# Patient Record
Sex: Female | Born: 1945 | Race: White | Hispanic: No | Marital: Married | Smoking: Never smoker
Health system: Southern US, Community
[De-identification: ages and names within clinical notes are randomized; demographics above are authoritative.]

## PROBLEM LIST (undated history)

## (undated) DIAGNOSIS — D649 Anemia, unspecified: Secondary | ICD-10-CM

## (undated) DIAGNOSIS — E7800 Pure hypercholesterolemia, unspecified: Secondary | ICD-10-CM

## (undated) DIAGNOSIS — R0602 Shortness of breath: Secondary | ICD-10-CM

## (undated) DIAGNOSIS — I10 Essential (primary) hypertension: Secondary | ICD-10-CM

## (undated) DIAGNOSIS — K219 Gastro-esophageal reflux disease without esophagitis: Secondary | ICD-10-CM

## (undated) DIAGNOSIS — D0510 Intraductal carcinoma in situ of unspecified breast: Secondary | ICD-10-CM

## (undated) DIAGNOSIS — A4902 Methicillin resistant Staphylococcus aureus infection, unspecified site: Secondary | ICD-10-CM

## (undated) DIAGNOSIS — M549 Dorsalgia, unspecified: Secondary | ICD-10-CM

## (undated) DIAGNOSIS — C50919 Malignant neoplasm of unspecified site of unspecified female breast: Secondary | ICD-10-CM

## (undated) DIAGNOSIS — E039 Hypothyroidism, unspecified: Secondary | ICD-10-CM

## (undated) DIAGNOSIS — Z923 Personal history of irradiation: Secondary | ICD-10-CM

## (undated) DIAGNOSIS — E119 Type 2 diabetes mellitus without complications: Secondary | ICD-10-CM

## (undated) DIAGNOSIS — E222 Syndrome of inappropriate secretion of antidiuretic hormone: Secondary | ICD-10-CM

## (undated) DIAGNOSIS — R000 Tachycardia, unspecified: Secondary | ICD-10-CM

## (undated) DIAGNOSIS — J45909 Unspecified asthma, uncomplicated: Secondary | ICD-10-CM

## (undated) DIAGNOSIS — S299XXA Unspecified injury of thorax, initial encounter: Secondary | ICD-10-CM

## (undated) DIAGNOSIS — E063 Autoimmune thyroiditis: Secondary | ICD-10-CM

## (undated) DIAGNOSIS — E669 Obesity, unspecified: Secondary | ICD-10-CM

## (undated) HISTORY — PX: MASTECTOMY, PARTIAL: SHX709

## (undated) HISTORY — DX: Anemia, unspecified: D64.9

## (undated) HISTORY — PX: COLONOSCOPY WITH ESOPHAGOGASTRODUODENOSCOPY (EGD): SHX5779

## (undated) HISTORY — DX: Dorsalgia, unspecified: M54.9

## (undated) HISTORY — DX: Unspecified asthma, uncomplicated: J45.909

## (undated) HISTORY — PX: OTHER SURGICAL HISTORY: SHX169

## (undated) HISTORY — DX: Type 2 diabetes mellitus without complications: E11.9

## (undated) HISTORY — DX: Gastro-esophageal reflux disease without esophagitis: K21.9

## (undated) HISTORY — PX: THYROIDECTOMY, PARTIAL: SHX18

## (undated) HISTORY — DX: Essential (primary) hypertension: I10

## (undated) HISTORY — DX: Tachycardia, unspecified: R00.0

## (undated) HISTORY — DX: Malignant neoplasm of unspecified site of unspecified female breast: C50.919

## (undated) HISTORY — DX: Hypothyroidism, unspecified: E03.9

## (undated) HISTORY — PX: ABDOMINAL HYSTERECTOMY: SHX81

## (undated) HISTORY — PX: REPLACEMENT TOTAL KNEE: SUR1224

## (undated) HISTORY — PX: MASTECTOMY: SHX3

## (undated) HISTORY — PX: TONSILLECTOMY: SUR1361

---

## 2005-11-04 ENCOUNTER — Ambulatory Visit: Payer: Self-pay

## 2006-01-27 ENCOUNTER — Ambulatory Visit: Payer: Self-pay | Admitting: Unknown Physician Specialty

## 2007-10-26 ENCOUNTER — Ambulatory Visit: Payer: Self-pay

## 2008-12-11 ENCOUNTER — Emergency Department: Payer: Self-pay | Admitting: Emergency Medicine

## 2010-09-12 ENCOUNTER — Emergency Department: Payer: Self-pay | Admitting: Emergency Medicine

## 2011-02-27 ENCOUNTER — Observation Stay: Payer: Self-pay | Admitting: Internal Medicine

## 2011-02-27 DIAGNOSIS — I369 Nonrheumatic tricuspid valve disorder, unspecified: Secondary | ICD-10-CM

## 2011-02-27 DIAGNOSIS — R079 Chest pain, unspecified: Secondary | ICD-10-CM

## 2011-04-07 ENCOUNTER — Ambulatory Visit: Payer: Self-pay | Admitting: General Practice

## 2011-04-09 ENCOUNTER — Ambulatory Visit: Payer: Self-pay | Admitting: Unknown Physician Specialty

## 2011-04-21 ENCOUNTER — Inpatient Hospital Stay: Payer: Self-pay | Admitting: General Practice

## 2011-04-25 ENCOUNTER — Encounter: Payer: Self-pay | Admitting: Internal Medicine

## 2011-04-29 ENCOUNTER — Encounter: Payer: Self-pay | Admitting: Internal Medicine

## 2011-05-10 ENCOUNTER — Emergency Department: Payer: Self-pay | Admitting: Emergency Medicine

## 2011-10-14 ENCOUNTER — Ambulatory Visit: Payer: Self-pay | Admitting: Internal Medicine

## 2012-09-07 ENCOUNTER — Ambulatory Visit: Payer: Self-pay | Admitting: General Practice

## 2012-09-07 DIAGNOSIS — I10 Essential (primary) hypertension: Secondary | ICD-10-CM

## 2012-09-07 LAB — BASIC METABOLIC PANEL
Anion Gap: 11 (ref 7–16)
BUN: 13 mg/dL (ref 7–18)
Chloride: 96 mmol/L — ABNORMAL LOW (ref 98–107)
Creatinine: 0.71 mg/dL (ref 0.60–1.30)
EGFR (African American): >60
Glucose: 160 mg/dL — ABNORMAL HIGH (ref 65–99)
Osmolality: 266 (ref 275–301)
Sodium: 131 mmol/L — ABNORMAL LOW (ref 136–145)

## 2012-09-07 LAB — URINALYSIS, COMPLETE
Bacteria: NONE SEEN
Glucose,UR: NEGATIVE mg/dL (ref 0–75)
Nitrite: NEGATIVE
Specific Gravity: 1.014 (ref 1.003–1.030)
Squamous Epithelial: <1
WBC UR: 1 /HPF (ref 0–5)

## 2012-09-07 LAB — CBC
HCT: 32.7 % — ABNORMAL LOW (ref 35.0–47.0)
HGB: 11.3 g/dL — ABNORMAL LOW (ref 12.0–16.0)
MCV: 81 fL (ref 80–100)
RBC: 4.03 10*6/uL (ref 3.80–5.20)
RDW: 12.7 % (ref 11.5–14.5)
WBC: 7.4 10*3/uL (ref 3.6–11.0)

## 2012-09-07 LAB — PROTIME-INR: INR: 0.9

## 2012-09-07 LAB — APTT: Activated PTT: 29.6 secs (ref 23.6–35.9)

## 2012-09-07 LAB — MRSA PCR SCREENING

## 2012-09-20 ENCOUNTER — Inpatient Hospital Stay: Payer: Self-pay | Admitting: General Practice

## 2012-09-21 LAB — BASIC METABOLIC PANEL
Anion Gap: 10 (ref 7–16)
BUN: 9 mg/dL (ref 7–18)
Chloride: 102 mmol/L (ref 98–107)
Creatinine: 0.67 mg/dL (ref 0.60–1.30)
EGFR (African American): >60
EGFR (Non-African Amer.): >60
Glucose: 118 mg/dL — ABNORMAL HIGH (ref 65–99)
Osmolality: 272 (ref 275–301)

## 2012-09-21 LAB — PLATELET COUNT: Platelet: 296 10*3/uL (ref 150–440)

## 2012-09-21 LAB — HEMOGLOBIN: HGB: 9.6 g/dL — ABNORMAL LOW (ref 12.0–16.0)

## 2012-09-22 LAB — BASIC METABOLIC PANEL
Anion Gap: 9 (ref 7–16)
BUN: 5 mg/dL — ABNORMAL LOW (ref 7–18)
Calcium, Total: 8.8 mg/dL (ref 8.5–10.1)
Chloride: 103 mmol/L (ref 98–107)
EGFR (African American): >60
EGFR (Non-African Amer.): >60
Glucose: 113 mg/dL — ABNORMAL HIGH (ref 65–99)
Osmolality: 274 (ref 275–301)

## 2012-09-22 LAB — PLATELET COUNT: Platelet: 318 10*3/uL (ref 150–440)

## 2012-09-24 ENCOUNTER — Encounter: Payer: Self-pay | Admitting: Internal Medicine

## 2012-09-28 ENCOUNTER — Encounter: Payer: Self-pay | Admitting: Internal Medicine

## 2012-11-03 ENCOUNTER — Ambulatory Visit: Payer: Self-pay | Admitting: General Practice

## 2012-11-22 ENCOUNTER — Ambulatory Visit: Payer: Self-pay | Admitting: Internal Medicine

## 2012-12-29 DIAGNOSIS — C50919 Malignant neoplasm of unspecified site of unspecified female breast: Secondary | ICD-10-CM

## 2012-12-29 HISTORY — DX: Malignant neoplasm of unspecified site of unspecified female breast: C50.919

## 2013-04-20 ENCOUNTER — Inpatient Hospital Stay: Payer: Self-pay | Admitting: Internal Medicine

## 2013-04-20 LAB — CBC
HCT: 38.4 % (ref 35.0–47.0)
MCH: 28.3 pg (ref 26.0–34.0)
MCV: 85 fL (ref 80–100)
RBC: 4.5 10*6/uL (ref 3.80–5.20)
RDW: 12.5 % (ref 11.5–14.5)
WBC: 9.1 10*3/uL (ref 3.6–11.0)

## 2013-04-20 LAB — COMPREHENSIVE METABOLIC PANEL
Albumin: 3.5 g/dL (ref 3.4–5.0)
Alkaline Phosphatase: 109 U/L (ref 50–136)
BUN: 19 mg/dL — ABNORMAL HIGH (ref 7–18)
Calcium, Total: 8.7 mg/dL (ref 8.5–10.1)
Chloride: 95 mmol/L — ABNORMAL LOW (ref 98–107)
Co2: 25 mmol/L (ref 21–32)
EGFR (Non-African Amer.): 44 — ABNORMAL LOW
Potassium: 3.6 mmol/L (ref 3.5–5.1)
SGOT(AST): 20 U/L (ref 15–37)
Sodium: 129 mmol/L — ABNORMAL LOW (ref 136–145)
Total Protein: 7.7 g/dL (ref 6.4–8.2)

## 2013-04-20 LAB — URINALYSIS, COMPLETE
Bacteria: NONE SEEN
Bilirubin,UR: NEGATIVE
Blood: NEGATIVE
Glucose,UR: >=500 mg/dL (ref 0–75)
Ketone: NEGATIVE
Nitrite: NEGATIVE
Ph: 5 (ref 4.5–8.0)
Protein: NEGATIVE
Specific Gravity: 1.027 (ref 1.003–1.030)
WBC UR: 6 /HPF (ref 0–5)

## 2013-04-20 LAB — BETA-HYDROXYBUTYRIC ACID: Beta-Hydroxybutyrate: 1.95 mg/dL (ref 0.2–2.8)

## 2013-04-21 LAB — CBC WITH DIFFERENTIAL/PLATELET
Eosinophil #: 0.4 10*3/uL (ref 0.0–0.7)
Eosinophil %: 5.8 %
HGB: 11.9 g/dL — ABNORMAL LOW (ref 12.0–16.0)
Lymphocyte %: 35.2 %
MCH: 28.1 pg (ref 26.0–34.0)
Monocyte #: 0.5 x10 3/mm (ref 0.2–0.9)
Neutrophil #: 3.8 10*3/uL (ref 1.4–6.5)
Platelet: 251 10*3/uL (ref 150–440)
RDW: 12.4 % (ref 11.5–14.5)

## 2013-04-21 LAB — BASIC METABOLIC PANEL
Anion Gap: 5 — ABNORMAL LOW (ref 7–16)
Chloride: 106 mmol/L (ref 98–107)
Co2: 29 mmol/L (ref 21–32)
Creatinine: 0.63 mg/dL (ref 0.60–1.30)
EGFR (African American): >60
EGFR (Non-African Amer.): >60
Osmolality: 282 (ref 275–301)
Potassium: 3.1 mmol/L — ABNORMAL LOW (ref 3.5–5.1)
Sodium: 140 mmol/L (ref 136–145)

## 2013-04-21 LAB — MAGNESIUM: Magnesium: 1.3 mg/dL — ABNORMAL LOW

## 2013-04-21 LAB — HEMOGLOBIN A1C: Hemoglobin A1C: 12.3 % — ABNORMAL HIGH (ref 4.2–6.3)

## 2013-04-22 LAB — BASIC METABOLIC PANEL
Anion Gap: 6 — ABNORMAL LOW (ref 7–16)
Calcium, Total: 8.6 mg/dL (ref 8.5–10.1)
Chloride: 109 mmol/L — ABNORMAL HIGH (ref 98–107)
Co2: 25 mmol/L (ref 21–32)
EGFR (African American): >60
EGFR (Non-African Amer.): >60
Potassium: 3.8 mmol/L (ref 3.5–5.1)

## 2013-05-13 ENCOUNTER — Ambulatory Visit: Payer: Self-pay | Admitting: Internal Medicine

## 2013-05-29 ENCOUNTER — Ambulatory Visit: Payer: Self-pay | Admitting: Internal Medicine

## 2013-07-27 ENCOUNTER — Ambulatory Visit: Payer: Self-pay | Admitting: Internal Medicine

## 2013-07-29 ENCOUNTER — Ambulatory Visit: Payer: Self-pay | Admitting: Internal Medicine

## 2013-08-29 ENCOUNTER — Ambulatory Visit: Payer: Self-pay | Admitting: Internal Medicine

## 2013-09-19 ENCOUNTER — Ambulatory Visit: Payer: Self-pay | Admitting: Otolaryngology

## 2013-10-03 ENCOUNTER — Ambulatory Visit: Payer: Self-pay | Admitting: Otolaryngology

## 2013-11-23 ENCOUNTER — Ambulatory Visit: Payer: Self-pay | Admitting: Internal Medicine

## 2013-11-28 HISTORY — PX: BREAST BIOPSY: SHX20

## 2013-12-05 ENCOUNTER — Ambulatory Visit: Payer: Self-pay | Admitting: Internal Medicine

## 2013-12-12 ENCOUNTER — Ambulatory Visit: Payer: Self-pay | Admitting: Internal Medicine

## 2013-12-16 LAB — PATHOLOGY REPORT

## 2013-12-27 ENCOUNTER — Ambulatory Visit: Payer: Self-pay | Admitting: Surgery

## 2013-12-27 LAB — CBC
HCT: 34.1 % — ABNORMAL LOW (ref 35.0–47.0)
HGB: 11.6 g/dL — ABNORMAL LOW (ref 12.0–16.0)
MCHC: 34.1 g/dL (ref 32.0–36.0)
MCV: 81 fL (ref 80–100)
Platelet: 368 10*3/uL (ref 150–440)
RBC: 4.22 10*6/uL (ref 3.80–5.20)
RDW: 12.6 % (ref 11.5–14.5)

## 2013-12-27 LAB — BASIC METABOLIC PANEL
Anion Gap: 6 — ABNORMAL LOW (ref 7–16)
BUN: 14 mg/dL (ref 7–18)
Calcium, Total: 9.3 mg/dL (ref 8.5–10.1)
Chloride: 95 mmol/L — ABNORMAL LOW (ref 98–107)
Co2: 27 mmol/L (ref 21–32)
Creatinine: 0.81 mg/dL (ref 0.60–1.30)
Potassium: 3.5 mmol/L (ref 3.5–5.1)

## 2013-12-29 DIAGNOSIS — Z923 Personal history of irradiation: Secondary | ICD-10-CM

## 2013-12-29 HISTORY — DX: Personal history of irradiation: Z92.3

## 2013-12-29 HISTORY — PX: BREAST LUMPECTOMY: SHX2

## 2014-01-05 ENCOUNTER — Ambulatory Visit: Payer: Self-pay | Admitting: Surgery

## 2014-01-05 DIAGNOSIS — D0511 Intraductal carcinoma in situ of right breast: Secondary | ICD-10-CM | POA: Insufficient documentation

## 2014-01-06 ENCOUNTER — Emergency Department: Payer: Self-pay | Admitting: Emergency Medicine

## 2014-01-09 LAB — PATHOLOGY REPORT

## 2014-01-12 ENCOUNTER — Ambulatory Visit: Payer: Self-pay | Admitting: Hematology and Oncology

## 2014-01-17 ENCOUNTER — Ambulatory Visit: Payer: Self-pay | Admitting: Hematology and Oncology

## 2014-01-29 ENCOUNTER — Ambulatory Visit: Payer: Self-pay | Admitting: Hematology and Oncology

## 2014-02-20 LAB — CBC CANCER CENTER
BASOS PCT: 0.7 %
Basophil #: 0.1 x10 3/mm (ref 0.0–0.1)
Eosinophil #: 0.2 x10 3/mm (ref 0.0–0.7)
Eosinophil %: 2.5 %
HCT: 35.4 % (ref 35.0–47.0)
HGB: 11.7 g/dL — ABNORMAL LOW (ref 12.0–16.0)
Lymphocyte #: 1.6 x10 3/mm (ref 1.0–3.6)
Lymphocyte %: 16 %
MCH: 26.9 pg (ref 26.0–34.0)
MCHC: 33.1 g/dL (ref 32.0–36.0)
MCV: 81 fL (ref 80–100)
MONOS PCT: 6.3 %
Monocyte #: 0.6 x10 3/mm (ref 0.2–0.9)
NEUTROS ABS: 7.3 x10 3/mm — AB (ref 1.4–6.5)
Neutrophil %: 74.5 %
Platelet: 359 x10 3/mm (ref 150–440)
RBC: 4.35 10*6/uL (ref 3.80–5.20)
RDW: 12.7 % (ref 11.5–14.5)
WBC: 9.8 x10 3/mm (ref 3.6–11.0)

## 2014-02-26 ENCOUNTER — Ambulatory Visit: Payer: Self-pay | Admitting: Hematology and Oncology

## 2014-02-27 LAB — CBC CANCER CENTER
Basophil #: 0.1 x10 3/mm (ref 0.0–0.1)
Basophil %: 0.9 %
EOS PCT: 4.3 %
Eosinophil #: 0.4 x10 3/mm (ref 0.0–0.7)
HCT: 37.2 % (ref 35.0–47.0)
HGB: 12.3 g/dL (ref 12.0–16.0)
LYMPHS ABS: 1.8 x10 3/mm (ref 1.0–3.6)
Lymphocyte %: 18.3 %
MCH: 26.9 pg (ref 26.0–34.0)
MCHC: 33.2 g/dL (ref 32.0–36.0)
MCV: 81 fL (ref 80–100)
Monocyte #: 0.8 x10 3/mm (ref 0.2–0.9)
Monocyte %: 8 %
NEUTROS PCT: 68.5 %
Neutrophil #: 6.7 x10 3/mm — ABNORMAL HIGH (ref 1.4–6.5)
Platelet: 405 x10 3/mm (ref 150–440)
RBC: 4.59 10*6/uL (ref 3.80–5.20)
RDW: 12.9 % (ref 11.5–14.5)
WBC: 9.8 x10 3/mm (ref 3.6–11.0)

## 2014-02-28 LAB — COMPREHENSIVE METABOLIC PANEL
AST: 11 U/L — AB (ref 15–37)
Albumin: 3.5 g/dL (ref 3.4–5.0)
Alkaline Phosphatase: 64 U/L
Anion Gap: 10 (ref 7–16)
BUN: 14 mg/dL (ref 7–18)
Bilirubin,Total: 0.3 mg/dL (ref 0.2–1.0)
CALCIUM: 8.6 mg/dL (ref 8.5–10.1)
Chloride: 91 mmol/L — ABNORMAL LOW (ref 98–107)
Co2: 27 mmol/L (ref 21–32)
Creatinine: 0.86 mg/dL (ref 0.60–1.30)
EGFR (African American): >60
EGFR (Non-African Amer.): >60
Glucose: 120 mg/dL — ABNORMAL HIGH (ref 65–99)
Osmolality: 259 (ref 275–301)
Potassium: 4.3 mmol/L (ref 3.5–5.1)
SGPT (ALT): 14 U/L (ref 12–78)
Sodium: 128 mmol/L — ABNORMAL LOW (ref 136–145)
TOTAL PROTEIN: 7.9 g/dL (ref 6.4–8.2)

## 2014-03-06 DIAGNOSIS — IMO0002 Reserved for concepts with insufficient information to code with codable children: Secondary | ICD-10-CM | POA: Insufficient documentation

## 2014-03-06 DIAGNOSIS — E119 Type 2 diabetes mellitus without complications: Secondary | ICD-10-CM | POA: Insufficient documentation

## 2014-03-06 DIAGNOSIS — E041 Nontoxic single thyroid nodule: Secondary | ICD-10-CM | POA: Insufficient documentation

## 2014-03-06 DIAGNOSIS — M1710 Unilateral primary osteoarthritis, unspecified knee: Secondary | ICD-10-CM | POA: Insufficient documentation

## 2014-03-06 DIAGNOSIS — I10 Essential (primary) hypertension: Secondary | ICD-10-CM | POA: Insufficient documentation

## 2014-03-06 DIAGNOSIS — E7800 Pure hypercholesterolemia, unspecified: Secondary | ICD-10-CM | POA: Insufficient documentation

## 2014-03-06 DIAGNOSIS — E039 Hypothyroidism, unspecified: Secondary | ICD-10-CM | POA: Insufficient documentation

## 2014-03-06 DIAGNOSIS — D510 Vitamin B12 deficiency anemia due to intrinsic factor deficiency: Secondary | ICD-10-CM | POA: Insufficient documentation

## 2014-03-06 DIAGNOSIS — Z794 Long term (current) use of insulin: Secondary | ICD-10-CM

## 2014-03-06 DIAGNOSIS — D649 Anemia, unspecified: Secondary | ICD-10-CM | POA: Insufficient documentation

## 2014-03-06 LAB — CBC CANCER CENTER
Basophil #: 0.1 x10 3/mm (ref 0.0–0.1)
Basophil %: 0.6 %
EOS ABS: 0.4 x10 3/mm (ref 0.0–0.7)
Eosinophil %: 4.4 %
HCT: 36.3 % (ref 35.0–47.0)
HGB: 12.2 g/dL (ref 12.0–16.0)
LYMPHS ABS: 1.4 x10 3/mm (ref 1.0–3.6)
Lymphocyte %: 14.8 %
MCH: 27.3 pg (ref 26.0–34.0)
MCHC: 33.6 g/dL (ref 32.0–36.0)
MCV: 81 fL (ref 80–100)
Monocyte #: 0.6 x10 3/mm (ref 0.2–0.9)
Monocyte %: 6.9 %
NEUTROS ABS: 6.7 x10 3/mm — AB (ref 1.4–6.5)
Neutrophil %: 73.3 %
Platelet: 362 x10 3/mm (ref 150–440)
RBC: 4.46 10*6/uL (ref 3.80–5.20)
RDW: 12.9 % (ref 11.5–14.5)
WBC: 9.1 x10 3/mm (ref 3.6–11.0)

## 2014-03-13 LAB — CBC CANCER CENTER
Basophil #: 0.1 x10 3/mm (ref 0.0–0.1)
Basophil %: 0.7 %
EOS PCT: 2.5 %
Eosinophil #: 0.2 x10 3/mm (ref 0.0–0.7)
HCT: 35.5 % (ref 35.0–47.0)
HGB: 12 g/dL (ref 12.0–16.0)
Lymphocyte #: 1.3 x10 3/mm (ref 1.0–3.6)
Lymphocyte %: 13.7 %
MCH: 27.7 pg (ref 26.0–34.0)
MCHC: 33.8 g/dL (ref 32.0–36.0)
MCV: 82 fL (ref 80–100)
MONO ABS: 0.6 x10 3/mm (ref 0.2–0.9)
MONOS PCT: 6.4 %
NEUTROS ABS: 7.2 x10 3/mm — AB (ref 1.4–6.5)
Neutrophil %: 76.7 %
Platelet: 308 x10 3/mm (ref 150–440)
RBC: 4.33 10*6/uL (ref 3.80–5.20)
RDW: 13 % (ref 11.5–14.5)
WBC: 9.4 x10 3/mm (ref 3.6–11.0)

## 2014-03-20 LAB — CBC CANCER CENTER
BASOS PCT: 0.5 %
Basophil #: 0 x10 3/mm (ref 0.0–0.1)
EOS ABS: 0.2 x10 3/mm (ref 0.0–0.7)
EOS PCT: 2.4 %
HCT: 36.8 % (ref 35.0–47.0)
HGB: 12.3 g/dL (ref 12.0–16.0)
LYMPHS ABS: 1.1 x10 3/mm (ref 1.0–3.6)
Lymphocyte %: 12.6 %
MCH: 27.2 pg (ref 26.0–34.0)
MCHC: 33.3 g/dL (ref 32.0–36.0)
MCV: 82 fL (ref 80–100)
MONO ABS: 0.6 x10 3/mm (ref 0.2–0.9)
Monocyte %: 7.2 %
NEUTROS ABS: 6.8 x10 3/mm — AB (ref 1.4–6.5)
NEUTROS PCT: 77.3 %
Platelet: 326 x10 3/mm (ref 150–440)
RBC: 4.52 10*6/uL (ref 3.80–5.20)
RDW: 12.9 % (ref 11.5–14.5)
WBC: 8.8 x10 3/mm (ref 3.6–11.0)

## 2014-03-27 LAB — CBC CANCER CENTER
BASOS PCT: 0.8 %
Basophil #: 0.1 x10 3/mm (ref 0.0–0.1)
Eosinophil #: 0.2 x10 3/mm (ref 0.0–0.7)
Eosinophil %: 2.7 %
HCT: 35.9 % (ref 35.0–47.0)
HGB: 12 g/dL (ref 12.0–16.0)
Lymphocyte #: 1.1 x10 3/mm (ref 1.0–3.6)
Lymphocyte %: 13.7 %
MCH: 27 pg (ref 26.0–34.0)
MCHC: 33.3 g/dL (ref 32.0–36.0)
MCV: 81 fL (ref 80–100)
MONOS PCT: 7.8 %
Monocyte #: 0.7 x10 3/mm (ref 0.2–0.9)
NEUTROS ABS: 6.3 x10 3/mm (ref 1.4–6.5)
Neutrophil %: 75 %
Platelet: 341 x10 3/mm (ref 150–440)
RBC: 4.43 10*6/uL (ref 3.80–5.20)
RDW: 12.8 % (ref 11.5–14.5)
WBC: 8.4 x10 3/mm (ref 3.6–11.0)

## 2014-03-29 ENCOUNTER — Ambulatory Visit: Payer: Self-pay | Admitting: Hematology and Oncology

## 2014-04-18 LAB — CBC CANCER CENTER
Basophil #: 0.1 10*3/uL
Basophil %: 0.9 %
Eosinophil #: 0.2 10*3/uL
Eosinophil %: 2.2 %
HCT: 36.6 %
HGB: 12.3 g/dL
Lymphocyte %: 15.2 %
Lymphs Abs: 1.1 10*3/uL
MCH: 27.8 pg
MCHC: 33.6 g/dL
MCV: 83 fL
Monocyte #: 0.6 10*3/uL
Monocyte %: 7.4 %
Neutrophil #: 5.6 10*3/uL
Neutrophil %: 74.3 %
Platelet: 341 10*3/uL
RBC: 4.43 10*6/uL
RDW: 12.7 %
WBC: 7.5 10*3/uL

## 2014-04-18 LAB — COMPREHENSIVE METABOLIC PANEL
ANION GAP: 6 — AB (ref 7–16)
AST: 11 U/L — AB (ref 15–37)
Albumin: 3.5 g/dL (ref 3.4–5.0)
Alkaline Phosphatase: 74 U/L
BUN: 14 mg/dL (ref 7–18)
Bilirubin,Total: 0.4 mg/dL (ref 0.2–1.0)
Calcium, Total: 9.1 mg/dL (ref 8.5–10.1)
Chloride: 93 mmol/L — ABNORMAL LOW (ref 98–107)
Co2: 28 mmol/L (ref 21–32)
Creatinine: 0.66 mg/dL (ref 0.60–1.30)
EGFR (African American): >60
Glucose: 178 mg/dL — ABNORMAL HIGH (ref 65–99)
Osmolality: 260 (ref 275–301)
POTASSIUM: 3.9 mmol/L (ref 3.5–5.1)
SGPT (ALT): 18 U/L (ref 12–78)
SODIUM: 127 mmol/L — AB (ref 136–145)
TOTAL PROTEIN: 8.2 g/dL (ref 6.4–8.2)

## 2014-04-28 ENCOUNTER — Ambulatory Visit: Payer: Self-pay | Admitting: Hematology and Oncology

## 2014-05-15 ENCOUNTER — Ambulatory Visit: Payer: Self-pay | Admitting: Neurology

## 2014-08-18 ENCOUNTER — Ambulatory Visit: Payer: Self-pay | Admitting: Family Medicine

## 2014-08-18 LAB — CBC CANCER CENTER
BASOS ABS: 0.1 x10 3/mm (ref 0.0–0.1)
Basophil %: 0.7 %
EOS ABS: 0.2 x10 3/mm (ref 0.0–0.7)
Eosinophil %: 2.3 %
HCT: 35.3 % (ref 35.0–47.0)
HGB: 12.1 g/dL (ref 12.0–16.0)
LYMPHS ABS: 1.4 x10 3/mm (ref 1.0–3.6)
LYMPHS PCT: 17.2 %
MCH: 27.9 pg (ref 26.0–34.0)
MCHC: 34.2 g/dL (ref 32.0–36.0)
MCV: 82 fL (ref 80–100)
Monocyte #: 0.6 x10 3/mm (ref 0.2–0.9)
Monocyte %: 7.7 %
NEUTROS PCT: 72.1 %
Neutrophil #: 6 x10 3/mm (ref 1.4–6.5)
Platelet: 388 x10 3/mm (ref 150–440)
RBC: 4.32 10*6/uL (ref 3.80–5.20)
RDW: 12.5 % (ref 11.5–14.5)
WBC: 8.3 x10 3/mm (ref 3.6–11.0)

## 2014-08-18 LAB — BASIC METABOLIC PANEL
Anion Gap: 9 (ref 7–16)
BUN: 12 mg/dL (ref 7–18)
CO2: 27 mmol/L (ref 21–32)
Calcium, Total: 8.8 mg/dL (ref 8.5–10.1)
Chloride: 91 mmol/L — ABNORMAL LOW (ref 98–107)
Creatinine: 0.75 mg/dL (ref 0.60–1.30)
EGFR (African American): >60
EGFR (Non-African Amer.): >60
Glucose: 131 mg/dL — ABNORMAL HIGH (ref 65–99)
Osmolality: 257 (ref 275–301)
Potassium: 4.3 mmol/L (ref 3.5–5.1)
Sodium: 127 mmol/L — ABNORMAL LOW (ref 136–145)

## 2014-08-29 ENCOUNTER — Ambulatory Visit: Payer: Self-pay | Admitting: Family Medicine

## 2014-11-14 ENCOUNTER — Ambulatory Visit: Payer: Self-pay | Admitting: Hematology and Oncology

## 2014-11-28 ENCOUNTER — Ambulatory Visit: Payer: Self-pay | Admitting: Hematology and Oncology

## 2014-12-13 ENCOUNTER — Ambulatory Visit: Payer: Self-pay | Admitting: Internal Medicine

## 2014-12-15 LAB — COMPREHENSIVE METABOLIC PANEL
Albumin: 3.3 g/dL — ABNORMAL LOW (ref 3.4–5.0)
Alkaline Phosphatase: 72 U/L
Anion Gap: 5 — ABNORMAL LOW (ref 7–16)
BILIRUBIN TOTAL: 0.3 mg/dL (ref 0.2–1.0)
BUN: 16 mg/dL (ref 7–18)
CALCIUM: 8.6 mg/dL (ref 8.5–10.1)
CHLORIDE: 100 mmol/L (ref 98–107)
CO2: 29 mmol/L (ref 21–32)
Creatinine: 0.79 mg/dL (ref 0.60–1.30)
EGFR (Non-African Amer.): >60
GLUCOSE: 195 mg/dL — AB (ref 65–99)
OSMOLALITY: 275 (ref 275–301)
POTASSIUM: 4.3 mmol/L (ref 3.5–5.1)
SGOT(AST): 9 U/L — ABNORMAL LOW (ref 15–37)
SGPT (ALT): 14 U/L
SODIUM: 134 mmol/L — AB (ref 136–145)
TOTAL PROTEIN: 7.3 g/dL (ref 6.4–8.2)

## 2014-12-15 LAB — CBC CANCER CENTER
Basophil #: 0.1 x10 3/mm (ref 0.0–0.1)
Basophil %: 0.8 %
EOS PCT: 3.3 %
Eosinophil #: 0.2 x10 3/mm (ref 0.0–0.7)
HCT: 34.9 % — AB (ref 35.0–47.0)
HGB: 11.5 g/dL — ABNORMAL LOW (ref 12.0–16.0)
LYMPHS PCT: 22.5 %
Lymphocyte #: 1.6 x10 3/mm (ref 1.0–3.6)
MCH: 27.6 pg (ref 26.0–34.0)
MCHC: 33 g/dL (ref 32.0–36.0)
MCV: 84 fL (ref 80–100)
MONO ABS: 0.5 x10 3/mm (ref 0.2–0.9)
MONOS PCT: 6.7 %
NEUTROS ABS: 4.8 x10 3/mm (ref 1.4–6.5)
Neutrophil %: 66.7 %
Platelet: 267 x10 3/mm (ref 150–440)
RBC: 4.17 10*6/uL (ref 3.80–5.20)
RDW: 12.6 % (ref 11.5–14.5)
WBC: 7.2 x10 3/mm (ref 3.6–11.0)

## 2014-12-18 LAB — CANCER ANTIGEN 27.29: CA 27.29: 15.2 U/mL (ref 0.0–38.6)

## 2014-12-29 ENCOUNTER — Ambulatory Visit: Payer: Self-pay | Admitting: Hematology and Oncology

## 2015-04-17 NOTE — Discharge Summary (Signed)
PATIENT NAME:  Hailey Hawkins, Hailey Hawkins MR#:  [id number] DATE OF BIRTH:  1946/08/24  DATE OF ADMISSION:  09/20/2012 DATE OF DISCHARGE:  09/23/2012  ADMITTING DIAGNOSIS: Degenerative arthrosis of the right knee.   DISCHARGE DIAGNOSIS: Degenerative arthrosis of the right knee.   HISTORY: The patient is a pleasant 69 year old who has been followed at [facility] for progression of right knee pain. The patient had reported long-standing history of bilateral knee pain. She had previously undergone a left total knee arthroplasty and had done well and was desiring to have the right one done. She denied any significant swelling in the knee, locking, or giving way. Most for her pain was localized along the medial aspect of the knee. The right knee pain had progressed to the point that it was significantly interfering with her activities of daily living. Her pain was noted be aggravated with weight-bearing activities. She was having difficulty with ascending and descending stairs. X-rays taken in [facility] showed significant narrowing to the medial cartilage space. There was subchondral sclerosis as well as osteophyte formation. After discussion of the risks and benefits of surgical intervention, the patient expressed her understanding of the risks and benefits and agreed with plans for surgical intervention.   PROCEDURE: Right total knee arthroplasty using computer-assisted navigation.   ANESTHESIA: Femoral nerve block with spinal.   IMPLANTS UTILIZED: DePuy PFC Sigma size 3 posterior stabilized femoral component (cemented), size 4 MBT revision tibial component (cemented), 35 mm three pegged oval dome patella (cemented), and a 10 mm stabilized rotating platform polyethylene insert.   HOSPITAL COURSE: The patient tolerated the procedure very well. She had no complications. She was then taken to the PAC-U where she was stabilized and then transferred to the orthopedic floor. The patient began  receiving anticoagulation therapy of Lovenox 40 mg every 12 hours per anesthesia and pharmacy protocol. She was fitted with TED stockings bilaterally. These were allowed to be removed one hour per eight hour shift. The right one was applied on day two following removal of the Hemovac and dressing change. She was also fitted with the AV-I compression foot pumps bilaterally set at 80 mmHg. Her calves have been nontender. There has been no evidence of any deep venous thromboses to the lower extremities. Heels were elevated off the bed using rolled towels. She has denied chest pain or shortness of breath.   Vital signs have been stable. She has been afebrile. Hemodynamically she was stable and no transfusions were given other than the Autovac transfusion given the first six hours postoperatively. Laboratory studies were felt to be at acceptable levels.  Physical therapy was initiated on day one for gait training and transfers. She has done very well and has progressed nicely. Occupational therapy was also initiated on day one for activities of daily living and assistive devices.   The patient's IV, Foley and Hemovac were discontinued on day two along with a dressing change. The wound was free of any drainage or signs of infection. Polar Care was reapplied to the surgical leg maintaining a temperature of 40 to 50 degrees Fahrenheit.   DISPOSITION: The patient is being discharged to a skilled nursing facility in improved stable condition.   DISCHARGE INSTRUCTIONS: She may weight bear as tolerated. Knee immobilizer until she is able to do 10 straight leg raises on her own. Continue TED stockings. These are allowed to be removed one hour per eight hour shift. Incentive spirometer every 1 hour while awake. Encourage cough and deep breathing every  two hours while awake. Polar Care to the surgical leg maintaining a temperature of 40 to 50 degrees Fahrenheit. Physical therapy for gait training and transfers.  Occupational therapy for activities of daily living and assistive devices. She is placed on an ADA diet. She has a follow-up appointment in the [facility] on 10/05/2012 at 11:00 a.m. with Vance Peper and on 11/02/2012 at 9:15 with Dr. Skip Estimable. She is to call the clinic sooner if there are any complications.   DRUG ALLERGIES: Birth control pills cause blood clots, amoxicillin, Cipro, codeine, penicillin, and animal dander-cat.  MEDICATIONS:  1. Tylenol 500 to 1000 mg every four hours p.r.n. for temperature greater than 100.4.  2. Roxicodone 5 to 10 mg every four hours p.r.n. for pain. 3. Tramadol 50 to 100 mg every four hours p.r.n. for pain. 4. Dulcolax suppository 10 mg rectally daily p.r.n. for constipation.  5. Milk of Magnesia 30 mL twice a day p.r.n.  6. Enema soapsuds if no results with milk of magnesia or Dulcolax.  7. Mylanta DS 30 mL every 6 hours p.r.n.  8. Senokot-S 1 tablet twice a day. 9. Glimepiride 2 mg with breakfast.  10. Insulin sliding scale Novolin R injections.  11. Advair Diskus 250/50 inhaler. 12. Synthroid 0.1 mg every 6:00 a.m.  13. Alprazolam 0.5 mg at bedtime.  14. Carafate 1 gram four times daily before meals.  15. Ferrous sulfate 325 mg twice a day with meals.  16. Albuterol inhaler 2 puff inhalation every four hours while awake. 17. OptiChamber. 18. Montelukast 10 mg at bedtime. 19. Prilosec 20 mg twice a day. 20. Benicar 40 mg daily. 21. Norvasc 5 mg daily. 22. Hydrochlorothiazide 12.5 mg daily.  23. Saline nasal spray one spray to both nostrils daily.  24. Lovenox 40 mg subcutaneous every 12 hours for 14 days then discontinue and begin taking one 81 mg enteric coated aspirin unless contraindicated.   PAST MEDICAL HISTORY:  1. Hypertension.  2. Gastroesophageal reflux disease. 3. Hashimoto disease.  4. Chickenpox.  5. Reactive gastropathy. 6. Blood clots in the legs.  7. Asthma.  8. Sensitive skin. 9. Substernal  goiter. 10. Hyperthyroidism.           11. Morbid obesity.  12. Diabetes.  ____________________________ Vance Peper, PA [id number] D: 09/23/2012 08:00:56 ET T: 09/23/2012 08:25:45 ET JOB#: [id number]  cc: Vance Peper, PA, <Dictator> Shenice Dolder PA ELECTRONICALLY SIGNED 09/25/2012 20:47

## 2015-04-17 NOTE — Op Note (Signed)
PATIENT NAME:  Hailey Hawkins, Hailey Hawkins MR#:  [id number] DATE OF BIRTH:  1946-06-27  DATE OF PROCEDURE:  09/20/2012  PREOPERATIVE DIAGNOSIS: Degenerative arthrosis of the right knee.   POSTOPERATIVE DIAGNOSIS: Degenerative arthrosis of the right knee.   PROCEDURE PERFORMED: Right total knee arthroplasty using computer-assisted navigation.   SURGEON: Laurice Record. Holley Bouche., MD   ASSISTANT: Vance Peper, PA-C (required to maintain retraction throughout the procedure)   ANESTHESIA: Femoral nerve block and spinal.   ESTIMATED BLOOD LOSS: 50 mL.   FLUIDS REPLACED: 2500 mL of Crystalloid.   TOURNIQUET TIME: 83 minutes.   DRAINS: Two medium drains to reinfusion system.   SOFT TISSUE RELEASES: Anterior cruciate ligament, posterior cruciate ligament, deep medial collateral ligament, and patellofemoral ligament.   IMPLANTS UTILIZED: DePuy PFC Sigma size 3 posterior stabilized femoral component (cemented), size 4 MBT revision tibial component (cemented), 35 mm three peg oval dome patella (cemented), and a 10 mm stabilized rotating platform polyethylene insert.   INDICATIONS FOR SURGERY: The patient is a 69 year old female who has been seen for complaints of progressive right knee pain. X-rays demonstrated severe degenerative changes in a tricompartmental fashion. She denies any significant improvement despite conservative nonsurgical intervention. After discussion of the risks and benefits of surgical intervention, the patient expressed her understanding of the risks and benefits and agreed with plans for surgical intervention.   PROCEDURE IN DETAIL: The patient was brought in the operating room and, after adequate femoral nerve block and spinal anesthesia was achieved, a tourniquet was placed on the patient's upper right thigh. The patient's right knee and leg were cleaned and prepped with alcohol and DuraPrep and draped in the usual sterile fashion. A "time-out" was performed as per usual protocol. The right  lower extremity was exsanguinated using an Esmarch and the tourniquet was inflated to 300 mmHg. An anterior longitudinal incision was made followed by a standard mid vastus approach. A moderate effusion was evacuated. Patella was subluxed laterally. The deep fibers of the medial collateral ligament were elevated in a subperiosteal fashion off the medial flare of the tibia so as to maintain a continuous soft tissue sleeve. Patellofemoral ligament was incised. Inspection of the knee demonstrated severe degenerative changes most notably to the medial compartment. Osteophytes were debrided using a rongeur. Anterior and posterior cruciate ligaments were excised. Two 4.0 mm Schanz pins were inserted into the femur and into the tibia for attachment of the array of spheres used for computer-assisted navigation. Hip center was identified using circumduction technique. Distal landmarks were mapped using the computer. Distal femur and proximal tibia were mapped using the computer. Distal femoral cutting guide was positioned using computer-assisted navigation so as to achieve a 5 degree distal valgus cut. Cut was performed and verified using the computer. Distal femur was sized and it was felt that a size 3 femoral component was appropriate. Size 3 cutting guide was positioned and the anterior cut was performed and verified using the computer. This was followed by completion of the posterior and chamfer cuts. Femoral cutting guide for central box was then positioned and central box cut was performed.   Attention was then directed to the proximal tibia. Medial and lateral menisci were excised. The extramedullary tibial cutting guide was positioned using computer-assisted navigation so as to achieve 0 degree varus valgus alignment and 2 degree posterior slope. Given the patient's high body mass index and relative osteopenia, it had been elected to proceed with preparing the tibia for a MBT revision tray. Proximal femoral cut  was performed and verified using the computer. Proximal femur was sized and it was felt that a size 4 tibial tray was appropriate. Tibial and femoral trials were inserted followed by insertion of a 10 mm polyethylene trial. Excellent mediolateral soft tissue balancing was appreciated both in extension and in flexion. Finally, patella was cut and prepared so as to accommodate a 35 mm three peg oval dome patella. Patellar trial was placed and the knee was placed through a range of motion with excellent patellar tracking appreciated.   Femoral trial was removed. Central post hole for the tibial component was reamed followed by insertion of a keel punch. Tibial trials were then removed. Cut surfaces of bone were irrigated with copious amounts of normal saline with antibiotic solution using pulsatile lavage and then suctioned dry. Polymethyl methacrylate cement with gentamicin was prepared in the usual fashion using a vacuum mixer. Cement was applied to the cut surface of the proximal tibia as well as along the undersurface of a size 4 MBT revision tibial tray. Tibial tray was positioned and impacted into place. Excess cement was removed using freer elevators. Next, cement was applied to the cut surface of the femur as well as along the posterior flanges of a size 3 posterior stabilized femoral component. Femoral component was positioned and impacted into place. Excess cement was removed using freer elevators. A 10 mm polyethylene trial was inserted and the knee was brought in full extension with steady axial compression applied. Finally, cement was applied to the backside of a 35 mm three peg oval dome patella and patellar component was positioned and patellar clamp applied. Excess cement was removed using freer elevators.   After adequate curing of cement, the tourniquet was deflated after a total tourniquet time of 83 minutes. Hemostasis was achieved using electrocautery. The knee was irrigated with copious  amounts of normal saline with antibiotic solution using pulsatile lavage and then suctioned dry. The knee was then inspected for any residual cement debris. 30 mL of 0.25% Marcaine with epinephrine was injected along the posterior capsule. A 10 mm stabilized rotating platform polyethylene insert was inserted and the knee was placed through a range of motion. Excellent mediolateral soft tissue balancing was appreciated and excellent patellar tracking appreciated. Two medium drains were placed in the wound bed and brought out through a separate stab incision to be attached to a reinfusion system. The medial parapatellar portion of the incision was reapproximated using interrupted sutures of #1 Vicryl. Subcutaneous tissue was approximated in layers using first #0 Vicryl followed by #2-0 Vicryl. Skin was closed with skin staples. Sterile dressing was applied.   The patient tolerated the procedure well. She was transported to the recovery room in stable condition.   ____________________________ Laurice Record. Holley Bouche., MD [id number] D: 09/20/2012 22:48:33 ET T: 09/21/2012 09:55:52 ET JOB#: [id number]  cc: Laurice Record. Holley Bouche., MD, <Dictator> JAMES P Holley Bouche MD ELECTRONICALLY SIGNED 09/21/2012 20:38

## 2015-04-20 NOTE — H&P (Signed)
PATIENT NAME:  Hailey Hawkins, Hailey Hawkins MR#:  [id number] DATE OF BIRTH:  07/10/1946  DATE OF ADMISSION:  04/20/2013  PRIMARY CARE PHYSICIAN:  Dr. Doy Hutching.   CHIEF COMPLAINT:  Not feeling well.   HISTORY OF PRESENT ILLNESS:  This is a 69 year old female who has noticed some yeast vaginally and under the breast and felt it in her mouth, raw and tender and a swollen gland.  She has had trouble with her distance vision for over a week and some balance issues.  She always has allergies with a cough and thought it was secondary to her allergies.  She went and saw the 14 assistant at the [facility] today and sent off some routine labs and her sugar was found to be over 700 and she was called to go in to the Emergency Room for further evaluation.  She never checks her sugars and did not think they were a problem.  She does take glimepiride for diabetes.  In the ER, she was found to have a sugar of greater than 600 and hospitalist services were contacted for further evaluation.   PAST MEDICAL HISTORY:  Diabetes, hypothyroidism, vocal cord paralysis, hypertension, arthritis, anemia, constipation and anxiety.   PAST SURGICAL HISTORY:  Partial thyroidectomy, bilateral knee replacements, hysterectomy, two C-sections.   ALLERGIES:  BIRTH CONTROL PILLS, AMOXICILLIN, CIPRO, CODEINE.   MEDICATIONS:  As per prescription writer include Advair Diskus 250/50 twice daily, albuterol CFC 2 puffs every 4 to 6 hours, aspirin 81 mg daily, Carafate 1 gram as needed heartburn, Colace 100 mg twice a day, ferrous sulfate 325 mg twice a day, glimepiride 2 mg daily, hydrochlorothiazide 25 mg daily, ibuprofen 200 mg every 4 hours, Singulair 10 mg at bedtime, omeprazole 20 mg daily, saline mist at bedtime nasally, Synthroid 100 mcg daily, tramadol as needed, vitamin B12 1000 mcg injection once a month, Xanax 0.5 mg at bedtime.   SOCIAL HISTORY:  No smoking.  No alcohol.  No drug use.  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   FAMILY HISTORY:  Mother died of some sort of blood disorder.  Father died of congestive heart failure.   REVIEW OF SYSTEMS:  CONSTITUTIONAL:  No fever, no chills.  Positive for hot feeling.  Positive for weight gain, 4 pounds.  EYES:  Does have blurry vision, especially with distance over the last week.  EARS, NOSE, MOUTH, AND THROAT:  Decreased hearing.  Positive for runny nose, positive for sore throat and difficulty with swallowing for the past week.  CARDIOVASCULAR:  No chest pain.  No palpitations.  RESPIRATORY:  Positive for shortness of breath.  Positive for cough, yellow phlegm.  No sputum.  No hemoptysis.  GASTROINTESTINAL:  Positive for constipation.  No bright red blood per rectum.  No melena.  No abdominal pain.  No nausea.  No vomiting.  GENITOURINARY:  Positive for burning on urination.  No hematuria.  MUSCULOSKELETAL:  Joints have been okay, recently taking ibuprofen.  NEUROLOGIC:  No fainting or blackouts, but balance has been a little bit off.  PSYCHIATRIC:  On medication for anxiety at night.  ENDOCRINE:  Positive for hypothyroidism.  HEMATOLOGIC AND LYMPHATIC:  Positive for anemia.   PHYSICAL EXAMINATION: VITAL SIGNS:  Temperature 97.8, pulse 92, respirations 18, blood pressure 116/88, pulse ox 96% on room air.  GENERAL:  No respiratory distress.  EYES:  Conjunctivae and lids normal.  Pupils equal, round, and reactive to light.  Extraocular muscles intact.  No nystagmus.  EARS, NOSE, MOUTH, AND  THROAT:  Tympanic membrane, no erythema.  Nasal mucosa, no erythema.  Throat, no erythema, no exudate seen.  No thrush in the mouth.  Lips and gums, no lesions. NECK:  No JVD.  No bruits.  No lymphadenopathy.  Slight thyromegaly on the left side.  No nodules felt.  LUNGS:  Clear to auscultation.  No use of accessory muscles to breathe.  No rhonchi, rales, or wheeze heard.  CARDIOVASCULAR:  S1, S2 normal.  No gallops, rubs, or murmurs heard.  Carotid upstroke 2+  bilaterally.  No bruits.  EXTREMITIES:  Dorsalis pedis pulses 2+ bilaterally.  No edema of the lower extremity.  ABDOMEN:  Soft, nontender.  No organomegaly/splenomegaly.  Normoactive bowel sounds.  No masses felt.  LYMPHATIC:  No lymph nodes in the neck.  MUSCULOSKELETAL:  No clubbing, edema or cyanosis.  SKIN:  Erythema under the breast.  NEUROLOGIC:  Cranial nerves II through XII grossly intact.  Deep tendon reflexes 2+ bilateral lower extremities.  PSYCHIATRIC:  The patient is oriented to person, place and time.   LABORATORY AND RADIOLOGICAL DATA:  Chest x-ray is negative.  Urinalysis, 1+ leukocyte esterase, greater than 500 mg/dL of glucose, beta hydroxybutyrate 1.95.  Glucose 654, BUN 19, creatinine 1.25, sodium 129, potassium 3.6, chloride 95, CO2 25, calcium 8.7.  Liver function tests normal range.  White blood cell count 9.1, hemoglobin and hematocrit 12.7 and 38.4, platelet count of 287.  Repeat glucose greater than 600.   ASSESSMENT AND PLAN: 1.  Hyperosmolar coma with uncontrolled diabetes type 2 with hyponatremia secondary to high sugars.  We will give IV fluid hydration, insulin drip as per non-diabetic ketoacidosis protocol.  We will admit to the ICU Stepdown with fingersticks q. 1 hour.  I did talk to the patient about her diabetes and checking her sugars on a 4 times a day basis now that she will probably be on insulin.  I will check a hemoglobin A1c.  We will check fingersticks q. 1 while here.  Continue to monitor closely.  2.  Fungal infection, skin under breast and vaginally.  We will give topical nystatin powder underneath the breast and Diflucan orally for vaginal fungal infection.  3.  Hypothyroidism.  Check a TSH.  Continue Synthroid.  4.  Gastroesophageal reflux disease.  Continue omeprazole.  5.  Hypertension.  Continue hydrochlorothiazide.  6.  Allergies, asthma, on Singulair, Advair and albuterol.  7.  We will send off a urine culture.  We will try to hold off on  antibiotics at this time.   TIME SPENT ON ADMISSION:  55 minutes.    ____________________________ Tana Conch. Leslye Peer, MD rjw:[id number] D: 04/20/2013 17:07:55 ET T: 04/20/2013 17:16:37 ET JOB#: [id number]  cc: Tana Conch. Leslye Peer, MD, <Dictator> Dr. Ilean China MD ELECTRONICALLY SIGNED 04/20/2013 20:19

## 2015-04-20 NOTE — Consult Note (Signed)
PATIENT NAME:  Hailey Hawkins, Hailey Hawkins MR#:  [id number] DATE OF BIRTH:  10-31-1946  DATE OF CONSULTATION:  04/21/2013  REFERRING PHYSICIAN: Adin Hector, MD  CONSULTING PHYSICIAN:  A. Lavone Orn, MD  CHIEF COMPLAINT: Uncontrolled diabetes.   HISTORY OF PRESENT ILLNESS: This is a 69 year old female with a history of well-controlled type 2 diabetes, who presented yesterday to internal medicine clinic with complaints of rash and was found to have severe hyperglycemia with a glucose of 716 and hemoglobin A1c of 12.5%. She was sent to the ED and admitted with hyperglycemia without ketosis. She reports she had been feeling bad for at least 2 to 3 months. She did receive a short course of steroids in January, none since. She received antibiotics in January and again about 2 weeks ago for sinusitis. She has had 2 weeks of polyuria and polydipsia. She denies weight loss or weight gain. She denies nausea. She has not been checking her blood sugars. She has been prescribed glimepiride, with which she states she has been compliant. After admission, she was treated with IV fluids and IV antibiotics. Blood sugars normalized, and she was transitioned to subcutaneous multiple daily injections of insulin this morning. She was given Lantus 30 units at 3:30 a.m., and her insulin drip was stopped at 6:30 a.m. For meals, she is getting NovoLog 10 units t.i.d. and then a NovoLog insulin sliding scale q.a.c. and at bedtime.   PAST MEDICAL HISTORY:  1. Type 2 diabetes.  2. Hypothyroidism.  3. Hypertension.  4. Morbid obesity.  5. Goiter.   PAST SURGICAL HISTORY:  1. Colon polyp. 2. Knee surgery.  3. C-section.  4. Tonsillectomy.  5. Hysterectomy.  6. Partial thyroidectomy.  SOCIAL HISTORY: The patient is married. Her husband is at the bedside. No tobacco use.   FAMILY HISTORY: Mother had DVTs and hypertension. Father had prostate cancer. Brother had prostate cancer. Father had diabetes.  ALLERGIES:  1.  ANTIHISTAMINES.  2. AUGMENTIN.  3. CODEINE.   REVIEW OF SYSTEMS:   GENERAL: No weight loss. Appetite good. No fevers.  HEENT: No blurred vision. Mild headaches. She has had a sore throat.  NECK: Denies neck pain or dysphagia.  CARDIAC: Denies chest pain or palpitations.  PULMONARY: Denies cough or shortness of breath.  ABDOMEN: No nausea. Good appetite. No change in bowel habits.  EXTREMITIES: No leg swelling.  SKIN: Denies pruritus. She has had a rash beneath the breasts and on the buttocks, found to be consistent with yeast.  ENDOCRINE: Denies heat or cold intolerance. Again, she has had polyuria and polydipsia.   PHYSICAL EXAMINATION:  VITAL SIGNS: Height 65 inches, weight 267 pounds, BMI 44. Temperature 98.8, pulse 78, respirations 18, blood pressure 134/82.  GENERAL: Obese white female in no acute distress.  HEENT: EOMI. Oropharynx is clear. Mucous membranes moist.  NECK: Supple. Prior linear scar of her lower neck is visualized and well healed. Thyroid not enlarged.  LYMPHATIC: No submandibular or anterior cervical lymphadenopathy.  CARDIAC: Regular rate and rhythm. No audible murmur. No carotid bruit.  PULMONARY: Clear to auscultation bilaterally. No wheeze. Normal inspiratory effort.  ABDOMEN: Diffusely soft, nontender, nondistended. Positive bowel sounds.  EXTREMITIES: No edema is present.  SKIN: No rash on the extremities or acute skin changes.  NEUROLOGIC: EOMI. No gross deficits.  PSYCHIATRIC: Alert and oriented x3.   LABORATORIES: Glucose 138, BUN 14, creatinine 0.63, sodium 140, potassium 3.1, chloride 106, bicarbonate 29, calcium 8.3, magnesium 1.3. Hemoglobin A1c 12.3. TSH 5.27. WBC 7.3, hematocrit  35.1, platelets 251.   ASSESSMENT: A 68 year old female with uncontrolled diabetes, presenting yesterday with severe hyperglycemia, nonketotic state, now much improved after intravenous fluids and intravenous insulin. Cause for severe worsening of glycemic control not clear  although she does admit to dietary indiscretions.  RECOMMENDATIONS:  1. Agree with transition to subcutaneous insulin. Current doses seem reasonable.  2. Diabetes educators have been consulted for teaching , especially with self-administration of insulin.  3. Anticipate at discharge she will continue multiple daily injections of insulin and could restart her glimepiride as well.  4. Will arrange for close followup. I will schedule an appointment for her to see me next week.   Thank you for the kind request for consultation. I will follow along with you.   ____________________________ A. Lavone Orn, MD ams:OSi D: 04/21/2013 13:10:52 ET T: 04/21/2013 13:21:02 ET JOB#: [id number]  cc: A. Lavone Orn, MD, <Dictator> Sherlon Handing MD ELECTRONICALLY SIGNED 04/27/2013 13:11

## 2015-04-20 NOTE — Consult Note (Signed)
Chief Complaint and History:  Primary Physician Fulton Reek, M.D.   Referring Physician Ramonita Lab, M.D.   Chief Complaint Uncontrolled diabetes   Allergies:  PCN: Hives  Cipro: GI Distress  Codeine: Hives, N/V/Diarrhea  Amoxicillin: N/V/Diarrhea  birth control pills caused blood clot: Other  Animal dander-Cat: Other  Assessment/Plan:  Assessment/Plan Patient was seen and examined. Briefly, this is a 69 yo F with long-standing well controlled dibetes who presented to clinic yesterday and was found to have gluc >700, A1c >12%. No ketosis. Feeling poorly x2 months. No abd pain or nausea. +polydipsia and poluria, thrush, and candidiasis in skin folds. Cause of severe worsening of glycemic control not clear. Initially on OV insulin, now on Lantus 30 hs and NovoLog 10 AC + SSI.  A/ Diabetes Obesity Hypothyroidism with TSH ~5  P/ Agree with current insulin orders. Will move Lantus to qhs dosing starting tonight. Will restart glimepiride 2 mg once daily tomorrow. I am hopeful she can be controlled on orals solely in the future. She should monitor her sugars qAC. She has a glucometer. She will get diabetes teaching by [facility] educators. Will follow with you. Scheduled her a clinic f/u next Friday.   Case Discussed With patient, family, Dr. Caryl Comes   Electronic Signatures: Gabriel Carina Betsey Holiday (MD)  (Signed 24-Apr-14 13:19)  Authored: Chief Complaint and History, ALLERGIES, Assessment/Plan   Last Updated: 24-Apr-14 13:19 by Judi Cong (MD)

## 2015-04-21 NOTE — Consult Note (Signed)
Reason for Visit: This 69 year old Female patient presents to the clinic for initial evaluation of  breast cancer .   Referred by Dr. Tamala Julian.  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 On mammogram there was a 2.5 cm cluster of fine pleomorphic and linear branching calcifications in the lower inner quadrant. Patient underwent needle localization positive for high-grade ductal carcinoma in situ with comedonecrosis. Tumor was ER/PR negative.patient went on to have a wide local excision for a 1.4 cm mass of ductal carcinoma in situ again grade 3 comedo necrosis margin clear though at less than 1 mm. Again tumor was ER/PR negative. Patient had some wound dehiscence first night of surgery although it is closed nicely at this point. She's been seen by medical oncology who has not recommended tamoxifen therapy based on the negative ER/PR status. She is seen today for consideration ofadjuvant radiation. She is doing well she specifically denies breast tenderness cough or bone pain.  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:    Diabetes Mellitus, Type II (NIDD):    Asthma:     Hypothyroidism:    GERD - Esophageal Reflux:    HTN:    Right total knee replacement:    Breast biopsy:    Hysterectomy:    Partial thyroidectomy:    Cesarean Section X 2:    left total knee replacement: 21-Apr-2011  Past, Family and Social History:  Past Medical History positive   Cardiovascular hypertension; tachycardia   Gastrointestinal GERD   Endocrine diabetes mellitus; hypothyroidism; insulin-dependent adult-onset diabetes   Past Surgical History hysterectomy, partial thyroidectomy, left total knee replacement, cesarean section x2, left total knee replacement,   Past Medical History Comments pernicious anemia and hyponatremia, morbid obesity   Family History positive   Family History Comments nnegative for breast cancer, family history of adult onset diabetes cardia vascular heart disease CVA and hypertension   Social History noncontributory   Additional Past Medical and Surgical History seen by yourself today accompanied by nurse navigator. Husband was former patient of mine treated for keloid many years ago still alive   Allergies:   Rescue inhaler: Anaphylaxis  Cipro: GI Distress  Codeine: Hives, N/V/Diarrhea  Amoxicillin: N/V/Diarrhea  Penicillin: Hives  birth control pills caused blood clot: Other  Animal dander-Cat: Other  Home Meds:  Home Medications: Medication Instructions Status  Advair Diskus 250 mcg-50 mcg inhalation powder 1 puff(s) inhaled 2 times a day Active  ferrous sulfate 325 mg oral tablet 1 tab(s) orally 2 times a day Active  montelukast 10 mg oral tablet 1 tab(s) orally once a day (in the evening) Active  Colace sodium 100 mg oral capsule 1 cap(s) orally 2 times a day Active  hydrochlorothiazide  25 mg oral tablet 1 tab(s) orally once a day Active  levothyroxine 100 mcg (0.1 mg) oral tablet 1 tab(s) orally once a day Active  Lantus 100 units/mL subcutaneous solution 30 unit(s) subcutaneous once a day (at bedtime) Active   Vitamin B-12 1000 mcg/mL injectable solution 1 milliliter(s) injectable once a month Active  omeprazole 20 mg oral delayed release capsule 1 cap(s) orally 2 times a day Active  Tylenol 500 mg oral tablet 2 tab(s) orally every 6 hours, As Needed Active  Tylenol PM 500 mg-25 mg oral tablet 2 tab(s) orally once a day (at bedtime) Active  sucralfate 1 g oral tablet 1 tab(s) orally 4 times a day (before meals and at bedtime) Active  aspirin 81 mg oral tablet, chewable 1 tab(s) orally once a day (at bedtime) Active  Xanax 0.5 mg oral tablet 1 tab(s) orally once a day Active  albuterol CFC free 90 mcg/inh inhalation aerosol 2 puff(s) inhaled 4 times a day, As Needed Active  metFORMIN 1000 mg oral tablet 1 tab(s) orally 2 times a day Active  Mucinex DM 30 mg-600 mg oral tablet, extended release 1 tab(s) orally every 12 hours, As Needed Active  glimepiride 2 mg oral tablet 1 tab(s) orally once a day Active   Review of Systems:  Performance Status (ECOG) 0   Skin negative   Breast see HPI   Ophthalmologic negative   ENMT negative   Respiratory and Thorax negative   Cardiovascular see HPI   Gastrointestinal negative   Genitourinary negative   Musculoskeletal negative   Neurological negative   Psychiatric negative   Hematology/Lymphatics negative   Endocrine see HPI   Allergic/Immunologic negative   Review of Systems   review of systems obtained for nurse's notes  Nursing Notes:  Nursing Vital Signs and Chemo Nursing Nursing Notes: *CC Vital Signs Flowsheet:   22-Jan-15 09:11  Temp Temperature 98.5  Pulse Pulse 71  Respirations Respirations 18  SBP SBP 141  DBP DBP 86  Pain Scale (0-10)  0  Current Weight (kg) (kg) 116.1  Height (cm) centimeters 167.7  BSA (m2) 2.2   Physical Exam:  General/Skin/HEENT:  General normal   Skin normal   Eyes normal   ENMT normal   Head and Neck normal   Additional PE well-developed obese female in NAD. Lungs are clear to  A&P cardiac examination shows regular rate and rhythm. Abdomen is benign. She status post wide local excision the right breast. There some ecchymosis surrounding the incision site although it is well-healed. No dominant mass or nodularity is noted in either breast into position examined. Abdomen is obese no organomegaly or masses are identified. No axillary or supraclavicular adenopathy is appreciated.   Breasts/Resp/CV/GI/GU:  Respiratory and Thorax normal   Cardiovascular normal   Gastrointestinal normal   Genitourinary normal   MS/Neuro/Psych/Lymph:  Musculoskeletal normal   Neurological normal   Lymphatics normal   Other Results:  Radiology Results: LabUnknown:    08-Dec-14 10:35, Digital Additional Views Rt Breast (SCR)  PACS Image   [facility]:  Digital Additional Views Rt Breast (SCR)   REASON FOR EXAM:    av rt calcifications  COMMENTS:       PROCEDURE: MAM - MAM DIG ADDVIEWS RT SCR  - Dec 05 2013 10:35AM     CLINICAL DATA:  Screening callback for questioned right breast  calcifications    EXAM:  DIGITAL DIAGNOSTIC  right MAMMOGRAM    COMPARISON:  Prior exam    ACR  Breast Density Category b: There are scattered areas of  fibroglandular density.  FINDINGS:  Additional views confirm the presence of a 2.5 cm cluster of fine  pleomorphic and linear branching calcifications in the right lower  inner quadrant, corresponding to the screening mammographic finding.  Compared to the prior 2013 exam, these are new.     IMPRESSION:  Suspicious right breast calcifications, lower inner quadrant.  Stereotactic right core needle biopsy is recommended. We have  contacted the office of Dr. Doy Hutching and recommended stereotactic core  needle biopsy and discussed these findings and recommendations with  the patient at the time of today's exam. The office of Dr. Doy Hutching  wishes to arrange surgical referral. This will be arranged by the  referring physician  office.  RECOMMENDATION:  Right stereotactic core needle biopsy    I have discussed the findings and recommendations with the patient.  Results were also provided in writing at the conclusion of the  visit. If applicable, a reminder letter will be sent to the patient  regarding the next appointment.    BI-RADS CATEGORY  5: Highly suggestive of malignancy - appropriate  action should be taken.      Electronically Signed    By: Conchita Paris M.D.    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 Based on the  diffuse nature for ductal carcinoma in situ would recommend whole breast radiation therapy up to 5000 cGy and boosting or scar another 1600 cGy based on the close margin. Risks and benefits of treatment including skin reaction, fatigue, possible occlusive superficial lung, and possible alteration of blood counts all were explained in detail to the patient. She is going on vacation next week I have set up CT simulation shortly thereafter. Patient will not be a candidate for tamoxifen therapy based on the negative ER/PR status of her tumor.  I would like to take this opportunity to thank you for allowing me to continue to participate in this patient's care.  CC Referral:  cc: Dr. Fulton Reek, Dr. Tamala Julian   Electronic Signatures: Baruch Gouty Roda Shutters (MD)  (Signed 22-Jan-15 09:55)  Authored: HPI, Diagnosis, Past Hx, PFSH, Allergies, Home Meds, ROS, Nursing Notes, Physical Exam, Other  Results, Relevent Results, Encounter Assessment and Plan, CC Referring Physician   Last Updated: 22-Jan-15 09:55 by Armstead Peaks (MD)

## 2015-04-21 NOTE — Op Note (Signed)
PATIENT NAME:  Hailey Hawkins, Hailey Hawkins MR#:  [id number] DATE OF BIRTH:  1946/05/28  DATE OF PROCEDURE:  01/05/2014  PREOPERATIVE DIAGNOSIS: Ductal carcinoma in situ of the right breast.   POSTOPERATIVE DIAGNOSIS: Ductal carcinoma in situ of the right breast.   PROCEDURE: Right partial mastectomy.   SURGEON: Rochel Brome, M.D.   ANESTHESIA: General.   INDICATIONS: This 69 year old female recently had a mammogram depicting a somewhat large cluster of microcalcifications in the medial aspect of the right breast. Stereotactic needle biopsy demonstrated ductal carcinoma in situ. She had preoperative x-ray needle localization with insertion of two Kopans wires in the medial aspect of the right breast. Her post procedural mammograms were reviewed demonstrating the proximity of the bracketing Kopans wires and the microcalcifications.   DESCRIPTION OF PROCEDURE: The patient was placed on the operating table in the supine position under general anesthesia. The dressings were removed from the right breast exposing the two Kopans wires at the medial aspect of the right breast. I noted that the entrance point of the wires was approximately 9 cm from the barb and the site for excision was selected. The wires were cut 2 cm from the skin. The breast was prepared with ChloraPrep and draped in a sterile manner.   A curvilinear incision was made from the 2-o'clock to the 4-o'clock position of the right breast several centimeters deep medial to the border of the areola and carried down through subcutaneous tissues. The 4-o'clock end of the skin ellipse was tagged with a stitch for the pathologist's orientation. The dissection was carried out laterally to dissect down to encounter the Kopans wires, each of which were delivered up into the wound. Next, dissection was carried out around the thick portions of the wires circumferentially and completely excised the wires. The specimen was marked with the margin maps, which markers  were sutured to the skin to mark the medial, lateral, cranial, caudal and deep margins and this was submitted for specimen mammogram and pathology.   The wound was inspected. Several small bleeding points were cauterized. It is noted that during the course of the procedure, there were 2 veins which were suture ligated with 4-0 chromic. The sites of cautery artifact were infiltrated with 0.5% Sensorcaine with epinephrine. Also subcutaneous tissues were infiltrated as well using a total of 15 mL. Next, the subcutaneous tissues were approximated with 4-0 chromic. Next, a number of 5-0 Monocryl sutures were placed somewhat more superficially and then the skin was closed with running 5-0 Monocryl subcuticular suture. The radiologist called back to report that all the calcifications and the biopsy marker were seen within the specimen and appeared satisfactory. Later, the pathologist called back to report that the biopsy site was identified and it appeared that margins were clear. Subsequently, the wound was treated with Dermabond. The patient appeared to be in satisfactory condition and was prepared for transfer to the recovery room.   ____________________________ Lenna Sciara. Rochel Brome, MD jws:aw D: 01/05/2014 12:47:19 ET T: 01/05/2014 13:00:01 ET JOB#: [id number]  cc: Loreli Dollar, MD, <Dictator> Loreli Dollar MD ELECTRONICALLY SIGNED 01/05/2014 19:55

## 2015-05-16 ENCOUNTER — Ambulatory Visit: Payer: Medicare Other | Admitting: Radiation Oncology

## 2015-06-19 ENCOUNTER — Encounter: Payer: Self-pay | Admitting: Hematology and Oncology

## 2015-06-19 ENCOUNTER — Encounter: Payer: Self-pay | Admitting: Radiation Oncology

## 2015-06-19 ENCOUNTER — Ambulatory Visit
Admission: RE | Admit: 2015-06-19 | Discharge: 2015-06-19 | Disposition: A | Discharge status: Home / Self Care | Payer: Medicare Other | Source: Ambulatory Visit | Attending: Radiation Oncology | Admitting: Radiation Oncology

## 2015-06-19 ENCOUNTER — Inpatient Hospital Stay: Payer: Medicare Other | Attending: Hematology and Oncology

## 2015-06-19 ENCOUNTER — Inpatient Hospital Stay (HOSPITAL_BASED_OUTPATIENT_CLINIC_OR_DEPARTMENT_OTHER): Payer: Medicare Other | Admitting: Hematology and Oncology

## 2015-06-19 VITALS — BP 159/92 | HR 78 | Temp 97.7°F | Resp 18 | Wt 260.1 lb

## 2015-06-19 VITALS — BP 133/98 | HR 71 | Temp 98.2°F | Resp 22 | Ht 65.5 in | Wt 260.6 lb

## 2015-06-19 DIAGNOSIS — Z171 Estrogen receptor negative status [ER-]: Secondary | ICD-10-CM | POA: Diagnosis not present

## 2015-06-19 DIAGNOSIS — R5383 Other fatigue: Secondary | ICD-10-CM | POA: Diagnosis not present

## 2015-06-19 DIAGNOSIS — Z86000 Personal history of in-situ neoplasm of breast: Secondary | ICD-10-CM

## 2015-06-19 DIAGNOSIS — D509 Iron deficiency anemia, unspecified: Secondary | ICD-10-CM | POA: Diagnosis not present

## 2015-06-19 DIAGNOSIS — C50911 Malignant neoplasm of unspecified site of right female breast: Secondary | ICD-10-CM

## 2015-06-19 DIAGNOSIS — Z794 Long term (current) use of insulin: Secondary | ICD-10-CM | POA: Diagnosis not present

## 2015-06-19 DIAGNOSIS — K219 Gastro-esophageal reflux disease without esophagitis: Secondary | ICD-10-CM

## 2015-06-19 DIAGNOSIS — E119 Type 2 diabetes mellitus without complications: Secondary | ICD-10-CM | POA: Diagnosis not present

## 2015-06-19 DIAGNOSIS — I10 Essential (primary) hypertension: Secondary | ICD-10-CM | POA: Diagnosis not present

## 2015-06-19 DIAGNOSIS — D0511 Intraductal carcinoma in situ of right breast: Secondary | ICD-10-CM

## 2015-06-19 DIAGNOSIS — Z923 Personal history of irradiation: Secondary | ICD-10-CM

## 2015-06-19 DIAGNOSIS — E039 Hypothyroidism, unspecified: Secondary | ICD-10-CM | POA: Diagnosis not present

## 2015-06-19 DIAGNOSIS — C50919 Malignant neoplasm of unspecified site of unspecified female breast: Secondary | ICD-10-CM

## 2015-06-19 DIAGNOSIS — E871 Hypo-osmolality and hyponatremia: Secondary | ICD-10-CM

## 2015-06-19 DIAGNOSIS — Z79899 Other long term (current) drug therapy: Secondary | ICD-10-CM | POA: Diagnosis not present

## 2015-06-19 LAB — COMPREHENSIVE METABOLIC PANEL
ALT: 11 U/L — ABNORMAL LOW (ref 14–54)
AST: 17 U/L (ref 15–41)
Albumin: 3.7 g/dL (ref 3.5–5.0)
Alkaline Phosphatase: 58 U/L (ref 38–126)
Anion gap: 8 (ref 5–15)
BUN: 13 mg/dL (ref 6–20)
CO2: 23 mmol/L (ref 22–32)
Calcium: 8.2 mg/dL — ABNORMAL LOW (ref 8.9–10.3)
Chloride: 95 mmol/L — ABNORMAL LOW (ref 101–111)
Creatinine, Ser: 0.58 mg/dL (ref 0.44–1.00)
GFR calc Af Amer: >60 mL/min (ref >60)
GFR calc non Af Amer: >60 mL/min (ref >60)
Glucose, Bld: 128 mg/dL — ABNORMAL HIGH (ref 65–99)
Potassium: 4 mmol/L (ref 3.5–5.1)
Sodium: 126 mmol/L — ABNORMAL LOW (ref 135–145)
Total Bilirubin: 0.4 mg/dL (ref 0.3–1.2)
Total Protein: 7.2 g/dL (ref 6.5–8.1)

## 2015-06-19 LAB — CBC WITH DIFFERENTIAL/PLATELET
Basophils Absolute: 0.1 10*3/uL (ref 0–0.1)
Basophils Relative: 1 %
Eosinophils Absolute: 0.2 10*3/uL (ref 0–0.7)
Eosinophils Relative: 2 %
HCT: 38.4 % (ref 35.0–47.0)
Hemoglobin: 12.6 g/dL (ref 12.0–16.0)
Lymphocytes Relative: 17 %
Lymphs Abs: 1.8 10*3/uL (ref 1.0–3.6)
MCH: 27.5 pg (ref 26.0–34.0)
MCHC: 32.9 g/dL (ref 32.0–36.0)
MCV: 83.5 fL (ref 80.0–100.0)
Monocytes Absolute: 0.8 10*3/uL (ref 0.2–0.9)
Monocytes Relative: 7 %
Neutro Abs: 8.1 10*3/uL — ABNORMAL HIGH (ref 1.4–6.5)
Neutrophils Relative %: 73 %
Platelets: 347 10*3/uL (ref 150–440)
RBC: 4.59 MIL/uL (ref 3.80–5.20)
RDW: 12.7 % (ref 11.5–14.5)
WBC: 11 10*3/uL (ref 3.6–11.0)

## 2015-06-19 NOTE — Progress Notes (Signed)
Radiation Oncology Follow up Note  Name: Hailey Hawkins   Date:   06/19/2015 MRN:  [id number] DOB: 07-20-1946    This 69 y.o. female presents to the clinic today for follow-up for breast cancer stage 0 (Tis N0 M0) ER/PR negative high-grade ductal carcinoma in situ status post wide local excision of the right breast and adjuvant whole breast radiation.  REFERRING PROVIDER: No ref. provider found  HPI: Patient is a 68 year old female now 1 year out having completed whole breast radiation to her right breast for ductal carcinoma in situ ER/PR negative. Seen today in routine follow-up she still has shooting pains in the right breast occasionally which I have again explained to her is secondary to healing of the nerves. She otherwise specifically denies breast tenderness cough or bone pain. Follow-up mammograms back in December for fine. She is not on tamoxifen based on the ER PR negative nature of her disease..  COMPLICATIONS OF TREATMENT: none  FOLLOW UP COMPLIANCE: keeps appointments   PHYSICAL EXAM:  BP 159/92 mmHg  Pulse 78  Temp(Src) 97.7 F (36.5 C)  Resp 18  Wt 260 lb 2.3 oz (118 kg) Lungs are clear to A&P cardiac examination essentially unremarkable with regular rate and rhythm. No dominant mass or nodularity is noted in either breast in 2 positions examined. Incision is well-healed. No axillary or supraclavicular adenopathy is appreciated. Cosmetic result is excellent. Breasts are extremely large and pendulous. Well-developed well-nourished patient in NAD. HEENT reveals PERLA, EOMI, discs not visualized.  Oral cavity is clear. No oral mucosal lesions are identified. Neck is clear without evidence of cervical or supraclavicular adenopathy. Lungs are clear to A&P. Cardiac examination is essentially unremarkable with regular rate and rhythm without murmur rub or thrill. Abdomen is benign with no organomegaly or masses noted. Motor sensory and DTR levels are equal and symmetric in the  upper and lower extremities. Cranial nerves II through XII are grossly intact. Proprioception is intact. No peripheral adenopathy or edema is identified. No motor or sensory levels are noted. Crude visual fields are within normal range.   RADIOLOGY RESULTS: Mammograms are reviewed  PLAN: At the present time she continues to do well with no evidence of disease. I am please were overall progress. I've asked to see her back in 1 year for follow-up. She knows to call sooner with any concerns.  I would like to take this opportunity for allowing me to participate in the care of your patient.Armstead Peaks., MD

## 2015-06-19 NOTE — Progress Notes (Signed)
[facility] day:  06/19/2015  Chief Complaint: Hailey Hawkins is a 69 y.o. female with right breast ductal carcinoma in situ who is seen for reassessment.  HPI:  The patient presented in 11/2013 with an abnormal mammogram. Imaging studies revealed a 2.5 cm cluster of flat fine pleomorphic and linear branching calcifications in the lower inner quadrant of the right breast. 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  Tumor was ER was negative (< 1%) and PR was negative (0%).  She underwent wide local excision on 01/05/2014 by Dr. Rochel Brome.  Pathology revealed a 1.4 cm focus of grade 3 comedo type DCIS with microcalcifications and desmoplastic stromal response.  There was no evidence of invasive carcinoma. Margins were clear.    She completed radiation to her right breast on 04/04/2014.   She notes iron deficiency ("low iron").  She takes oral iron.  She has an EGD and colonoscopy (unknown date).  She undergoes endoscopy every 5 years.  She is "getting close to needing GI follow-up".  Symptomatically, she notes "strong shooting pains out through the nipple" since surgery.  She is fatigued "all of the time".     Past Medical History  Diagnosis Date  . Tachycardia   . GERD (gastroesophageal reflux disease)   . Back pain   . Anemia     pernicious  . Diabetes mellitus without complication ([place])   . Asthma   . Hypothyroidism   . Hypertension   . Breast cancer [facility]) 2014    right    Past Surgical History  Procedure Laterality Date  . Thyroidectomy, partial      caused throat paralysis  right side  . Replacement total knee Right   . Abdominal hysterectomy    . Replacement total knee Left   . Cesarean section      x2  . Mastectomy, partial Right   . Breast biopsy Right 2014    Family History  Problem Relation Age of Onset  . Breast cancer  Neg Hx     Social History:  reports that she has never smoked. She does not have any smokeless tobacco history on file. She reports that she does not drink alcohol or use illicit drugs.  The patient is alone today.  Allergies:  Allergies  Allergen Reactions  . Methamphetamine Shortness Of Breath    Difficulty breathing (INHALER)  . Amoxicillin     Other reaction(s): Diarrhea and vomiting (finding)  . Antihistamines, Chlorpheniramine-Type     Other reaction(s): Other (See Comments) Severe dryness  . Ciprofloxacin     Other reaction(s): Distress (finding)  . Codeine     Other reaction(s): Diarrhea and vomiting (finding), Weal or any derivitive  . Other Other (See Comments)    Birth control pills - blood clots  . Penicillin G     Other reaction(s): Weal  . Meloxicam Palpitations    Current Medications: Current Outpatient Prescriptions  Medication Sig Dispense Refill  . acetaminophen (TYLENOL) 500 MG tablet Take 1,000 mg by mouth every 6 (six) hours as needed for mild pain.    Marland Kitchen albuterol (PROVENTIL HFA;VENTOLIN HFA) 108 (90 BASE) MCG/ACT inhaler Inhale 2 puffs into the lungs every 4 (four) hours as needed for wheezing or shortness of breath.    . ALPRAZolam (XANAX) 0.5 MG tablet Take 0.5 mg by mouth daily.    . cyanocobalamin 1000 MCG tablet Inject 1,000  mcg into the muscle every 30 (thirty) days.    Marland Kitchen dextromethorphan-guaiFENesin (MUCINEX DM) 30-600 MG per 12 hr tablet Take 1 tablet by mouth 2 (two) times daily as needed for cough.    . diphenhydramine-acetaminophen (TYLENOL PM) 25-500 MG TABS Take 2 tablets by mouth at bedtime as needed.    . docusate sodium (COLACE) 100 MG capsule Take 100 mg by mouth 2 (two) times daily.    . ferrous sulfate 325 (65 FE) MG tablet Take 325 mg by mouth 2 (two) times daily with a meal.    . Fluticasone-Salmeterol (ADVAIR) 250-50 MCG/DOSE AEPB Inhale 1 puff into the lungs 2 (two) times daily.    . hydrALAZINE (APRESOLINE) 50 MG tablet Take by  mouth 3 (three) times daily.     . insulin glargine (LANTUS) 100 UNIT/ML injection Inject 30 Units into the skin at bedtime.    Marland Kitchen levothyroxine (SYNTHROID, LEVOTHROID) 100 MCG tablet Take 100 mcg by mouth daily before breakfast.    . losartan (COZAAR) 50 MG tablet Take 50 mg by mouth 2 (two) times daily.    . metFORMIN (GLUCOPHAGE) 1000 MG tablet Take 1,000 mg by mouth 2 (two) times daily with a meal.    . metoprolol succinate (TOPROL-XL) 50 MG 24 hr tablet Take 50 mg by mouth daily. Take with or immediately following a meal.    . montelukast (SINGULAIR) 10 MG tablet Take 10 mg by mouth at bedtime.    Marland Kitchen omeprazole (PRILOSEC OTC) 20 MG tablet Take 20 mg by mouth 2 (two) times daily.    . OXYGEN Inhale 2 L into the lungs at bedtime.    . sucralfate (CARAFATE) 1 G tablet Take 1 g by mouth 4 (four) times daily.      No current facility-administered medications for this visit.    Review of Systems:  GENERAL:  Feels good.  Active.  No fevers, sweats or weight loss. PERFORMANCE STATUS (ECOG):  1 HEENT:  No visual changes, runny nose, sore throat, mouth sores or tenderness. Lungs: No shortness of breath or cough.  No hemoptysis. Cardiac:  No chest pain, palpitations, orthopnea, or PND. Breasts:  Shooting pain through right nipple.  Tender breasts.  GI:  No nausea, vomiting, diarrhea, constipation, melena or hematochezia. GU:  No urgency, frequency, dysuria, or hematuria. Musculoskeletal:  No back pain.  No joint pain.  No muscle tenderness. Extremities:  No pain or swelling. Skin:  No rashes or skin changes. Neuro:  No headache, numbness or weakness, balance or coordination issues. Endocrine:  No diabetes, thyroid issues, hot flashes or night sweats. Psych:  No mood changes, depression or anxiety. Pain:  No focal pain. Review of systems:  All other systems reviewed and found to be negative.   Physical Exam: Blood pressure 133/98, pulse 71, temperature 98.2 F (36.8 C), temperature source  Oral, resp. rate 22, height 5' 5.5" (1.664 m), weight 260 lb 9.3 oz (118.199 kg). GENERAL:  Well developed, well nourished, heavyset woman sitting comfortably in the exam room in no acute distress. MENTAL STATUS:  Alert and oriented to person, place and time. HEAD:  Short styled graying hair.  Normocephalic, atraumatic, face symmetric, no Cushingoid features. EYES:  Blue eyes.  Pupils equal round and reactive to light and accomodation.  No conjunctivitis or scleral icterus. ENT:  Oropharynx clear without lesion.  Tongue normal. Mucous membranes moist.  RESPIRATORY:  Clear to auscultation without rales, wheezes or rhonchi. CARDIOVASCULAR:  Regular rate and rhythm without murmur, rub or gallop.  BREAST:  Right breast with tender inferior quadrant post-operative and post-radiation changes.  No discrete masses, skin changes or nipple discharge.  Left breast with fibrocystic changes (tender).  No masses, skin changes or nipple discharge. ABDOMEN:  Soft, non-tender, with active bowel sounds, and no hepatosplenomegaly.  No masses. BACK:  No tenderness on percussion of the back. SKIN:  No rashes, ulcers or lesions. EXTREMITIES: No edema, no skin discoloration or tenderness.  No palpable cords. LYMPH NODES: No palpable cervical, supraclavicular, axillary or inguinal adenopathy  NEUROLOGICAL: Unremarkable. PSYCH:  Appropriate.   Appointment on 06/19/2015  Component Date Value Ref Range Status  . CA 27.29 06/19/2015 16.4  0.0 - 38.6 U/mL Final   Comment: (NOTE) [facility]/ACS methodology Performed At: [facility] [place], [place] [id number] Lindon Romp MD [id number]   . WBC 06/19/2015 11.0  3.6 - 11.0 K/uL Final  . RBC 06/19/2015 4.59  3.80 - 5.20 MIL/uL Final  . Hemoglobin 06/19/2015 12.6  12.0 - 16.0 g/dL Final  . HCT 06/19/2015 38.4  35.0 - 47.0 % Final  . MCV 06/19/2015 83.5  80.0 - 100.0 fL Final  . MCH 06/19/2015 27.5  26.0 - 34.0 pg Final  . MCHC  06/19/2015 32.9  32.0 - 36.0 g/dL Final  . RDW 06/19/2015 12.7  11.5 - 14.5 % Final  . Platelets 06/19/2015 347  150 - 440 K/uL Final  . Neutrophils Relative % 06/19/2015 73   Final  . Neutro Abs 06/19/2015 8.1* 1.4 - 6.5 K/uL Final  . Lymphocytes Relative 06/19/2015 17   Final  . Lymphs Abs 06/19/2015 1.8  1.0 - 3.6 K/uL Final  . Monocytes Relative 06/19/2015 7   Final  . Monocytes Absolute 06/19/2015 0.8  0.2 - 0.9 K/uL Final  . Eosinophils Relative 06/19/2015 2   Final  . Eosinophils Absolute 06/19/2015 0.2  0 - 0.7 K/uL Final  . Basophils Relative 06/19/2015 1   Final  . Basophils Absolute 06/19/2015 0.1  0 - 0.1 K/uL Final  . Sodium 06/19/2015 126* 135 - 145 mmol/L Final  . Potassium 06/19/2015 4.0  3.5 - 5.1 mmol/L Final  . Chloride 06/19/2015 95* 101 - 111 mmol/L Final  . CO2 06/19/2015 23  22 - 32 mmol/L Final  . Glucose, Bld 06/19/2015 128* 65 - 99 mg/dL Final  . BUN 06/19/2015 13  6 - 20 mg/dL Final  . Creatinine, Ser 06/19/2015 0.58  0.44 - 1.00 mg/dL Final  . Calcium 06/19/2015 8.2* 8.9 - 10.3 mg/dL Final  . Total Protein 06/19/2015 7.2  6.5 - 8.1 g/dL Final  . Albumin 06/19/2015 3.7  3.5 - 5.0 g/dL Final  . AST 06/19/2015 17  15 - 41 U/L Final  . ALT 06/19/2015 11* 14 - 54 U/L Final  . Alkaline Phosphatase 06/19/2015 58  38 - 126 U/L Final  . Total Bilirubin 06/19/2015 0.4  0.3 - 1.2 mg/dL Final  . GFR calc non Af Amer 06/19/2015 >60  >60 mL/min Final  . GFR calc Af Amer 06/19/2015 >60  >60 mL/min Final   Comment: (NOTE) The eGFR has been calculated using the CKD EPI equation. This calculation has not been validated in all clinical situations. eGFR's persistently <60 mL/min signify possible Chronic Kidney Disease.   . Anion gap 06/19/2015 8  5 - 15 Final    Assessment:  Deijah Melvenia Favela is a 69 y.o. 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  Pathology revealed a 1.4 cm focus  of grade 3 comedo type DCIS with microcalcifications and desmoplastic  stromal response.  There was no evidence of invasive carcinoma. Margins were clear.  Tumor was ER was negative (< 1%) and PR was negative (0%).  She completed radiation to her right breast on 04/04/2014.   She has a history of iron deficiency.  She is on oral iron.  She has an EGD and colonoscopy every 5 years.  She has chronic hyponatremia.  Symptomatically, she notes chronic fatigue and "shooting pains out through the nipple" since surgery.  Exam reveals tender fibrocystic changes.  Sodium is 126.  Plan: 1. Review entire medical history, diagnosis and management of breast cancer (stage 0). 2. Labs today:  CBC with diff, CMP, CA27.29. 3. Schedule bilateral mammogram on 12/14/2015. 4. FAX labs to primary care physician regarding hyponatremia (Na 126).  Chronic per review of labs. 5. RTC in 6 months for MD assess, labs (CBC with diff, CMP), and review of mammogram.   Lequita Asal, MD  06/19/2015, 1:59 PM

## 2015-06-20 ENCOUNTER — Other Ambulatory Visit: Payer: Self-pay | Admitting: Hematology and Oncology

## 2015-06-20 ENCOUNTER — Ambulatory Visit: Payer: Medicare Other | Admitting: Radiation Oncology

## 2015-06-20 LAB — CANCER ANTIGEN 27.29: CA 27.29: 16.4 U/mL (ref 0.0–38.6)

## 2015-06-21 ENCOUNTER — Ambulatory Visit: Payer: Medicare Other | Admitting: Radiation Oncology

## 2015-07-05 DIAGNOSIS — E871 Hypo-osmolality and hyponatremia: Secondary | ICD-10-CM | POA: Insufficient documentation

## 2015-10-17 DIAGNOSIS — E222 Syndrome of inappropriate secretion of antidiuretic hormone: Secondary | ICD-10-CM | POA: Insufficient documentation

## 2015-12-17 ENCOUNTER — Ambulatory Visit
Admission: RE | Admit: 2015-12-17 | Discharge: 2015-12-17 | Disposition: A | Discharge status: Home / Self Care | Payer: Medicare Other | Source: Ambulatory Visit | Attending: Hematology and Oncology | Admitting: Hematology and Oncology

## 2015-12-17 ENCOUNTER — Other Ambulatory Visit: Payer: Self-pay | Admitting: Hematology and Oncology

## 2015-12-17 DIAGNOSIS — Z853 Personal history of malignant neoplasm of breast: Secondary | ICD-10-CM | POA: Diagnosis not present

## 2015-12-17 DIAGNOSIS — R921 Mammographic calcification found on diagnostic imaging of breast: Secondary | ICD-10-CM | POA: Diagnosis not present

## 2015-12-17 DIAGNOSIS — C50919 Malignant neoplasm of unspecified site of unspecified female breast: Secondary | ICD-10-CM

## 2015-12-18 ENCOUNTER — Other Ambulatory Visit: Payer: Self-pay | Admitting: Hematology and Oncology

## 2015-12-18 DIAGNOSIS — D0511 Intraductal carcinoma in situ of right breast: Secondary | ICD-10-CM

## 2016-01-04 ENCOUNTER — Inpatient Hospital Stay (HOSPITAL_BASED_OUTPATIENT_CLINIC_OR_DEPARTMENT_OTHER): Payer: Medicare Other | Admitting: Hematology and Oncology

## 2016-01-04 ENCOUNTER — Inpatient Hospital Stay: Payer: Medicare Other | Attending: Hematology and Oncology

## 2016-01-04 VITALS — BP 134/95 | HR 72 | Temp 98.7°F | Resp 18 | Ht 65.5 in | Wt 257.5 lb

## 2016-01-04 DIAGNOSIS — D0511 Intraductal carcinoma in situ of right breast: Secondary | ICD-10-CM

## 2016-01-04 DIAGNOSIS — D509 Iron deficiency anemia, unspecified: Secondary | ICD-10-CM | POA: Insufficient documentation

## 2016-01-04 DIAGNOSIS — Z86000 Personal history of in-situ neoplasm of breast: Secondary | ICD-10-CM | POA: Diagnosis present

## 2016-01-04 DIAGNOSIS — E871 Hypo-osmolality and hyponatremia: Secondary | ICD-10-CM

## 2016-01-04 DIAGNOSIS — J45909 Unspecified asthma, uncomplicated: Secondary | ICD-10-CM | POA: Diagnosis not present

## 2016-01-04 DIAGNOSIS — Z79899 Other long term (current) drug therapy: Secondary | ICD-10-CM | POA: Diagnosis not present

## 2016-01-04 DIAGNOSIS — K219 Gastro-esophageal reflux disease without esophagitis: Secondary | ICD-10-CM | POA: Diagnosis not present

## 2016-01-04 DIAGNOSIS — Z794 Long term (current) use of insulin: Secondary | ICD-10-CM | POA: Insufficient documentation

## 2016-01-04 DIAGNOSIS — I10 Essential (primary) hypertension: Secondary | ICD-10-CM

## 2016-01-04 DIAGNOSIS — E039 Hypothyroidism, unspecified: Secondary | ICD-10-CM | POA: Insufficient documentation

## 2016-01-04 DIAGNOSIS — C50919 Malignant neoplasm of unspecified site of unspecified female breast: Secondary | ICD-10-CM

## 2016-01-04 DIAGNOSIS — Z7984 Long term (current) use of oral hypoglycemic drugs: Secondary | ICD-10-CM | POA: Insufficient documentation

## 2016-01-04 DIAGNOSIS — B372 Candidiasis of skin and nail: Secondary | ICD-10-CM

## 2016-01-04 DIAGNOSIS — R5382 Chronic fatigue, unspecified: Secondary | ICD-10-CM | POA: Insufficient documentation

## 2016-01-04 DIAGNOSIS — E119 Type 2 diabetes mellitus without complications: Secondary | ICD-10-CM

## 2016-01-04 DIAGNOSIS — Z86010 Personal history of colonic polyps: Secondary | ICD-10-CM | POA: Insufficient documentation

## 2016-01-04 DIAGNOSIS — N6019 Diffuse cystic mastopathy of unspecified breast: Secondary | ICD-10-CM | POA: Insufficient documentation

## 2016-01-04 LAB — COMPREHENSIVE METABOLIC PANEL
ALT: 18 U/L (ref 14–54)
AST: 19 U/L (ref 15–41)
Albumin: 3.6 g/dL (ref 3.5–5.0)
Alkaline Phosphatase: 70 U/L (ref 38–126)
Anion gap: 8 (ref 5–15)
BUN: 15 mg/dL (ref 6–20)
CO2: 24 mmol/L (ref 22–32)
Calcium: 8.9 mg/dL (ref 8.9–10.3)
Chloride: 96 mmol/L — ABNORMAL LOW (ref 101–111)
Creatinine, Ser: 0.74 mg/dL (ref 0.44–1.00)
GFR calc Af Amer: >60 mL/min (ref >60)
GFR calc non Af Amer: >60 mL/min (ref >60)
Glucose, Bld: 253 mg/dL — ABNORMAL HIGH (ref 65–99)
Potassium: 4.1 mmol/L (ref 3.5–5.1)
Sodium: 128 mmol/L — ABNORMAL LOW (ref 135–145)
Total Bilirubin: 0.5 mg/dL (ref 0.3–1.2)
Total Protein: 7.9 g/dL (ref 6.5–8.1)

## 2016-01-04 LAB — CBC WITH DIFFERENTIAL/PLATELET
Basophils Absolute: 0.1 10*3/uL (ref 0–0.1)
Basophils Relative: 1 %
Eosinophils Absolute: 0.2 10*3/uL (ref 0–0.7)
Eosinophils Relative: 2 %
HCT: 36.4 % (ref 35.0–47.0)
Hemoglobin: 12.1 g/dL (ref 12.0–16.0)
Lymphocytes Relative: 22 %
Lymphs Abs: 1.9 10*3/uL (ref 1.0–3.6)
MCH: 27.5 pg (ref 26.0–34.0)
MCHC: 33.2 g/dL (ref 32.0–36.0)
MCV: 82.9 fL (ref 80.0–100.0)
Monocytes Absolute: 0.5 10*3/uL (ref 0.2–0.9)
Monocytes Relative: 6 %
Neutro Abs: 5.9 10*3/uL (ref 1.4–6.5)
Neutrophils Relative %: 69 %
Platelets: 342 10*3/uL (ref 150–440)
RBC: 4.39 MIL/uL (ref 3.80–5.20)
RDW: 12.8 % (ref 11.5–14.5)
WBC: 8.5 10*3/uL (ref 3.6–11.0)

## 2016-01-04 MED ORDER — NYSTATIN 100000 UNIT/GM EX POWD: CUTANEOUS | Status: DC

## 2016-01-05 ENCOUNTER — Encounter: Payer: Self-pay | Admitting: Hematology and Oncology

## 2016-01-05 LAB — CANCER ANTIGEN 27.29: CA 27.29: 25.8 U/mL (ref 0.0–38.6)

## 2016-01-05 NOTE — Progress Notes (Signed)
[facility] day:  01/04/2016  Chief Complaint: Hailey Hawkins is a 70 y.o. female with right breast ductal carcinoma in situ who is seen for 6 month assessment.  HPI:  The patient was last seen in the medical oncology clinic on 06/19/2015.  At that time, she was seen for initial assessment.  Symptomatically, she had chronic fatigue.  She noted "strong shooting pains out through the nipple" since surgery.  Exam revealed tender fibrocystic changes.  Labs were norable for a sodium of 126 (chronic).  CA27.29 was 16.4 (normal).  During the interim, she has done well until her recent mammogram.  Mammogram on 12/17/2015 revealed a new group of 6 mm calcifications of the right breastwhich appeared around a central lucency suggestive of fat necrosis.  Follow-up mammogram was recommended in 6 months.  She has worried since as her original diagnosis was made after calcifications were noted.  She states that she does not do a breast exam as it hurts and there are "so many lumps in there".  She states a colonoscopy is planned for 03/2016.  She has issues with indigestion and colon polyps.  Since her last, visit her 53 year old daughter was diagnosed with stage I colon cancer.  She presented with rectal bleeding.   Past Medical History  Diagnosis Date  . Tachycardia   . GERD (gastroesophageal reflux disease)   . Back pain   . Anemia     pernicious  . Diabetes mellitus without complication ([place])   . Asthma   . Hypothyroidism   . Hypertension   . Breast cancer [facility]) 2014    right    Past Surgical History  Procedure Laterality Date  . Thyroidectomy, partial      caused throat paralysis  right side  . Replacement total knee Right   . Abdominal hysterectomy    . Replacement total knee Left   . Cesarean section      x2  . Mastectomy, partial Right   . Breast biopsy Right 2014    Family History  Problem Relation Age of Onset  . Breast cancer Neg Hx    . Cancer Daughter     colon cancer; age 36    Social History:  reports that she has never smoked. She does not have any smokeless tobacco history on file. She reports that she does not drink alcohol or use illicit drugs.  The patient is alone today.  Allergies:  Allergies  Allergen Reactions  . Methamphetamine Shortness Of Breath    Difficulty breathing (INHALER)  . Amoxicillin     Other reaction(s): Diarrhea and vomiting (finding)  . Antihistamines, Chlorpheniramine-Type     Other reaction(s): Other (See Comments) Severe dryness  . Ciprofloxacin     Other reaction(s): Distress (finding)  . Codeine     Other reaction(s): Diarrhea and vomiting (finding), Weal or any derivitive  . Other Other (See Comments)    Birth control pills - blood clots  . Penicillin G     Other reaction(s): Weal  . Meloxicam Palpitations    Current Medications: Current Outpatient Prescriptions  Medication Sig Dispense Refill  . acetaminophen (TYLENOL) 500 MG tablet Take 1,000 mg by mouth every 6 (six) hours as needed for mild pain.    Marland Kitchen albuterol (PROVENTIL HFA;VENTOLIN HFA) 108 (90 BASE) MCG/ACT inhaler Inhale 2 puffs into the lungs every 4 (four) hours as needed for wheezing or shortness of breath.    . ALPRAZolam (XANAX) 0.5 MG  tablet Take 0.5 mg by mouth daily.    . cyanocobalamin 1000 MCG tablet Inject 1,000 mcg into the muscle every 30 (thirty) days.    Marland Kitchen dextromethorphan-guaiFENesin (MUCINEX DM) 30-600 MG per 12 hr tablet Take 1 tablet by mouth 2 (two) times daily as needed for cough.    . diphenhydramine-acetaminophen (TYLENOL PM) 25-500 MG TABS Take 2 tablets by mouth at bedtime as needed.    . docusate sodium (COLACE) 100 MG capsule Take 100 mg by mouth 2 (two) times daily.    . ferrous sulfate 325 (65 FE) MG tablet Take 325 mg by mouth 2 (two) times daily with a meal.    . Fluticasone-Salmeterol (ADVAIR) 250-50 MCG/DOSE AEPB Inhale 1 puff into the lungs 2 (two) times daily.    . hydrALAZINE  (APRESOLINE) 50 MG tablet Take by mouth 3 (three) times daily.     . insulin glargine (LANTUS) 100 UNIT/ML injection Inject 30 Units into the skin at bedtime.    Marland Kitchen levothyroxine (SYNTHROID, LEVOTHROID) 100 MCG tablet Take 100 mcg by mouth daily before breakfast.    . losartan (COZAAR) 50 MG tablet Take 50 mg by mouth 2 (two) times daily.    . metFORMIN (GLUCOPHAGE) 1000 MG tablet Take 1,000 mg by mouth 2 (two) times daily with a meal.    . metoprolol succinate (TOPROL-XL) 50 MG 24 hr tablet Take 50 mg by mouth daily. Take with or immediately following a meal.    . montelukast (SINGULAIR) 10 MG tablet Take 10 mg by mouth at bedtime.    Marland Kitchen omeprazole (PRILOSEC OTC) 20 MG tablet Take 20 mg by mouth 2 (two) times daily.    . OXYGEN Inhale 2 L into the lungs at bedtime.    . sucralfate (CARAFATE) 1 G tablet Take 1 g by mouth 4 (four) times daily.     Marland Kitchen nystatin (MYCOSTATIN/NYSTOP) 100000 UNIT/GM POWD Apply topically to rash underneath breasts twice a day. 15 g 1   No current facility-administered medications for this visit.    Review of Systems:  GENERAL:  Feels pretty good.  No fevers, sweats or weight loss. PERFORMANCE STATUS (ECOG):  1 HEENT:  No visual changes, runny nose, sore throat, mouth sores or tenderness. Lungs: No shortness of breath or cough.  No hemoptysis. Cardiac:  No chest pain, palpitations, orthopnea, or PND. Breasts:  Shooting pain through right nipple, resolved.  Tender breasts (right > left).  GI:  Indigestion.  History of colon polyps.  No nausea, vomiting, diarrhea, constipation, melena or hematochezia. GU:  No urgency, frequency, dysuria, or hematuria. Musculoskeletal:  No back pain.  No joint pain.  No muscle tenderness. Extremities:  No pain or swelling. Skin:  No rashes or skin changes. Neuro:  No headache, numbness or weakness, balance or coordination issues. Endocrine:  No diabetes, thyroid issues, hot flashes or night sweats. Psych:  No mood changes, depression  or anxiety. Pain:  No focal pain. Review of systems:  All other systems reviewed and found to be negative.   Physical Exam: Blood pressure 134/95, pulse 72, temperature 98.7 F (37.1 C), temperature source Tympanic, resp. rate 18, height 5' 5.5" (1.664 m), weight 257 lb 8 oz (116.8 kg). GENERAL:  Well developed, well nourished, heavyset woman sitting comfortably in the exam room in no acute distress. MENTAL STATUS:  Alert and oriented to person, place and time. HEAD:  Short styled graying hair.  Normocephalic, atraumatic, face symmetric, no Cushingoid features. EYES:  Blue eyes.  Pupils equal round  and reactive to light and accomodation.  No conjunctivitis or scleral icterus. ENT:  Oropharynx clear without lesion.  Tongue normal. Mucous membranes moist.  RESPIRATORY:  Clear to auscultation without rales, wheezes or rhonchi. CARDIOVASCULAR:  Regular rate and rhythm without murmur, rub or gallop. BREAST:  Right breast with well healed medial periareolar incision. Tender inferior quadrant post-operative and post-radiation changes.  No discrete masses, skin changes or nipple discharge.  Left breast with moderate fibrocystic changes (tender).  No masses, skin changes or nipple discharge.   ABDOMEN:  Soft, non-tender, with active bowel sounds, and no appreciable hepatosplenomegaly.  No masses. BACK:  No tenderness on percussion of the back. SKIN:  Candidal fungal infection beneath breasts (left > right).  No ulcers or lesions. EXTREMITIES: No edema, no skin discoloration or tenderness.  No palpable cords. LYMPH NODES: No palpable cervical, supraclavicular, axillary or inguinal adenopathy  NEUROLOGICAL: Unremarkable. PSYCH:  Appropriate.   Appointment on 01/04/2016  Component Date Value Ref Range Status  . WBC 01/04/2016 8.5  3.6 - 11.0 K/uL Final  . RBC 01/04/2016 4.39  3.80 - 5.20 MIL/uL Final  . Hemoglobin 01/04/2016 12.1  12.0 - 16.0 g/dL Final  . HCT 71/95/9747 36.4  35.0 - 47.0 % Final  .  MCV 01/04/2016 82.9  80.0 - 100.0 fL Final  . MCH 01/04/2016 27.5  26.0 - 34.0 pg Final  . MCHC 01/04/2016 33.2  32.0 - 36.0 g/dL Final  . RDW 18/55/0158 12.8  11.5 - 14.5 % Final  . Platelets 01/04/2016 342  150 - 440 K/uL Final  . Neutrophils Relative % 01/04/2016 69   Final  . Neutro Abs 01/04/2016 5.9  1.4 - 6.5 K/uL Final  . Lymphocytes Relative 01/04/2016 22   Final  . Lymphs Abs 01/04/2016 1.9  1.0 - 3.6 K/uL Final  . Monocytes Relative 01/04/2016 6   Final  . Monocytes Absolute 01/04/2016 0.5  0.2 - 0.9 K/uL Final  . Eosinophils Relative 01/04/2016 2   Final  . Eosinophils Absolute 01/04/2016 0.2  0 - 0.7 K/uL Final  . Basophils Relative 01/04/2016 1   Final  . Basophils Absolute 01/04/2016 0.1  0 - 0.1 K/uL Final  . Sodium 01/04/2016 128* 135 - 145 mmol/L Final  . Potassium 01/04/2016 4.1  3.5 - 5.1 mmol/L Final  . Chloride 01/04/2016 96* 101 - 111 mmol/L Final  . CO2 01/04/2016 24  22 - 32 mmol/L Final  . Glucose, Bld 01/04/2016 253* 65 - 99 mg/dL Final  . BUN 68/25/7493 15  6 - 20 mg/dL Final  . Creatinine, Ser 01/04/2016 0.74  0.44 - 1.00 mg/dL Final  . Calcium 55/21/7471 8.9  8.9 - 10.3 mg/dL Final  . Total Protein 01/04/2016 7.9  6.5 - 8.1 g/dL Final  . Albumin 59/53/9672 3.6  3.5 - 5.0 g/dL Final  . AST 89/79/1504 19  15 - 41 U/L Final  . ALT 01/04/2016 18  14 - 54 U/L Final  . Alkaline Phosphatase 01/04/2016 70  38 - 126 U/L Final  . Total Bilirubin 01/04/2016 0.5  0.3 - 1.2 mg/dL Final  . GFR calc non Af Amer 01/04/2016 >60  >60 mL/min Final  . GFR calc Af Amer 01/04/2016 >60  >60 mL/min Final   Comment: (NOTE) The eGFR has been calculated using the CKD EPI equation. This calculation has not been validated in all clinical situations. eGFR's persistently <60 mL/min signify possible Chronic Kidney Disease.   . Anion gap 01/04/2016 8  5 - 15  Final  . CA 27.29 01/04/2016 25.8  0.0 - 38.6 U/mL Final   Comment: (NOTE) [facility]/ACS methodology Performed At: [facility] [address] [place], [place] [id number] Lindon Romp MD [id number]     Assessment:  Hailey Hawkins is a 70 y.o. 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  Pathology revealed a 1.4 cm focus of grade 3 comedo type DCIS with microcalcifications and desmoplastic stromal response.  There was no evidence of invasive carcinoma. Margins were clear.  Tumor was ER was negative (< 1%) and PR was negative (0%).  She completed radiation to her right breast on 04/04/2014.   Mammogram on 12/17/2015 revealed a new group of 6 mm calcifications of the right breastwhich appeared around a central lucency suggestive of fat necrosis.  She has a history of iron deficiency.  She is on oral iron.  She has an EGD and colonoscopy every 5 years (next due 03/2016).  She has chronic hyponatremia.  Symptomatically, she feels good.  Exam reveals tender fibrocystic changes.  She has a mild Candidal fungal infection beneath her breasts.  Sodium is 128.  Plan: 1. Labs today:  CBC with diff, CMP, CA27.29. 2. Review interval mammogram.  Discuss likely fat necrosis. Discuss importance of 6 month follow-up imaging. 3. Schedule right sided mammogram on 06/16/2016. 4. Rx:  Nystatin powder apply topically to rash beneath breasts (dis: 15 gm with 1 refill). 5. Review labs and recurrent hyponatremia. 6. RTC in 6 months for MD assess, labs (CBC with diff, CMP), and review of interval mammogram.   Lequita Asal, MD  01/04/2016

## 2016-01-11 ENCOUNTER — Other Ambulatory Visit: Payer: Self-pay

## 2016-01-11 DIAGNOSIS — Z6841 Body Mass Index (BMI) 40.0 and over, adult: Secondary | ICD-10-CM

## 2016-01-11 DIAGNOSIS — D0511 Intraductal carcinoma in situ of right breast: Secondary | ICD-10-CM

## 2016-02-07 DIAGNOSIS — I350 Nonrheumatic aortic (valve) stenosis: Secondary | ICD-10-CM | POA: Insufficient documentation

## 2016-03-26 DIAGNOSIS — Z86010 Personal history of colonic polyps: Secondary | ICD-10-CM | POA: Insufficient documentation

## 2016-06-17 ENCOUNTER — Ambulatory Visit
Admission: RE | Admit: 2016-06-17 | Discharge: 2016-06-17 | Disposition: A | Discharge status: Home / Self Care | Payer: Medicare Other | Source: Ambulatory Visit | Attending: Hematology and Oncology | Admitting: Hematology and Oncology

## 2016-06-17 ENCOUNTER — Other Ambulatory Visit: Payer: Self-pay | Admitting: Hematology and Oncology

## 2016-06-17 DIAGNOSIS — D0511 Intraductal carcinoma in situ of right breast: Secondary | ICD-10-CM

## 2016-06-17 DIAGNOSIS — R921 Mammographic calcification found on diagnostic imaging of breast: Secondary | ICD-10-CM | POA: Insufficient documentation

## 2016-06-17 HISTORY — DX: Unspecified injury of thorax, initial encounter: S29.9XXA

## 2016-06-18 ENCOUNTER — Other Ambulatory Visit: Payer: Self-pay

## 2016-06-18 DIAGNOSIS — D0511 Intraductal carcinoma in situ of right breast: Secondary | ICD-10-CM

## 2016-06-23 ENCOUNTER — Encounter: Payer: Self-pay | Admitting: Radiation Oncology

## 2016-06-23 ENCOUNTER — Ambulatory Visit
Admission: RE | Admit: 2016-06-23 | Discharge: 2016-06-23 | Disposition: A | Discharge status: Home / Self Care | Payer: Medicare Other | Source: Ambulatory Visit | Attending: Radiation Oncology | Admitting: Radiation Oncology

## 2016-06-23 VITALS — BP 167/77 | HR 68 | Temp 98.1°F | Resp 20 | Wt 246.4 lb

## 2016-06-23 DIAGNOSIS — Z853 Personal history of malignant neoplasm of breast: Secondary | ICD-10-CM | POA: Diagnosis present

## 2016-06-23 DIAGNOSIS — Z923 Personal history of irradiation: Secondary | ICD-10-CM | POA: Diagnosis not present

## 2016-06-23 DIAGNOSIS — C50911 Malignant neoplasm of unspecified site of right female breast: Secondary | ICD-10-CM

## 2016-06-23 NOTE — Progress Notes (Signed)
Radiation Oncology Follow up Note  Name: Hailey Hawkins   Date:   06/23/2016 MRN:  [id number] DOB: [birth year]    This 70 y.o. female presents to the clinic today for 2 year follow-up for ductal carcinoma in situ status post whole breast radiation.  REFERRING PROVIDER: Idelle Crouch, MD  HPI: Patient is a 70-year-old female now out 2 years having completed whole breast radiation to her right breast for ductal carcinoma in situ ER/PR negative. She seen today in routine follow-up and is doing well.. She's been followed rather closely for calcifications in the medial aspect the right breast which according to radiology are benign and consistent with evolving fat necrosis. She specifically denies breast tenderness cough or bone pain.  COMPLICATIONS OF TREATMENT: none  FOLLOW UP COMPLIANCE: keeps appointments   PHYSICAL EXAM:  BP 167/77 mmHg  Pulse 68  Temp(Src) 98.1 F (36.7 C)  Resp 20  Wt 246 lb 5.8 oz (111.75 kg) patient is obese Patient has large pendulous breasts. No dominant mass or nodularity is noted in either breast in 2 positions examined. No axillary or supraclavicular adenopathy is appreciated. Well-developed well-nourished patient in NAD. HEENT reveals PERLA, EOMI, discs not visualized.  Oral cavity is clear. No oral mucosal lesions are identified. Neck is clear without evidence of cervical or supraclavicular adenopathy. Lungs are clear to A&P. Cardiac examination is essentially unremarkable with regular rate and rhythm without murmur rub or thrill. Abdomen is benign with no organomegaly or masses noted. Motor sensory and DTR levels are equal and symmetric in the upper and lower extremities. Cranial nerves II through XII are grossly intact. Proprioception is intact. No peripheral adenopathy or edema is identified. No motor or sensory levels are noted. Crude visual fields are within normal range.  RADIOLOGY RESULTS: Recent mammograms are reviewed compatible with the  above-stated findings  PLAN: Present time patient is doing well with no evidence of disease. She'll have a follow-up mammogram in 6 months. Otherwise she is doing well and is without complaint with no evidence of disease. I have asked to see her back in 1 year for follow-up. Patient knows to call sooner with any concerns.  I would like to take this opportunity to thank you for allowing me to participate in the care of your patient.Armstead Peaks., MD

## 2016-07-04 ENCOUNTER — Encounter: Payer: Self-pay | Admitting: Hematology and Oncology

## 2016-07-04 ENCOUNTER — Other Ambulatory Visit: Payer: Self-pay

## 2016-07-04 ENCOUNTER — Inpatient Hospital Stay (HOSPITAL_BASED_OUTPATIENT_CLINIC_OR_DEPARTMENT_OTHER): Payer: Medicare Other | Admitting: Hematology and Oncology

## 2016-07-04 ENCOUNTER — Inpatient Hospital Stay: Payer: Medicare Other | Attending: Hematology and Oncology

## 2016-07-04 VITALS — BP 154/88 | HR 90 | Temp 98.0°F | Ht 65.0 in | Wt 247.0 lb

## 2016-07-04 DIAGNOSIS — Z171 Estrogen receptor negative status [ER-]: Secondary | ICD-10-CM

## 2016-07-04 DIAGNOSIS — Z800 Family history of malignant neoplasm of digestive organs: Secondary | ICD-10-CM | POA: Diagnosis not present

## 2016-07-04 DIAGNOSIS — Z9181 History of falling: Secondary | ICD-10-CM | POA: Insufficient documentation

## 2016-07-04 DIAGNOSIS — N6019 Diffuse cystic mastopathy of unspecified breast: Secondary | ICD-10-CM | POA: Diagnosis not present

## 2016-07-04 DIAGNOSIS — D0511 Intraductal carcinoma in situ of right breast: Secondary | ICD-10-CM

## 2016-07-04 DIAGNOSIS — Z79899 Other long term (current) drug therapy: Secondary | ICD-10-CM | POA: Diagnosis not present

## 2016-07-04 DIAGNOSIS — Z86000 Personal history of in-situ neoplasm of breast: Secondary | ICD-10-CM | POA: Diagnosis not present

## 2016-07-04 DIAGNOSIS — E119 Type 2 diabetes mellitus without complications: Secondary | ICD-10-CM

## 2016-07-04 DIAGNOSIS — E871 Hypo-osmolality and hyponatremia: Secondary | ICD-10-CM | POA: Insufficient documentation

## 2016-07-04 DIAGNOSIS — K219 Gastro-esophageal reflux disease without esophagitis: Secondary | ICD-10-CM | POA: Insufficient documentation

## 2016-07-04 DIAGNOSIS — D509 Iron deficiency anemia, unspecified: Secondary | ICD-10-CM | POA: Insufficient documentation

## 2016-07-04 DIAGNOSIS — N641 Fat necrosis of breast: Secondary | ICD-10-CM | POA: Insufficient documentation

## 2016-07-04 DIAGNOSIS — Z923 Personal history of irradiation: Secondary | ICD-10-CM | POA: Diagnosis not present

## 2016-07-04 DIAGNOSIS — I10 Essential (primary) hypertension: Secondary | ICD-10-CM

## 2016-07-04 DIAGNOSIS — E039 Hypothyroidism, unspecified: Secondary | ICD-10-CM

## 2016-07-04 DIAGNOSIS — Z794 Long term (current) use of insulin: Secondary | ICD-10-CM | POA: Diagnosis not present

## 2016-07-04 DIAGNOSIS — R921 Mammographic calcification found on diagnostic imaging of breast: Secondary | ICD-10-CM | POA: Diagnosis not present

## 2016-07-04 LAB — CBC WITH DIFFERENTIAL/PLATELET
Basophils Absolute: 0 10*3/uL (ref 0–0.1)
Basophils Relative: 1 %
Eosinophils Absolute: 0.2 10*3/uL (ref 0–0.7)
Eosinophils Relative: 3 %
HCT: 34.9 % — ABNORMAL LOW (ref 35.0–47.0)
Hemoglobin: 12 g/dL (ref 12.0–16.0)
Lymphocytes Relative: 23 %
Lymphs Abs: 1.8 10*3/uL (ref 1.0–3.6)
MCH: 28.6 pg (ref 26.0–34.0)
MCHC: 34.4 g/dL (ref 32.0–36.0)
MCV: 83.3 fL (ref 80.0–100.0)
Monocytes Absolute: 0.6 10*3/uL (ref 0.2–0.9)
Monocytes Relative: 7 %
Neutro Abs: 5.1 10*3/uL (ref 1.4–6.5)
Neutrophils Relative %: 66 %
Platelets: 328 10*3/uL (ref 150–440)
RBC: 4.18 MIL/uL (ref 3.80–5.20)
RDW: 12.4 % (ref 11.5–14.5)
WBC: 7.7 10*3/uL (ref 3.6–11.0)

## 2016-07-04 LAB — COMPREHENSIVE METABOLIC PANEL
ALT: 11 U/L — ABNORMAL LOW (ref 14–54)
AST: 20 U/L (ref 15–41)
Albumin: 3.8 g/dL (ref 3.5–5.0)
Alkaline Phosphatase: 53 U/L (ref 38–126)
Anion gap: 8 (ref 5–15)
BUN: 11 mg/dL (ref 6–20)
CO2: 23 mmol/L (ref 22–32)
Calcium: 9.1 mg/dL (ref 8.9–10.3)
Chloride: 98 mmol/L — ABNORMAL LOW (ref 101–111)
Creatinine, Ser: 0.65 mg/dL (ref 0.44–1.00)
GFR calc Af Amer: >60 mL/min (ref >60)
GFR calc non Af Amer: >60 mL/min (ref >60)
Glucose, Bld: 166 mg/dL — ABNORMAL HIGH (ref 65–99)
Potassium: 4.2 mmol/L (ref 3.5–5.1)
Sodium: 129 mmol/L — ABNORMAL LOW (ref 135–145)
Total Bilirubin: 0.5 mg/dL (ref 0.3–1.2)
Total Protein: 7.8 g/dL (ref 6.5–8.1)

## 2016-07-04 NOTE — Progress Notes (Signed)
Patient here for follow up no change since last visit.

## 2016-07-04 NOTE — Progress Notes (Signed)
[facility] day:   07/04/2016   Chief Complaint: Hailey Hawkins is a 70 y.o. female with right breast ductal carcinoma in situ who is seen for 6 month assessment.  HPI:  The patient was last seen in the medical oncology clinic on 01/04/2016.  At that time, she felt good.  Exam revealed tender fibrocystic changes.  She had a mild Candidal fungal infection beneath her breasts.  Sodium was 128.  A prescription for topical nystatin was provided.  Right-sided mammogram on 06/17/2016 revealed calcifications within the medial right breasts benign consistent with evolving fat necrosis. There were no findings worrisome for malignancy. Recommendation was for bilateral diagnostic mammogram in 11/2016.   She states that she has a colonoscopy scheduled in 07/2016.   During the interim, she has done well.  She voices no concerns.    Past Medical History  Diagnosis Date  . Tachycardia   . GERD (gastroesophageal reflux disease)   . Back pain   . Anemia     pernicious  . Diabetes mellitus without complication ([place])   . Asthma   . Hypothyroidism   . Hypertension   . Breast injury Few Weeks ago    Patient fell a few weeks ago -- Rt breast may be tender when compressed  . Breast cancer [facility]) 2014    right c lumpectomy c radiation    Past Surgical History  Procedure Laterality Date  . Thyroidectomy, partial      caused throat paralysis  right side  . Replacement total knee Right   . Abdominal hysterectomy    . Replacement total knee Left   . Cesarean section      x2  . Mastectomy, partial Right   . Breast biopsy Right 2014    Family History  Problem Relation Age of Onset  . Breast cancer Neg Hx   . Cancer Daughter     colon cancer; age 42    Social History:  reports that she has never smoked. She does not have any smokeless tobacco history on file. She reports that she does not drink alcohol or use illicit drugs.  The patient is alone  today.  Allergies:  Allergies  Allergen Reactions  . Methamphetamine Shortness Of Breath    Difficulty breathing (INHALER)  . Amoxicillin     Other reaction(s): Diarrhea and vomiting (finding)  . Antihistamines, Chlorpheniramine-Type     Other reaction(s): Other (See Comments) Severe dryness  . Ciprofloxacin     Other reaction(s): Distress (finding)  . Codeine     Other reaction(s): Diarrhea and vomiting (finding), Weal or any derivitive  . Other Other (See Comments)    Birth control pills - blood clots  . Penicillin G     Other reaction(s): Weal  . Meloxicam Palpitations    Current Medications: Current Outpatient Prescriptions  Medication Sig Dispense Refill  . acetaminophen (TYLENOL) 500 MG tablet Take 1,000 mg by mouth every 6 (six) hours as needed for mild pain.    Marland Kitchen albuterol (PROVENTIL HFA;VENTOLIN HFA) 108 (90 BASE) MCG/ACT inhaler Inhale 2 puffs into the lungs every 4 (four) hours as needed for wheezing or shortness of breath.    . ALPRAZolam (XANAX) 0.5 MG tablet Take 0.5 mg by mouth daily.    . cyanocobalamin 1000 MCG tablet Inject 1,000 mcg into the muscle every 30 (thirty) days.    Marland Kitchen dextromethorphan-guaiFENesin (MUCINEX DM) 30-600 MG per 12 hr tablet Take 1 tablet by mouth 2 (  two) times daily as needed for cough.    . diphenhydramine-acetaminophen (TYLENOL PM) 25-500 MG TABS Take 2 tablets by mouth at bedtime as needed.    . docusate sodium (COLACE) 100 MG capsule Take 100 mg by mouth 2 (two) times daily.    . ferrous sulfate 325 (65 FE) MG tablet Take 325 mg by mouth 2 (two) times daily with a meal.    . Fluticasone-Salmeterol (ADVAIR) 250-50 MCG/DOSE AEPB Inhale 1 puff into the lungs 2 (two) times daily.    . insulin glargine (LANTUS) 100 UNIT/ML injection Inject 30 Units into the skin at bedtime.    Marland Kitchen levothyroxine (SYNTHROID, LEVOTHROID) 100 MCG tablet Take 100 mcg by mouth daily before breakfast.    . losartan (COZAAR) 50 MG tablet Take 50 mg by mouth 2 (two)  times daily.    . magnesium oxide (MAG-OX) 400 MG tablet     . metFORMIN (GLUCOPHAGE) 1000 MG tablet Take 1,000 mg by mouth 2 (two) times daily with a meal.    . metoprolol succinate (TOPROL-XL) 50 MG 24 hr tablet Take 50 mg by mouth daily. Take with or immediately following a meal.    . montelukast (SINGULAIR) 10 MG tablet Take 10 mg by mouth at bedtime.    Marland Kitchen nystatin (MYCOSTATIN/NYSTOP) 100000 UNIT/GM POWD Apply topically to rash underneath breasts twice a day. 15 g 1  . omeprazole (PRILOSEC OTC) 20 MG tablet Take 20 mg by mouth 2 (two) times daily.    . OXYGEN Inhale 2 L into the lungs at bedtime.    . sucralfate (CARAFATE) 1 G tablet Take 1 g by mouth 4 (four) times daily.     Marland Kitchen escitalopram (LEXAPRO) 10 MG tablet Take by mouth.    . hydrALAZINE (APRESOLINE) 50 MG tablet Take by mouth 3 (three) times daily.      No current facility-administered medications for this visit.    Review of Systems:  GENERAL:  Feels good.  No fevers or sweats.  Weight down 10 pounds. PERFORMANCE STATUS (ECOG):  1 HEENT:  No visual changes, runny nose, sore throat, mouth sores or tenderness. Lungs: No shortness of breath or cough.  No hemoptysis. Cardiac:  No chest pain, palpitations, orthopnea, or PND. Breasts:  Tender breasts (right > left).  No nipple discharge.  Mammogram 06/17/2016 with f/u planned in 11/2016 GI:  History of colon polyps.  No nausea, vomiting, diarrhea, constipation, melena or hematochezia.  Colonoscopy planned in 07/2016. GU:  No urgency, frequency, dysuria, or hematuria. Musculoskeletal:  No back pain.  No joint pain.  No muscle tenderness. Extremities:  No pain or swelling. Skin:  No rashes or skin changes. Neuro:  No headache, numbness or weakness, balance or coordination issues. Endocrine:  Diabetes. Thyroid issues on Synthroid.  No hot flashes or night sweats. Psych:  No mood changes, depression or anxiety. Pain:  No focal pain. Review of systems:  All other systems reviewed and  found to be negative.   Physical Exam: Blood pressure 154/88, pulse 90, temperature 98 F (36.7 C), temperature source Tympanic, height '5\' 5"'$  (1.651 m), weight 247 lb 0.4 oz (112.05 kg). GENERAL:  Well developed, well nourished, heavyset woman sitting comfortably in the exam room in no acute distress. MENTAL STATUS:  Alert and oriented to person, place and time. HEAD:  Short gray hair.  Normocephalic, atraumatic, face symmetric, no Cushingoid features. EYES:  Blue eyes.  Pupils equal round and reactive to light and accomodation.  No conjunctivitis or scleral icterus. ENT:  Oropharynx clear without lesion.  Tongue normal. Mucous membranes moist.  RESPIRATORY:  Clear to auscultation without rales, wheezes or rhonchi. CARDIOVASCULAR:  Regular rate and rhythm without murmur, rub or gallop. BREAST:  Right breast with well healed medial periareolar incision. Tender inferior quadrant post-operative and post-radiation changes.  No discrete masses, skin changes or nipple discharge.  Left breast with moderate tender fibrocystic changes.  No masses, skin changes or nipple discharge.   ABDOMEN:  Soft, non-tender, with active bowel sounds, and no appreciable hepatosplenomegaly.  No masses. SKIN:  No ulcers or lesions. EXTREMITIES: No edema, no skin discoloration or tenderness.  No palpable cords. LYMPH NODES: No palpable cervical, supraclavicular, axillary or inguinal adenopathy  NEUROLOGICAL: Unremarkable. PSYCH:  Appropriate.    Orders Only on 07/04/2016  Component Date Value Ref Range Status  . WBC 07/04/2016 7.7  3.6 - 11.0 K/uL Final  . RBC 07/04/2016 4.18  3.80 - 5.20 MIL/uL Final  . Hemoglobin 07/04/2016 12.0  12.0 - 16.0 g/dL Final  . HCT 07/04/2016 34.9* 35.0 - 47.0 % Final  . MCV 07/04/2016 83.3  80.0 - 100.0 fL Final  . MCH 07/04/2016 28.6  26.0 - 34.0 pg Final  . MCHC 07/04/2016 34.4  32.0 - 36.0 g/dL Final  . RDW 07/04/2016 12.4  11.5 - 14.5 % Final  . Platelets 07/04/2016 328  150 -  440 K/uL Final  . Neutrophils Relative % 07/04/2016 66   Final  . Neutro Abs 07/04/2016 5.1  1.4 - 6.5 K/uL Final  . Lymphocytes Relative 07/04/2016 23   Final  . Lymphs Abs 07/04/2016 1.8  1.0 - 3.6 K/uL Final  . Monocytes Relative 07/04/2016 7   Final  . Monocytes Absolute 07/04/2016 0.6  0.2 - 0.9 K/uL Final  . Eosinophils Relative 07/04/2016 3   Final  . Eosinophils Absolute 07/04/2016 0.2  0 - 0.7 K/uL Final  . Basophils Relative 07/04/2016 1   Final  . Basophils Absolute 07/04/2016 0.0  0 - 0.1 K/uL Final  . Sodium 07/04/2016 129* 135 - 145 mmol/L Final  . Potassium 07/04/2016 4.2  3.5 - 5.1 mmol/L Final  . Chloride 07/04/2016 98* 101 - 111 mmol/L Final  . CO2 07/04/2016 23  22 - 32 mmol/L Final  . Glucose, Bld 07/04/2016 166* 65 - 99 mg/dL Final  . BUN 07/04/2016 11  6 - 20 mg/dL Final  . Creatinine, Ser 07/04/2016 0.65  0.44 - 1.00 mg/dL Final  . Calcium 07/04/2016 9.1  8.9 - 10.3 mg/dL Final  . Total Protein 07/04/2016 7.8  6.5 - 8.1 g/dL Final  . Albumin 07/04/2016 3.8  3.5 - 5.0 g/dL Final  . AST 07/04/2016 20  15 - 41 U/L Final  . ALT 07/04/2016 11* 14 - 54 U/L Final  . Alkaline Phosphatase 07/04/2016 53  38 - 126 U/L Final  . Total Bilirubin 07/04/2016 0.5  0.3 - 1.2 mg/dL Final  . GFR calc non Af Amer 07/04/2016 >60  >60 mL/min Final  . GFR calc Af Amer 07/04/2016 >60  >60 mL/min Final   Comment: (NOTE) The eGFR has been calculated using the CKD EPI equation. This calculation has not been validated in all clinical situations. eGFR's persistently <60 mL/min signify possible Chronic Kidney Disease.   . Anion gap 07/04/2016 8  5 - 15 Final    Assessment:  Hailey Hawkins is a 70 y.o. 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  Pathology revealed a 1.4 cm focus of grade  3 comedo type DCIS with microcalcifications and desmoplastic stromal response.  There was no evidence of invasive carcinoma. Margins were clear.  Tumor was ER was negative (< 1%) and  PR was negative (0%).  She completed radiation to her right breast on 04/04/2014.   Mammogram on 12/17/2015 revealed a new group of 6 mm calcifications of the right breast which appeared around a central lucency suggestive of fat necrosis.  Right-sided mammogram on 06/17/2016 revealed calcifications within the medial right breasts benign consistent with evolving fat necrosis. There were no findings worrisome for malignancy.  She has a history of iron deficiency.  She is on oral iron.  She has an EGD and colonoscopy every 5 years (next due 03/2016).  She has chronic hyponatremia.  Symptomatically, she feels good.  Exam reveals chronic tender fibrocystic changes.  Sodium is 129.  Plan: 1.  Labs today:  CBC with diff, CMP. 2.  Review 06/17/2016 mammogram.  Discuss evolving fat necrosis. Discuss 6 month follow-up imaging in 11/2016. 3.  Follow-up mammogram 11/2016. 4.  RTC in 6 months for MD assessment and labs (CBC with diff, CMP).   Lequita Asal, MD  07/04/2016, 3:59 PM

## 2016-08-13 ENCOUNTER — Encounter: Payer: Self-pay | Admitting: *Deleted

## 2016-08-14 ENCOUNTER — Ambulatory Visit
Admission: RE | Admit: 2016-08-14 | Discharge: 2016-08-14 | Disposition: A | Discharge status: Home / Self Care | Payer: Medicare Other | Source: Ambulatory Visit | Attending: Unknown Physician Specialty | Admitting: Unknown Physician Specialty

## 2016-08-14 ENCOUNTER — Ambulatory Visit: Payer: Medicare Other | Admitting: Anesthesiology

## 2016-08-14 ENCOUNTER — Encounter
Admission: RE | Disposition: A | Discharge status: Home / Self Care | Payer: Self-pay | Source: Ambulatory Visit | Attending: Unknown Physician Specialty

## 2016-08-14 DIAGNOSIS — Z9889 Other specified postprocedural states: Secondary | ICD-10-CM | POA: Insufficient documentation

## 2016-08-14 DIAGNOSIS — K2970 Gastritis, unspecified, without bleeding: Secondary | ICD-10-CM | POA: Diagnosis not present

## 2016-08-14 DIAGNOSIS — Z79899 Other long term (current) drug therapy: Secondary | ICD-10-CM | POA: Insufficient documentation

## 2016-08-14 DIAGNOSIS — J45909 Unspecified asthma, uncomplicated: Secondary | ICD-10-CM | POA: Insufficient documentation

## 2016-08-14 DIAGNOSIS — E222 Syndrome of inappropriate secretion of antidiuretic hormone: Secondary | ICD-10-CM | POA: Diagnosis not present

## 2016-08-14 DIAGNOSIS — K640 First degree hemorrhoids: Secondary | ICD-10-CM | POA: Diagnosis not present

## 2016-08-14 DIAGNOSIS — Z86010 Personal history of colonic polyps: Secondary | ICD-10-CM | POA: Diagnosis present

## 2016-08-14 DIAGNOSIS — Z9011 Acquired absence of right breast and nipple: Secondary | ICD-10-CM | POA: Insufficient documentation

## 2016-08-14 DIAGNOSIS — K210 Gastro-esophageal reflux disease with esophagitis: Secondary | ICD-10-CM | POA: Diagnosis not present

## 2016-08-14 DIAGNOSIS — E669 Obesity, unspecified: Secondary | ICD-10-CM | POA: Insufficient documentation

## 2016-08-14 DIAGNOSIS — D649 Anemia, unspecified: Secondary | ICD-10-CM | POA: Diagnosis present

## 2016-08-14 DIAGNOSIS — I10 Essential (primary) hypertension: Secondary | ICD-10-CM | POA: Insufficient documentation

## 2016-08-14 DIAGNOSIS — D124 Benign neoplasm of descending colon: Secondary | ICD-10-CM | POA: Insufficient documentation

## 2016-08-14 DIAGNOSIS — Z1211 Encounter for screening for malignant neoplasm of colon: Secondary | ICD-10-CM | POA: Insufficient documentation

## 2016-08-14 DIAGNOSIS — Z923 Personal history of irradiation: Secondary | ICD-10-CM | POA: Diagnosis not present

## 2016-08-14 DIAGNOSIS — Z880 Allergy status to penicillin: Secondary | ICD-10-CM | POA: Insufficient documentation

## 2016-08-14 DIAGNOSIS — Z885 Allergy status to narcotic agent status: Secondary | ICD-10-CM | POA: Insufficient documentation

## 2016-08-14 DIAGNOSIS — E890 Postprocedural hypothyroidism: Secondary | ICD-10-CM | POA: Diagnosis not present

## 2016-08-14 DIAGNOSIS — E063 Autoimmune thyroiditis: Secondary | ICD-10-CM | POA: Insufficient documentation

## 2016-08-14 DIAGNOSIS — D509 Iron deficiency anemia, unspecified: Secondary | ICD-10-CM | POA: Diagnosis not present

## 2016-08-14 DIAGNOSIS — Z888 Allergy status to other drugs, medicaments and biological substances status: Secondary | ICD-10-CM | POA: Insufficient documentation

## 2016-08-14 DIAGNOSIS — K317 Polyp of stomach and duodenum: Secondary | ICD-10-CM | POA: Insufficient documentation

## 2016-08-14 DIAGNOSIS — E7800 Pure hypercholesterolemia, unspecified: Secondary | ICD-10-CM | POA: Diagnosis not present

## 2016-08-14 DIAGNOSIS — Z96653 Presence of artificial knee joint, bilateral: Secondary | ICD-10-CM | POA: Insufficient documentation

## 2016-08-14 DIAGNOSIS — E119 Type 2 diabetes mellitus without complications: Secondary | ICD-10-CM | POA: Diagnosis not present

## 2016-08-14 DIAGNOSIS — Z7951 Long term (current) use of inhaled steroids: Secondary | ICD-10-CM | POA: Insufficient documentation

## 2016-08-14 DIAGNOSIS — Z90710 Acquired absence of both cervix and uterus: Secondary | ICD-10-CM | POA: Diagnosis not present

## 2016-08-14 DIAGNOSIS — Z8614 Personal history of Methicillin resistant Staphylococcus aureus infection: Secondary | ICD-10-CM | POA: Insufficient documentation

## 2016-08-14 DIAGNOSIS — K449 Diaphragmatic hernia without obstruction or gangrene: Secondary | ICD-10-CM | POA: Insufficient documentation

## 2016-08-14 DIAGNOSIS — Z6841 Body Mass Index (BMI) 40.0 and over, adult: Secondary | ICD-10-CM | POA: Diagnosis not present

## 2016-08-14 DIAGNOSIS — Z853 Personal history of malignant neoplasm of breast: Secondary | ICD-10-CM | POA: Insufficient documentation

## 2016-08-14 DIAGNOSIS — Z8719 Personal history of other diseases of the digestive system: Secondary | ICD-10-CM | POA: Diagnosis present

## 2016-08-14 DIAGNOSIS — Z886 Allergy status to analgesic agent status: Secondary | ICD-10-CM | POA: Insufficient documentation

## 2016-08-14 DIAGNOSIS — Z800 Family history of malignant neoplasm of digestive organs: Secondary | ICD-10-CM | POA: Insufficient documentation

## 2016-08-14 DIAGNOSIS — Z794 Long term (current) use of insulin: Secondary | ICD-10-CM | POA: Insufficient documentation

## 2016-08-14 DIAGNOSIS — Z881 Allergy status to other antibiotic agents status: Secondary | ICD-10-CM | POA: Insufficient documentation

## 2016-08-14 HISTORY — PX: ESOPHAGOGASTRODUODENOSCOPY (EGD) WITH PROPOFOL: SHX5813

## 2016-08-14 HISTORY — DX: Syndrome of inappropriate secretion of antidiuretic hormone: E22.2

## 2016-08-14 HISTORY — DX: Pure hypercholesterolemia, unspecified: E78.00

## 2016-08-14 HISTORY — PX: COLONOSCOPY: SHX5424

## 2016-08-14 HISTORY — DX: Intraductal carcinoma in situ of unspecified breast: D05.10

## 2016-08-14 HISTORY — DX: Methicillin resistant Staphylococcus aureus infection, unspecified site: A49.02

## 2016-08-14 HISTORY — DX: Autoimmune thyroiditis: E06.3

## 2016-08-14 HISTORY — DX: Shortness of breath: R06.02

## 2016-08-14 HISTORY — DX: Obesity, unspecified: E66.9

## 2016-08-14 LAB — GLUCOSE, CAPILLARY: Glucose-Capillary: 147 mg/dL — ABNORMAL HIGH (ref 65–99)

## 2016-08-14 SURGERY — ESOPHAGOGASTRODUODENOSCOPY (EGD) WITH PROPOFOL
Anesthesia: General

## 2016-08-14 MED ORDER — LIDOCAINE HCL (PF) 2 % IJ SOLN: INTRAMUSCULAR | Status: DC | PRN

## 2016-08-14 MED ORDER — LACTATED RINGERS IV SOLN
INTRAVENOUS | Status: DC | PRN
  Administered 2016-08-14: 09:00:00 via INTRAVENOUS

## 2016-08-14 MED ORDER — LIDOCAINE HCL (CARDIAC) 20 MG/ML IV SOLN
INTRAVENOUS | Status: DC | PRN
  Administered 2016-08-14: 40 mg via INTRAVENOUS

## 2016-08-14 MED ORDER — VANCOMYCIN HCL IN DEXTROSE 1-5 GM/200ML-% IV SOLN
1000.0000 mg | Freq: Once | INTRAVENOUS | Status: AC
  Administered 2016-08-14: 1000 mg via INTRAVENOUS
  Filled 2016-08-14: qty 200

## 2016-08-14 MED ORDER — SODIUM CHLORIDE 0.9 % IV SOLN
INTRAVENOUS | Status: DC
  Administered 2016-08-14: 07:00:00 via INTRAVENOUS

## 2016-08-14 MED ORDER — GENTAMICIN IN SALINE 1-0.9 MG/ML-% IV SOLN
100.0000 mg | Freq: Once | INTRAVENOUS | Status: DC
  Filled 2016-08-14: qty 100

## 2016-08-14 MED ORDER — PROPOFOL 10 MG/ML IV BOLUS
INTRAVENOUS | Status: DC | PRN
  Administered 2016-08-14: 70 mg via INTRAVENOUS

## 2016-08-14 MED ORDER — PROPOFOL 500 MG/50ML IV EMUL
INTRAVENOUS | Status: DC | PRN
  Administered 2016-08-14: 100 ug/kg/min via INTRAVENOUS

## 2016-08-14 MED ORDER — SODIUM CHLORIDE 0.9 % IV SOLN: INTRAVENOUS | Status: DC

## 2016-08-14 MED ORDER — GENTAMICIN SULFATE 40 MG/ML IJ SOLN
100.0000 mg | Freq: Once | INTRAMUSCULAR | Status: AC
  Administered 2016-08-14: 100 mg via INTRAVENOUS
  Filled 2016-08-14: qty 2.5

## 2016-08-14 NOTE — Anesthesia Postprocedure Evaluation (Signed)
Anesthesia Post Note  Patient: Hailey Hawkins  Procedure(s) Performed: Procedure(s) (LRB): ESOPHAGOGASTRODUODENOSCOPY (EGD) WITH PROPOFOL (N/A) COLONOSCOPY (N/A)  Patient location during evaluation: Endoscopy Anesthesia Type: General Level of consciousness: awake and alert and oriented Pain management: pain level controlled Vital Signs Assessment: post-procedure vital signs reviewed and stable Respiratory status: spontaneous breathing, nonlabored ventilation and respiratory function stable Cardiovascular status: blood pressure returned to baseline and stable Postop Assessment: no signs of nausea or vomiting Anesthetic complications: no    Last Vitals:  Vitals:   08/14/16 0723 08/14/16 0945  BP: 120/68 117/67  Pulse: 60 64  Resp: 19 18  Temp: 36.9 C 36.8 C    Last Pain:  Vitals:   08/14/16 0945  TempSrc: Tympanic                 Damien Batty

## 2016-08-14 NOTE — Anesthesia Preprocedure Evaluation (Signed)
Anesthesia Evaluation  Patient identified by MRN, date of birth, ID band Patient awake    Reviewed: Allergy & Precautions, NPO status , Patient's Chart, lab work & pertinent test results, reviewed documented beta blocker date and time   History of Anesthesia Complications Negative for: history of anesthetic complications  Airway Mallampati: III  TM Distance: >3 FB Neck ROM: Full    Dental  (+) Poor Dentition   Pulmonary Long-Term Oxygen Therapy, asthma (uses advair for controller, no recent flairs) , neg sleep apnea,    breath sounds clear to auscultation- rhonchi (-) wheezing      Cardiovascular hypertension, Pt. on medications and Pt. on home beta blockers (-) angina(-) CAD and (-) Past MI  Rhythm:Regular Rate:Normal - Systolic murmurs and - Diastolic murmurs    Neuro/Psych negative neurological ROS  negative psych ROS   GI/Hepatic Neg liver ROS, GERD  Medicated,  Endo/Other  diabetes, Type 2, Insulin DependentHypothyroidism   Renal/GU negative Renal ROS     Musculoskeletal   Abdominal (+) + obese,   Peds  Hematology  (+) anemia ,   Anesthesia Other Findings   Reproductive/Obstetrics                             Anesthesia Physical Anesthesia Plan  ASA: III  Anesthesia Plan: General   Post-op Pain Management:    Induction: Intravenous  Airway Management Planned: Natural Airway  Additional Equipment:   Intra-op Plan:   Post-operative Plan:   Informed Consent: I have reviewed the patients History and Physical, chart, labs and discussed the procedure including the risks, benefits and alternatives for the proposed anesthesia with the patient or authorized representative who has indicated his/her understanding and acceptance.   Dental advisory given  Plan Discussed with: Anesthesiologist  Anesthesia Plan Comments:         Anesthesia Quick Evaluation

## 2016-08-14 NOTE — Op Note (Signed)
[facility] Patient Name: Hailey Hawkins Procedure Date: 08/14/2016 8:53 AM MRN: [id number] Account #: [id number] Date of Birth: [birth year] Admit Type: Outpatient Age: 70 Room: [facility] ENDO ROOM 1 Gender: Female Note Status: Finalized Procedure:            Colonoscopy Indications:          High risk colon cancer surveillance: Personal history                        of colonic polyps Providers:            Manya Silvas, MD Referring MD:         Leonie Douglas. Doy Hutching, MD (Referring MD) Medicines:            Propofol per Anesthesia Complications:        No immediate complications. Procedure:            Pre-Anesthesia Assessment:                       - After reviewing the risks and benefits, the patient                        was deemed in satisfactory condition to undergo the                        procedure.                       After obtaining informed consent, the colonoscope was                        passed under direct vision. Throughout the procedure,                        the patient's blood pressure, pulse, and oxygen                        saturations were monitored continuously. The                        Colonoscope was introduced through the anus and                        advanced to the the cecum, identified by appendiceal                        orifice and ileocecal valve. The colonoscopy was                        performed without difficulty. The patient tolerated the                        procedure well. The quality of the bowel preparation                        was excellent. Findings:      A diminutive polyp was found in the descending colon. The polyp was       sessile. The polyp was removed with a jumbo cold forceps. Resection and       retrieval were complete.      Internal hemorrhoids were found  during endoscopy. The hemorrhoids were       small and Grade I (internal hemorrhoids that do not prolapse).      The exam was  otherwise without abnormality. Impression:           - One diminutive polyp in the descending colon, removed                        with a jumbo cold forceps. Resected and retrieved.                       - Internal hemorrhoids.                       - The examination was otherwise normal. Recommendation:       - Await pathology results. Manya Silvas, MD 08/14/2016 9:41:23 AM This report has been signed electronically. Number of Addenda: 0 Note Initiated On: 08/14/2016 8:53 AM Scope Withdrawal Time: 0 hours 8 minutes 56 seconds  Total Procedure Duration: 0 hours 13 minutes 37 seconds       [facility]

## 2016-08-14 NOTE — H&P (Signed)
Primary Care Physician:  Idelle Crouch, MD Primary Gastroenterologist:  Dr. Vira Agar  Pre-Procedure History & Physical: HPI:  Hailey Hawkins is a 70 y.o. female is here for an endoscopy and colonoscopy.   Past Medical History:  Diagnosis Date  . Anemia    pernicious  . Asthma   . Back pain   . Breast cancer ([place]) 2014   right c lumpectomy c radiation  . Breast injury Few Weeks ago   Patient fell a few weeks ago -- Rt breast may be tender when compressed  . DCIS (ductal carcinoma in situ) of breast   . Diabetes mellitus without complication ([place])   . Elevated cholesterol   . GERD (gastroesophageal reflux disease)   . Hashimoto's disease   . Hypertension   . Hypothyroidism   . MRSA infection   . Obesity goither  . SIADH (syndrome of inappropriate ADH production) ([place])   . SOB (shortness of breath)   . Tachycardia     Past Surgical History:  Procedure Laterality Date  . ABDOMINAL HYSTERECTOMY    . BREAST BIOPSY Right 2014  . CESAREAN SECTION     x2  . colononoscopy    . COLONOSCOPY WITH ESOPHAGOGASTRODUODENOSCOPY (EGD)    . MASTECTOMY    . MASTECTOMY, PARTIAL Right   . REPLACEMENT TOTAL KNEE Right   . REPLACEMENT TOTAL KNEE Left   . THYROIDECTOMY, PARTIAL     caused throat paralysis  right side  . TONSILLECTOMY      Prior to Admission medications   Medication Sig Start Date End Date Taking? Authorizing Provider  hydrALAZINE (APRESOLINE) 50 MG tablet Take by mouth 3 (three) times daily.  02/16/15 08/14/16 Yes Historical Provider, MD  levothyroxine (SYNTHROID, LEVOTHROID) 100 MCG tablet Take 100 mcg by mouth daily before breakfast.   Yes Historical Provider, MD  losartan (COZAAR) 50 MG tablet Take 50 mg by mouth 2 (two) times daily.   Yes Historical Provider, MD  metoprolol succinate (TOPROL-XL) 50 MG 24 hr tablet Take 50 mg by mouth daily. Take with or immediately following a meal.   Yes Historical Provider, MD  acetaminophen (TYLENOL) 500 MG tablet Take 1,000  mg by mouth every 6 (six) hours as needed for mild pain.    Historical Provider, MD  albuterol (PROVENTIL HFA;VENTOLIN HFA) 108 (90 BASE) MCG/ACT inhaler Inhale 2 puffs into the lungs every 4 (four) hours as needed for wheezing or shortness of breath.    Historical Provider, MD  ALPRAZolam Duanne Moron) 0.5 MG tablet Take 0.5 mg by mouth daily.    Historical Provider, MD  cyanocobalamin 1000 MCG tablet Inject 1,000 mcg into the muscle every 30 (thirty) days.    Historical Provider, MD  dextromethorphan-guaiFENesin (MUCINEX DM) 30-600 MG per 12 hr tablet Take 1 tablet by mouth 2 (two) times daily as needed for cough.    Historical Provider, MD  diphenhydramine-acetaminophen (TYLENOL PM) 25-500 MG TABS Take 2 tablets by mouth at bedtime as needed.    Historical Provider, MD  docusate sodium (COLACE) 100 MG capsule Take 100 mg by mouth 2 (two) times daily.    Historical Provider, MD  escitalopram (LEXAPRO) 10 MG tablet Take by mouth. 03/26/16 06/24/16  Historical Provider, MD  ferrous sulfate 325 (65 FE) MG tablet Take 325 mg by mouth 2 (two) times daily with a meal.    Historical Provider, MD  Fluticasone-Salmeterol (ADVAIR) 250-50 MCG/DOSE AEPB Inhale 1 puff into the lungs 2 (two) times daily.    Historical Provider, MD  insulin glargine (LANTUS) 100 UNIT/ML injection Inject 30 Units into the skin at bedtime.    Historical Provider, MD  magnesium oxide (MAG-OX) 400 MG tablet  05/08/16   Historical Provider, MD  metFORMIN (GLUCOPHAGE) 1000 MG tablet Take 1,000 mg by mouth 2 (two) times daily with a meal.    Historical Provider, MD  montelukast (SINGULAIR) 10 MG tablet Take 10 mg by mouth at bedtime.    Historical Provider, MD  nystatin (MYCOSTATIN/NYSTOP) 100000 UNIT/GM POWD Apply topically to rash underneath breasts twice a day. 01/04/16   Lequita Asal, MD  omeprazole (PRILOSEC OTC) 20 MG tablet Take 20 mg by mouth 2 (two) times daily.    Historical Provider, MD  OXYGEN Inhale 2 L into the lungs at  bedtime.    Historical Provider, MD  sucralfate (CARAFATE) 1 G tablet Take 1 g by mouth 4 (four) times daily.     Historical Provider, MD    Allergies as of 07/10/2016 - Review Complete 07/04/2016  Allergen Reaction Noted  . Methamphetamine Shortness Of Breath 06/19/2015  . Amoxicillin  04/21/2015  . Antihistamines, chlorpheniramine-type  06/19/2015  . Ciprofloxacin  04/21/2015  . Codeine  04/21/2015  . Other Other (See Comments) 06/19/2015  . Penicillin g  04/21/2015  . Meloxicam Palpitations 06/19/2015    Family History  Problem Relation Age of Onset  . Cancer Daughter     colon cancer; age 59  . Breast cancer Neg Hx     Social History   Social History  . Marital status: Married    Spouse name: N/A  . Number of children: N/A  . Years of education: N/A   Occupational History  . Not on file.   Social History Main Topics  . Smoking status: Never Smoker  . Smokeless tobacco: Never Used  . Alcohol use No  . Drug use: No  . Sexual activity: No   Other Topics Concern  . Not on file   Social History Narrative  . No narrative on file    Review of Systems: See HPI, otherwise negative ROS  Physical Exam: BP 120/68   Pulse 60   Temp 98.4 F (36.9 C) (Oral)   Resp 19   Ht '5\' 5"'$  (1.651 m)   Wt 111.1 kg (245 lb)   SpO2 99%   BMI 40.77 kg/m  General:   Alert,  pleasant and cooperative in NAD Head:  Normocephalic and atraumatic. Neck:  Supple; no masses or thyromegaly. Lungs:  Clear throughout to auscultation.    Heart:  Regular rate and rhythm. Abdomen:  Soft, nontender and nondistended. Normal bowel sounds, without guarding, and without rebound.   Neurologic:  Alert and  oriented x4;  grossly normal neurologically.  Impression/Plan: Hailey Hawkins is here for an endoscopy and colonoscopy to be performed for [facility] colon polyps and anemia and melena.  Risks, benefits, limitations, and alternatives regarding  endoscopy and colonoscopy have been reviewed with  the patient.  Questions have been answered.  All parties agreeable.   Gaylyn Cheers, MD  08/14/2016, 9:06 AM

## 2016-08-14 NOTE — Op Note (Signed)
[facility] Gastroenterology Patient Name: Hailey Hawkins Procedure Date: 08/14/2016 8:54 AM MRN: [id number] Account #: [id number] Date of Birth: [birth year] Admit Type: Outpatient Age: 70 Room: [facility] ENDO ROOM 1 Gender: Female Note Status: Finalized Procedure:            Upper GI endoscopy Indications:          Iron deficiency anemia, Melena Providers:            Manya Silvas, MD Referring MD:         Leonie Douglas. Doy Hutching, MD (Referring MD) Medicines:            Propofol per Anesthesia Complications:        No immediate complications. Procedure:            Pre-Anesthesia Assessment:                       - After reviewing the risks and benefits, the patient                        was deemed in satisfactory condition to undergo the                        procedure.                       After obtaining informed consent, the endoscope was                        passed under direct vision. Throughout the procedure,                        the patient's blood pressure, pulse, and oxygen                        saturations were monitored continuously. The Endoscope                        was introduced through the mouth, and advanced to the                        second part of duodenum. The upper GI endoscopy was                        accomplished without difficulty. The patient tolerated                        the procedure well. Findings:      A small hiatal hernia was present. Biopsies were taken with a cold       forceps for histology. Bx done to rule out Barretts. GEJ 40cm.      Diffuse black tiny spots and patchy moderate mucosal changes       characterized by discoloration were found in the gastric body. Biopsies       were taken with a cold forceps for histology.      The gastric antrum was normal.      The examined duodenum was normal. Impression:           - Small hiatal hernia. Biopsied.                       -  Discolored mucosa in the gastric body.  Biopsied.                       - Normal antrum.                       - Normal examined duodenum. Recommendation:       - Await pathology results.                       - Perform a colonoscopy as previously scheduled. Manya Silvas, MD 08/14/2016 9:23:29 AM This report has been signed electronically. Number of Addenda: 0 Note Initiated On: 08/14/2016 8:54 AM      [facility]

## 2016-08-14 NOTE — Transfer of Care (Signed)
Immediate Anesthesia Transfer of Care Note  Patient: Hailey Hawkins  Procedure(s) Performed: Procedure(s): ESOPHAGOGASTRODUODENOSCOPY (EGD) WITH PROPOFOL (N/A) COLONOSCOPY (N/A)  Patient Location: PACU and Endoscopy Unit  Anesthesia Type:General  Level of Consciousness: awake and alert   Airway & Oxygen Therapy: Patient Spontanous Breathing and Patient connected to nasal cannula oxygen  Post-op Assessment: Report given to RN and Post -op Vital signs reviewed and stable  Post vital signs: Reviewed and stable  Last Vitals:  Vitals:   08/14/16 0723 08/14/16 0945  BP: 120/68 117/67  Pulse: 60 64  Resp: 19 18  Temp: 36.9 C 36.8 C    Last Pain:  Vitals:   08/14/16 0945  TempSrc: Tympanic         Complications: No apparent anesthesia complications

## 2016-08-15 ENCOUNTER — Encounter: Payer: Self-pay | Admitting: Unknown Physician Specialty

## 2016-08-18 LAB — SURGICAL PATHOLOGY

## 2016-12-17 ENCOUNTER — Ambulatory Visit
Admission: RE | Admit: 2016-12-17 | Discharge: 2016-12-17 | Disposition: A | Discharge status: Home / Self Care | Payer: Medicare Other | Source: Ambulatory Visit | Attending: Hematology and Oncology | Admitting: Hematology and Oncology

## 2016-12-17 ENCOUNTER — Other Ambulatory Visit: Payer: Self-pay | Admitting: Hematology and Oncology

## 2016-12-17 DIAGNOSIS — Z9889 Other specified postprocedural states: Secondary | ICD-10-CM | POA: Insufficient documentation

## 2016-12-17 DIAGNOSIS — D0511 Intraductal carcinoma in situ of right breast: Secondary | ICD-10-CM | POA: Diagnosis present

## 2017-01-09 ENCOUNTER — Inpatient Hospital Stay (HOSPITAL_BASED_OUTPATIENT_CLINIC_OR_DEPARTMENT_OTHER): Payer: Medicare Other | Admitting: Hematology and Oncology

## 2017-01-09 ENCOUNTER — Inpatient Hospital Stay: Payer: Medicare Other | Attending: Hematology and Oncology

## 2017-01-09 ENCOUNTER — Encounter: Payer: Self-pay | Admitting: Hematology and Oncology

## 2017-01-09 VITALS — BP 139/84 | HR 73 | Temp 97.2°F | Resp 18 | Wt 240.5 lb

## 2017-01-09 DIAGNOSIS — Z880 Allergy status to penicillin: Secondary | ICD-10-CM

## 2017-01-09 DIAGNOSIS — E063 Autoimmune thyroiditis: Secondary | ICD-10-CM | POA: Insufficient documentation

## 2017-01-09 DIAGNOSIS — I10 Essential (primary) hypertension: Secondary | ICD-10-CM | POA: Diagnosis not present

## 2017-01-09 DIAGNOSIS — K449 Diaphragmatic hernia without obstruction or gangrene: Secondary | ICD-10-CM

## 2017-01-09 DIAGNOSIS — Z86000 Personal history of in-situ neoplasm of breast: Secondary | ICD-10-CM | POA: Insufficient documentation

## 2017-01-09 DIAGNOSIS — Z794 Long term (current) use of insulin: Secondary | ICD-10-CM | POA: Diagnosis not present

## 2017-01-09 DIAGNOSIS — Z171 Estrogen receptor negative status [ER-]: Secondary | ICD-10-CM | POA: Insufficient documentation

## 2017-01-09 DIAGNOSIS — N6019 Diffuse cystic mastopathy of unspecified breast: Secondary | ICD-10-CM

## 2017-01-09 DIAGNOSIS — Z9981 Dependence on supplemental oxygen: Secondary | ICD-10-CM

## 2017-01-09 DIAGNOSIS — E119 Type 2 diabetes mellitus without complications: Secondary | ICD-10-CM | POA: Diagnosis not present

## 2017-01-09 DIAGNOSIS — E669 Obesity, unspecified: Secondary | ICD-10-CM | POA: Insufficient documentation

## 2017-01-09 DIAGNOSIS — K219 Gastro-esophageal reflux disease without esophagitis: Secondary | ICD-10-CM | POA: Diagnosis not present

## 2017-01-09 DIAGNOSIS — E7800 Pure hypercholesterolemia, unspecified: Secondary | ICD-10-CM | POA: Diagnosis not present

## 2017-01-09 DIAGNOSIS — Z923 Personal history of irradiation: Secondary | ICD-10-CM

## 2017-01-09 DIAGNOSIS — D509 Iron deficiency anemia, unspecified: Secondary | ICD-10-CM

## 2017-01-09 DIAGNOSIS — R5383 Other fatigue: Secondary | ICD-10-CM | POA: Diagnosis not present

## 2017-01-09 DIAGNOSIS — Z79899 Other long term (current) drug therapy: Secondary | ICD-10-CM

## 2017-01-09 DIAGNOSIS — K317 Polyp of stomach and duodenum: Secondary | ICD-10-CM | POA: Diagnosis not present

## 2017-01-09 DIAGNOSIS — D0511 Intraductal carcinoma in situ of right breast: Secondary | ICD-10-CM

## 2017-01-09 LAB — COMPREHENSIVE METABOLIC PANEL
ALT: 17 U/L (ref 14–54)
AST: 29 U/L (ref 15–41)
Albumin: 3.8 g/dL (ref 3.5–5.0)
Alkaline Phosphatase: 67 U/L (ref 38–126)
Anion gap: 7 (ref 5–15)
BUN: 13 mg/dL (ref 6–20)
CO2: 26 mmol/L (ref 22–32)
Calcium: 9.2 mg/dL (ref 8.9–10.3)
Chloride: 97 mmol/L — ABNORMAL LOW (ref 101–111)
Creatinine, Ser: 0.62 mg/dL (ref 0.44–1.00)
GFR calc Af Amer: >60 mL/min (ref >60)
GFR calc non Af Amer: >60 mL/min (ref >60)
Glucose, Bld: 199 mg/dL — ABNORMAL HIGH (ref 65–99)
Potassium: 4.2 mmol/L (ref 3.5–5.1)
Sodium: 130 mmol/L — ABNORMAL LOW (ref 135–145)
Total Bilirubin: 0.7 mg/dL (ref 0.3–1.2)
Total Protein: 8.2 g/dL — ABNORMAL HIGH (ref 6.5–8.1)

## 2017-01-09 LAB — CBC WITH DIFFERENTIAL/PLATELET
Basophils Absolute: 0 10*3/uL (ref 0–0.1)
Basophils Relative: 1 %
Eosinophils Absolute: 0.2 10*3/uL (ref 0–0.7)
Eosinophils Relative: 2 %
HCT: 34.4 % — ABNORMAL LOW (ref 35.0–47.0)
Hemoglobin: 11.8 g/dL — ABNORMAL LOW (ref 12.0–16.0)
Lymphocytes Relative: 24 %
Lymphs Abs: 1.7 10*3/uL (ref 1.0–3.6)
MCH: 28.6 pg (ref 26.0–34.0)
MCHC: 34.3 g/dL (ref 32.0–36.0)
MCV: 83.4 fL (ref 80.0–100.0)
Monocytes Absolute: 0.4 10*3/uL (ref 0.2–0.9)
Monocytes Relative: 6 %
Neutro Abs: 4.9 10*3/uL (ref 1.4–6.5)
Neutrophils Relative %: 67 %
Platelets: 336 10*3/uL (ref 150–440)
RBC: 4.13 MIL/uL (ref 3.80–5.20)
RDW: 12.1 % (ref 11.5–14.5)
WBC: 7.2 10*3/uL (ref 3.6–11.0)

## 2017-01-09 NOTE — Progress Notes (Signed)
Patient states she is a "little wobbly headed" today.  She states she started Lipitor about 8 weeks ago and has had muscle weakness and "not felt right in her head" since starting the medication.

## 2017-01-09 NOTE — Progress Notes (Signed)
[facility] day:   01/09/17   Chief Complaint: Hailey Hawkins is a 71 y.o. female with right breast ductal carcinoma in situ who is seen for 6 month assessment.  HPI:  The patient was last seen in the medical oncology clinic on 07/04/2016.  At that time, was doing well.  Exam revealed chronic tender fibrocystic changes.  Sodium was 129.  Bilateral mammogram on 12/17/2016 revealed a stable right breast lumpectomy site.  There was no evidence of malignancy.  She underwent EGD and colonoscopy on 08/14/2016 by Dr. Gaylyn Cheers.  EGD revealed a small hiatal hernia and a discolored mucosa in the gastric body.  Esophagus biopsy revealed reflux without dysplasia or malignancy.  Stomach polyp was hyperplastic and negative for H pylori, dysplasia or malignancy.  Stomach biopsy revealed iron pill gastritis.  Colonoscopy revealed 1 diminutive polyp in the descending colon, removed with forceps.  Pathology revealed a tubular adenoma which was negative for dysplasia or malignancy.  Symptomatically, she feels "tired, tired, tired'.  She is on oxygen at night.  She tried to do a sleep test.     Past Medical History:  Diagnosis Date  . Anemia    pernicious  . Asthma   . Back pain   . Breast cancer ([place]) 2014   right c lumpectomy c radiation  . Breast injury Few Weeks ago   Patient fell a few weeks ago -- Rt breast may be tender when compressed  . DCIS (ductal carcinoma in situ) of breast   . Diabetes mellitus without complication ([place])   . Elevated cholesterol   . GERD (gastroesophageal reflux disease)   . Hashimoto's disease   . Hypertension   . Hypothyroidism   . MRSA infection   . Obesity goither  . SIADH (syndrome of inappropriate ADH production) ([place])   . SOB (shortness of breath)   . Tachycardia     Past Surgical History:  Procedure Laterality Date  . ABDOMINAL HYSTERECTOMY    . BREAST BIOPSY Right 2014  . CESAREAN SECTION     x2  .  colononoscopy    . COLONOSCOPY N/A 08/14/2016   Procedure: COLONOSCOPY;  Surgeon: Manya Silvas, MD;  Location: [facility] ENDOSCOPY;  Service: Endoscopy;  Laterality: N/A;  . COLONOSCOPY WITH ESOPHAGOGASTRODUODENOSCOPY (EGD)    . ESOPHAGOGASTRODUODENOSCOPY (EGD) WITH PROPOFOL N/A 08/14/2016   Procedure: ESOPHAGOGASTRODUODENOSCOPY (EGD) WITH PROPOFOL;  Surgeon: Manya Silvas, MD;  Location[facility] ENDOSCOPY;  Service: Endoscopy;  Laterality: N/A;  . MASTECTOMY    . MASTECTOMY, PARTIAL Right   . REPLACEMENT TOTAL KNEE Right   . REPLACEMENT TOTAL KNEE Left   . THYROIDECTOMY, PARTIAL     caused throat paralysis  right side  . TONSILLECTOMY      Family History  Problem Relation Age of Onset  . Cancer Daughter     colon cancer; age 72  . Breast cancer Neg Hx     Social History:  reports that she has never smoked. She has never used smokeless tobacco. She reports that she does not drink alcohol or use drugs.  The patient lives in [place].  The patient is alone today.  Allergies:  Allergies  Allergen Reactions  . Methamphetamine Shortness Of Breath    Difficulty breathing (INHALER)  . Amoxicillin     Other reaction(s): Diarrhea and vomiting (finding)  . Antihistamines, Chlorpheniramine-Type     Other reaction(s): Other (See Comments) Severe dryness  . Ciprofloxacin  Other reaction(s): Distress (finding)  . Codeine     Other reaction(s): Diarrhea and vomiting (finding), Weal or any derivitive  . Other Other (See Comments)    Birth control pills - blood clots Birth control pills - blood clots  . Penicillin G     Other reaction(s): Weal  . Meloxicam Palpitations    Current Medications: Current Outpatient Prescriptions  Medication Sig Dispense Refill  . acetaminophen (TYLENOL) 500 MG tablet Take 1,000 mg by mouth every 6 (six) hours as needed for mild pain.    Marland Kitchen albuterol (PROVENTIL HFA;VENTOLIN HFA) 108 (90 BASE) MCG/ACT inhaler Inhale 2 puffs into the lungs every 4 (four)  hours as needed for wheezing or shortness of breath.    . ALPRAZolam (XANAX) 0.5 MG tablet Take 0.5 mg by mouth daily.    Marland Kitchen atorvastatin (LIPITOR) 10 MG tablet Take by mouth.    . cyanocobalamin 1000 MCG tablet Inject 1,000 mcg into the muscle every 30 (thirty) days.    . diphenhydramine-acetaminophen (TYLENOL PM) 25-500 MG TABS Take 2 tablets by mouth at bedtime as needed.    . docusate sodium (COLACE) 100 MG capsule Take 100 mg by mouth 2 (two) times daily.    Marland Kitchen doxycycline (VIBRA-TABS) 100 MG tablet Take 100 mg by mouth daily as needed.    . ferrous sulfate 325 (65 FE) MG tablet Take 325 mg by mouth 2 (two) times daily with a meal.    . Fluticasone-Salmeterol (ADVAIR) 250-50 MCG/DOSE AEPB Inhale 1 puff into the lungs 2 (two) times daily.    . hydrALAZINE (APRESOLINE) 100 MG tablet     . insulin glargine (LANTUS) 100 UNIT/ML injection Inject 30 Units into the skin at bedtime.    Marland Kitchen levothyroxine (SYNTHROID, LEVOTHROID) 100 MCG tablet Take 100 mcg by mouth daily before breakfast.    . losartan (COZAAR) 50 MG tablet Take 50 mg by mouth 2 (two) times daily.    . magnesium oxide (MAG-OX) 400 MG tablet     . metFORMIN (GLUCOPHAGE) 1000 MG tablet Take 1,000 mg by mouth 2 (two) times daily with a meal.    . metoprolol succinate (TOPROL-XL) 50 MG 24 hr tablet Take 50 mg by mouth daily. Take with or immediately following a meal.    . omeprazole (PRILOSEC OTC) 20 MG tablet Take 20 mg by mouth 2 (two) times daily.    . OXYGEN Inhale 2 L into the lungs at bedtime.    . sucralfate (CARAFATE) 1 G tablet Take 1 g by mouth 4 (four) times daily.     Marland Kitchen dextromethorphan-guaiFENesin (MUCINEX DM) 30-600 MG per 12 hr tablet Take 1 tablet by mouth 2 (two) times daily as needed for cough.    . escitalopram (LEXAPRO) 10 MG tablet Take by mouth.    . montelukast (SINGULAIR) 10 MG tablet Take 10 mg by mouth at bedtime.    Marland Kitchen nystatin (MYCOSTATIN/NYSTOP) 100000 UNIT/GM POWD Apply topically to rash underneath breasts twice  a day. (Patient not taking: Reported on 01/09/2017) 15 g 1   No current facility-administered medications for this visit.     Review of Systems:  GENERAL:  Feels "tired, tired, tired".  No fevers or sweats.  Weight down 7 pounds. PERFORMANCE STATUS (ECOG):  1 HEENT:  No visual changes, runny nose, sore throat, mouth sores or tenderness. Lungs: No shortness of breath or cough.  No hemoptysis.  Oxygen at night. Cardiac:  No chest pain, palpitations, orthopnea, or PND. Breasts:  Tender breasts (right > left).  No nipple discharge.  GI:  History of colon polyps.  No nausea, vomiting, diarrhea, constipation, melena or hematochezia.  EGD and colonoscopy in 07/2016. GU:  No urgency, frequency, dysuria, or hematuria. Musculoskeletal:  No back pain.  No joint pain.  No muscle tenderness. Extremities:  No pain or swelling. Skin:  No rashes or skin changes. Neuro:  No headache, numbness or weakness, balance or coordination issues. Endocrine:  Diabetes. Thyroid issues on Synthroid.  No hot flashes or night sweats. Psych:  No mood changes, depression or anxiety. Pain:  No focal pain. Review of systems:  All other systems reviewed and found to be negative.   Physical Exam: Blood pressure 139/84, pulse 73, temperature 97.2 F (36.2 C), temperature source Tympanic, resp. rate 18, weight 240 lb 8.4 oz (109.1 kg). GENERAL:  Well developed, well nourished, heavyset woman sitting comfortably in the exam room in no acute distress. MENTAL STATUS:  Alert and oriented to person, place and time. HEAD:  Short gray hair curled under  Normocephalic, atraumatic, face symmetric, no Cushingoid features. EYES:  Blue eyes.  Pupils equal round and reactive to light and accomodation.  No conjunctivitis or scleral icterus. ENT:  Oropharynx clear without lesion.  Tongue normal. Mucous membranes moist.  RESPIRATORY:  Clear to auscultation without rales, wheezes or rhonchi. CARDIOVASCULAR:  Regular rate and rhythm without  murmur, rub or gallop. BREAST:  Right breast with well healed medial periareolar incision. Tender inferior quadrant post-operative and post-radiation changes.  No discrete masses, skin changes or nipple discharge.  Left breast with moderate tender fibrocystic changes.  No masses, skin changes or nipple discharge.   ABDOMEN:  Soft, non-tender, with active bowel sounds, and no appreciable hepatosplenomegaly.  No masses. SKIN:  No ulcers or lesions. EXTREMITIES: No edema, no skin discoloration or tenderness.  No palpable cords. LYMPH NODES: No palpable cervical, supraclavicular, axillary or inguinal adenopathy  NEUROLOGICAL: Unremarkable. PSYCH:  Appropriate.    Appointment on 01/09/2017  Component Date Value Ref Range Status  . WBC 01/09/2017 7.2  3.6 - 11.0 K/uL Final  . RBC 01/09/2017 4.13  3.80 - 5.20 MIL/uL Final  . Hemoglobin 01/09/2017 11.8* 12.0 - 16.0 g/dL Final  . HCT 01/09/2017 34.4* 35.0 - 47.0 % Final  . MCV 01/09/2017 83.4  80.0 - 100.0 fL Final  . MCH 01/09/2017 28.6  26.0 - 34.0 pg Final  . MCHC 01/09/2017 34.3  32.0 - 36.0 g/dL Final  . RDW 01/09/2017 12.1  11.5 - 14.5 % Final  . Platelets 01/09/2017 336  150 - 440 K/uL Final  . Neutrophils Relative % 01/09/2017 67  % Final  . Neutro Abs 01/09/2017 4.9  1.4 - 6.5 K/uL Final  . Lymphocytes Relative 01/09/2017 24  % Final  . Lymphs Abs 01/09/2017 1.7  1.0 - 3.6 K/uL Final  . Monocytes Relative 01/09/2017 6  % Final  . Monocytes Absolute 01/09/2017 0.4  0.2 - 0.9 K/uL Final  . Eosinophils Relative 01/09/2017 2  % Final  . Eosinophils Absolute 01/09/2017 0.2  0 - 0.7 K/uL Final  . Basophils Relative 01/09/2017 1  % Final  . Basophils Absolute 01/09/2017 0.0  0 - 0.1 K/uL Final  . Sodium 01/09/2017 130* 135 - 145 mmol/L Final  . Potassium 01/09/2017 4.2  3.5 - 5.1 mmol/L Final  . Chloride 01/09/2017 97* 101 - 111 mmol/L Final  . CO2 01/09/2017 26  22 - 32 mmol/L Final  . Glucose, Bld 01/09/2017 199* 65 - 99 mg/dL Final  .  BUN 01/09/2017 13  6 - 20 mg/dL Final  . Creatinine, Ser 01/09/2017 0.62  0.44 - 1.00 mg/dL Final  . Calcium 01/09/2017 9.2  8.9 - 10.3 mg/dL Final  . Total Protein 01/09/2017 8.2* 6.5 - 8.1 g/dL Final  . Albumin 01/09/2017 3.8  3.5 - 5.0 g/dL Final  . AST 01/09/2017 29  15 - 41 U/L Final  . ALT 01/09/2017 17  14 - 54 U/L Final  . Alkaline Phosphatase 01/09/2017 67  38 - 126 U/L Final  . Total Bilirubin 01/09/2017 0.7  0.3 - 1.2 mg/dL Final  . GFR calc non Af Amer 01/09/2017 >60  >60 mL/min Final  . GFR calc Af Amer 01/09/2017 >60  >60 mL/min Final   Comment: (NOTE) The eGFR has been calculated using the CKD EPI equation. This calculation has not been validated in all clinical situations. eGFR's persistently <60 mL/min signify possible Chronic Kidney Disease.   . Anion gap 01/09/2017 7  5 - 15 Final    Assessment:  Hailey Hawkins is a 71 y.o. 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  Pathology revealed a 1.4 cm focus of grade 3 comedo type DCIS with microcalcifications and desmoplastic stromal response.  There was no evidence of invasive carcinoma. Margins were clear.  Tumor was ER was negative (< 1%) and PR was negative (0%).  She completed radiation to her right breast on 04/04/2014.   Mammogram on 12/17/2015 revealed a new group of 6 mm calcifications of the right breast which appeared around a central lucency suggestive of fat necrosis.  Right-sided mammogram on 06/17/2016 revealed calcifications within the medial right breasts benign consistent with evolving fat necrosis. There were no findings worrisome for malignancy.  Bilateral mammogram on 12/17/2016 revealed a stable right breast lumpectomy site.  There was no evidence of malignancy.  She has a history of iron deficiency.  She is on oral iron.  EGD on 08/14/2016 revealed a small hiatal hernia and a discolored mucosa in the gastric body (iron pill gastritis).  Esophagus biopsy revealed reflux without  dysplasia or malignancy.  Stomach polyp was hyperplastic and negative for H pylori, dysplasia or malignancy.  Colonoscopy revealed 1 diminutive polyp in the descending colon, removed with forceps.  Pathology revealed a tubular adenoma which was negative for dysplasia or malignancy.  She has SIADH.  Sodium ranges between 126-134.  Symptomatically, she feels fatigued.  Exam reveals chronic tender fibrocystic changes.  Sodium is 130.  Plan: 1.  Labs today:  CBC with diff, CMP. 2.  Review 12/17/2016 mammogram.  Discuss plan for follow-up diagnostic mammogram in 1 year. 3.  Discuss EGD and colonoscopy.  No evidence of malignancy.  Benign polyp removed from esophagus. 4.  RTC in 6 months for MD assessment and labs (CBC with diff, CMP).   Lequita Asal, MD  01/09/2017, 2:45 PM

## 2017-05-07 IMAGING — MG MM DIAG BREAST TOMO BILATERAL
8 of 14 series · 8 of 30 positions shown · non-contrast
Comparison: Previous exam(s).

CLINICAL DATA: Patient with history of right breast lumpectomy.

EXAM:
DIGITAL DIAGNOSTIC BILATERAL MAMMOGRAM WITH 3D TOMOSYNTHESIS AND CAD

[R ML]
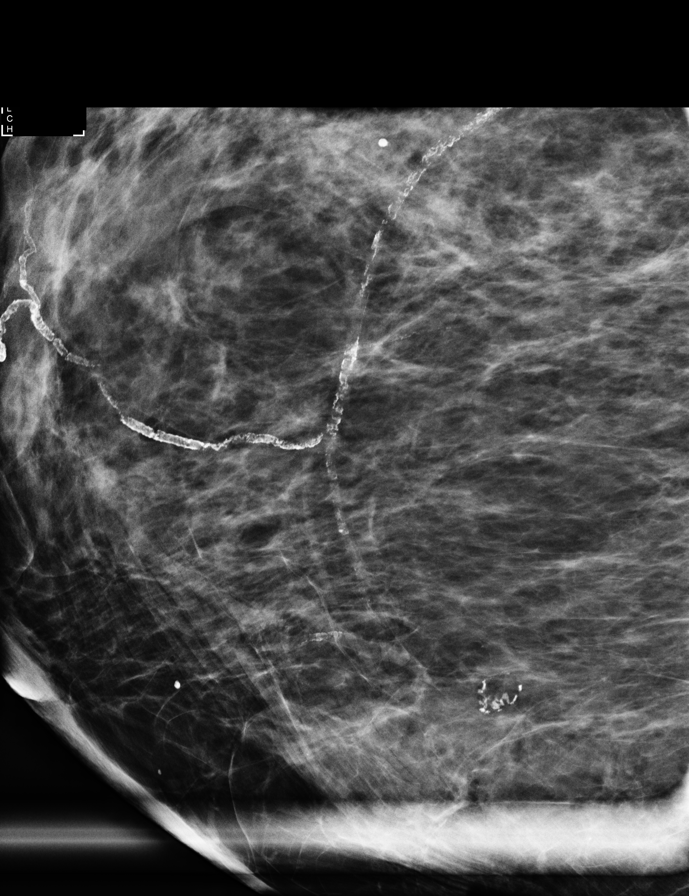

[R CC]
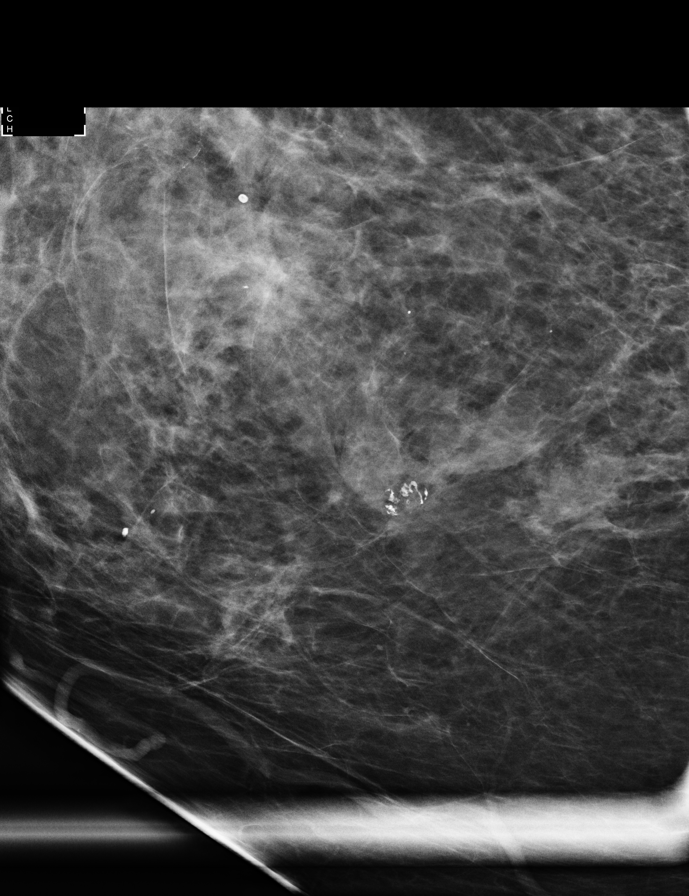

[L MLO synth-2D]
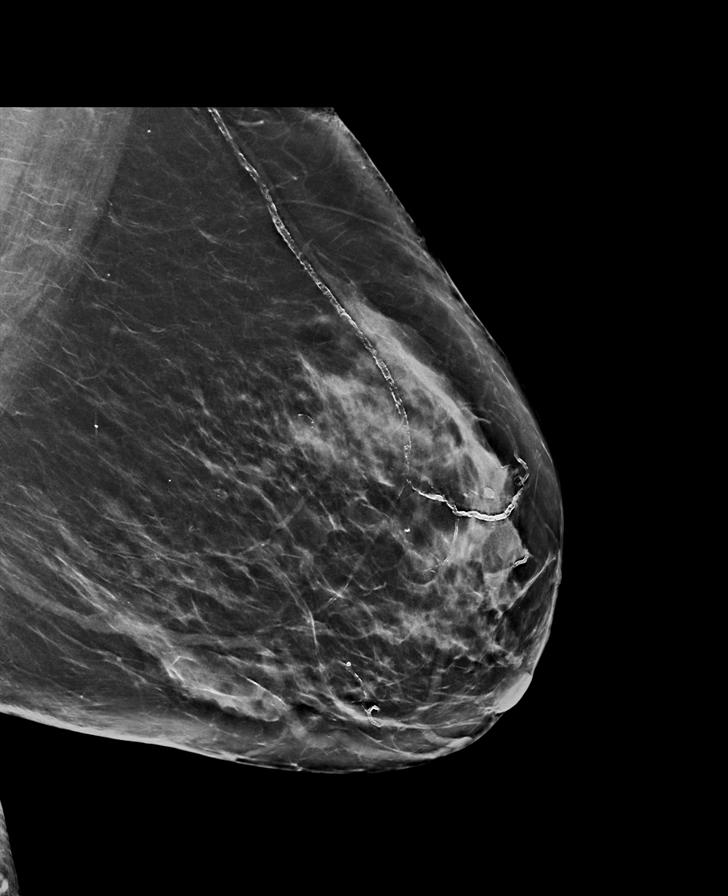

[R MLO]
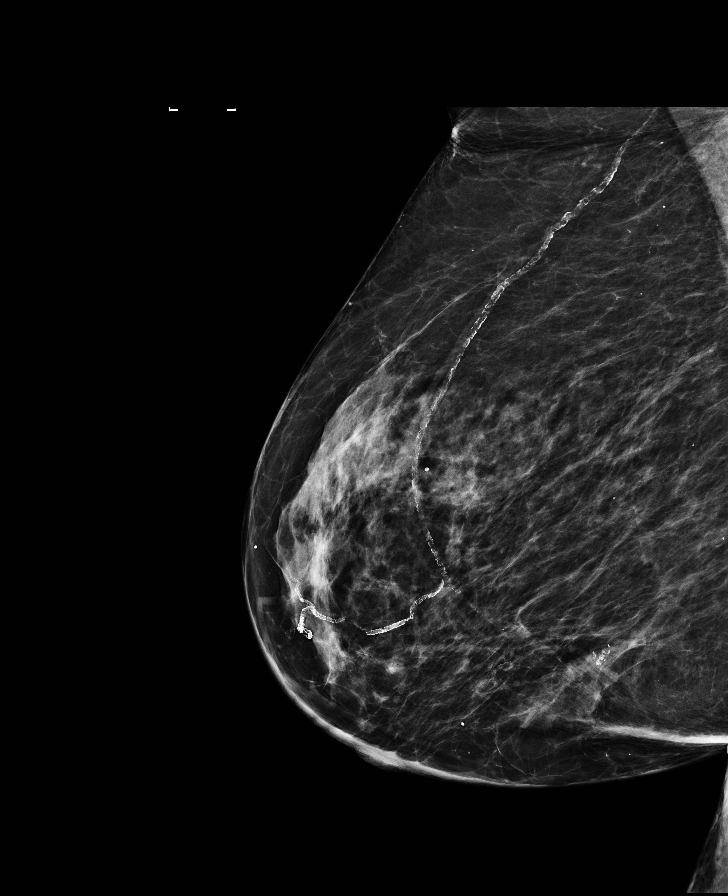

[L CC synth-2D]
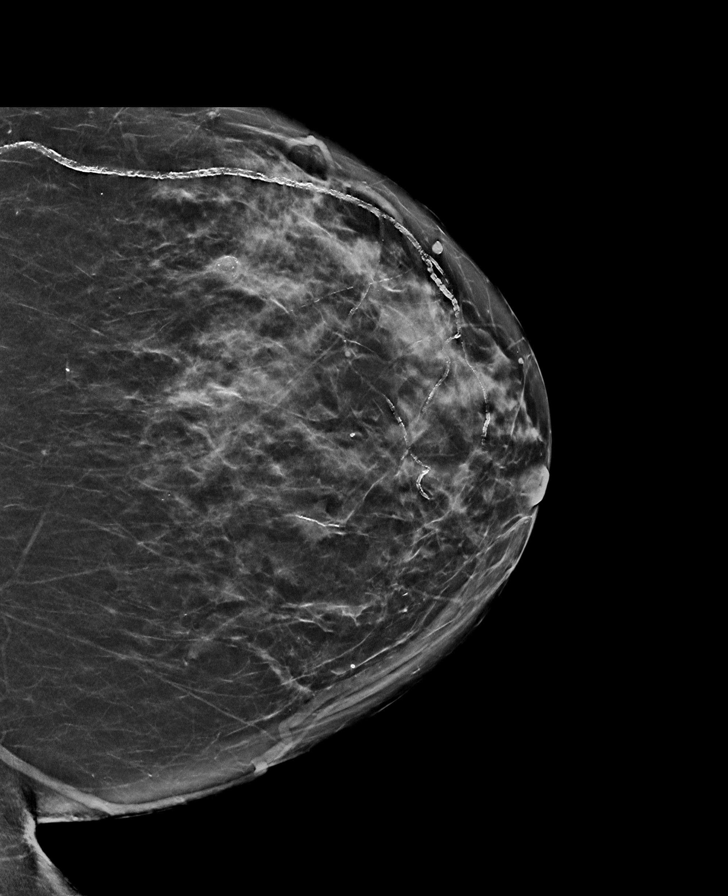

[L CC]
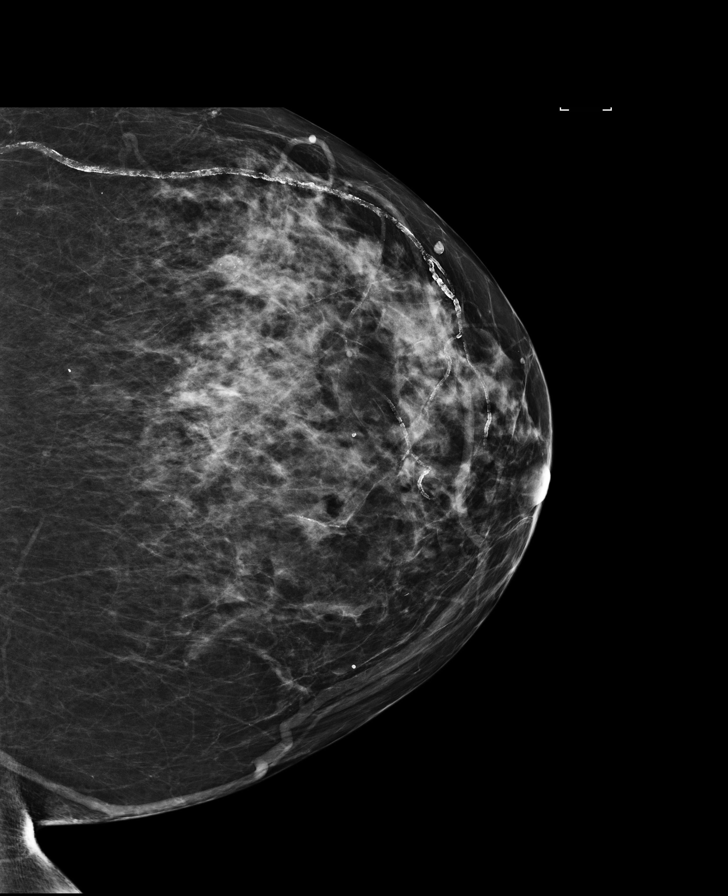

[R MLO synth-2D]
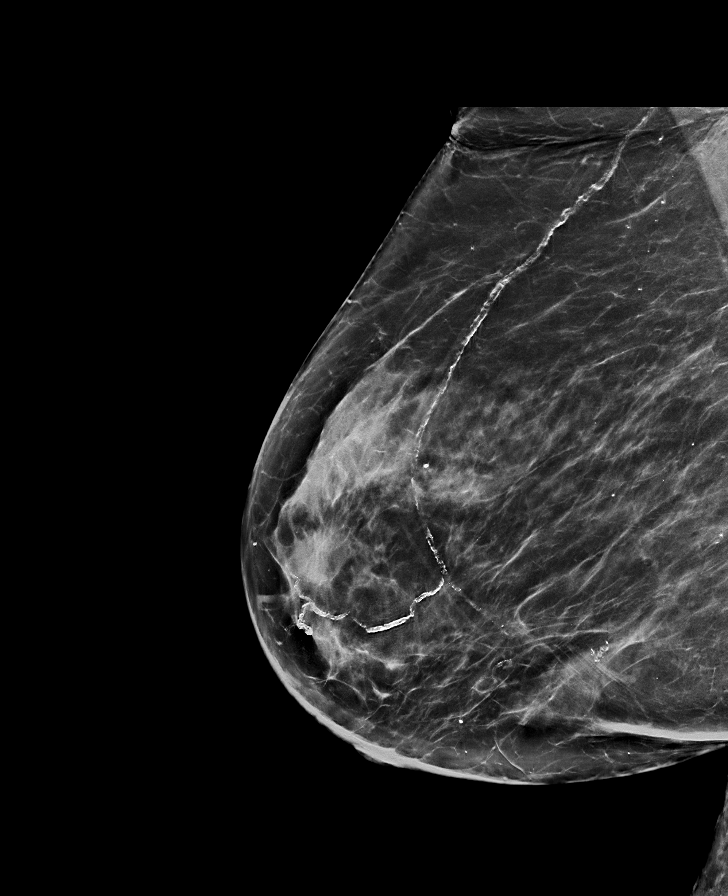

[R CC synth-2D]
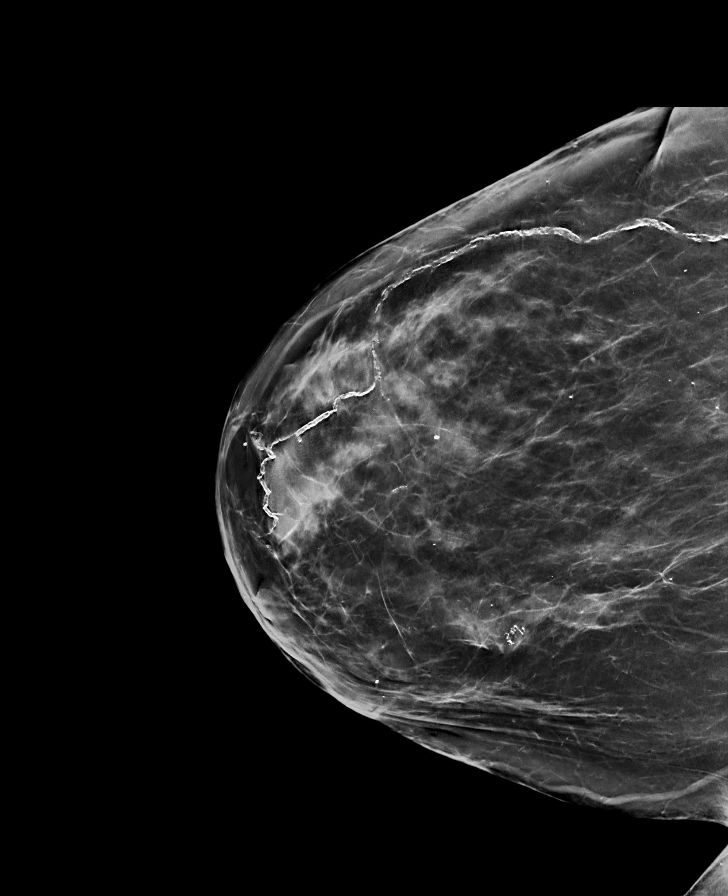

[8 of 30 positions shown; findings below may reference images not displayed]

ACR Breast Density Category c: The breast tissue is heterogeneously
dense, which may obscure small masses.
FINDINGS: Postlumpectomy changes within the medial aspect of the right breast.
There is a new 6 mm group of calcifications which appear to be
around a central lucency, suggestive of fat necrosis. Otherwise
stable postlumpectomy changes in the right breast. No additional
concerning masses, calcifications or architectural distortion
identified within either breast.

Mammographic images were processed with CAD.
IMPRESSION: Calcifications at the right breast lumpectomy site are favored to
represent fat necrosis.

RECOMMENDATION:
Right breast diagnostic mammography in 6 months to demonstrate
stability of probably benign findings.

I have discussed the findings and recommendations with the patient.
Results were also provided in writing at the conclusion of the
visit. If applicable, a reminder letter will be sent to the patient
regarding the next appointment.

BI-RADS CATEGORY  3: Probably benign.

## 2017-06-25 ENCOUNTER — Encounter: Payer: Self-pay | Admitting: Radiation Oncology

## 2017-06-25 ENCOUNTER — Ambulatory Visit
Admission: RE | Admit: 2017-06-25 | Discharge: 2017-06-25 | Disposition: A | Discharge status: Home / Self Care | Payer: Medicare Other | Source: Ambulatory Visit | Attending: Radiation Oncology | Admitting: Radiation Oncology

## 2017-06-25 VITALS — BP 130/79 | HR 65 | Temp 98.8°F | Resp 20 | Wt 233.4 lb

## 2017-06-25 DIAGNOSIS — Z923 Personal history of irradiation: Secondary | ICD-10-CM | POA: Diagnosis not present

## 2017-06-25 DIAGNOSIS — Z86000 Personal history of in-situ neoplasm of breast: Secondary | ICD-10-CM | POA: Insufficient documentation

## 2017-06-25 DIAGNOSIS — D0511 Intraductal carcinoma in situ of right breast: Secondary | ICD-10-CM

## 2017-06-25 NOTE — Progress Notes (Signed)
Radiation Oncology Follow up Note  Name: Hailey Hawkins   Date:   06/25/2017 MRN:  [id number] DOB: 1946/04/20    This 71 y.o. 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  REFERRING PROVIDER: Idelle Crouch, MD  HPI: Patient is a 71 year old female now out 3 years having completed whole breast radiation to her right breast for ER/PR negative ductal carcinoma in situ seen today in routine follow-up she is doing well. She specifically denies breast tenderness cough or bone pain. She is not on antiestrogen therapy based on the ER/PR negative nature of her disease.Marland Kitchen Her last mammogram was back in December 2017 BI-RADS 2 benign.  COMPLICATIONS OF TREATMENT: none  FOLLOW UP COMPLIANCE: keeps appointments   PHYSICAL EXAM:  BP 130/79   Pulse 65   Temp 98.8 F (37.1 C)   Resp 20   Wt 233 lb 5.7 oz (105.8 kg)   BMI 38.83 kg/m  Lungs are clear to A&P cardiac examination essentially unremarkable with regular rate and rhythm. No dominant mass or nodularity is noted in either breast in 2 positions examined. Incision is well-healed. No axillary or supraclavicular adenopathy is appreciated. Cosmetic result is excellent. Macro 1  RADIOLOGY RESULTS: Mammograms reviewed and compatible with the above-stated findings  PLAN: Present time she continues to do well 3 years out with no evidence of disease. I'm please were overall progress. She is a rescheduled for follow-up mammograms in December. She is not on antiestrogen therapy based on her ER/PR status. I have asked to see her back in 1 year for follow-up. She knows to call sooner with any concerns.  I would like to take this opportunity to thank you for allowing me to participate in the care of your patient.Armstead Peaks., MD

## 2017-07-13 ENCOUNTER — Inpatient Hospital Stay (HOSPITAL_BASED_OUTPATIENT_CLINIC_OR_DEPARTMENT_OTHER): Payer: Medicare Other | Admitting: Hematology and Oncology

## 2017-07-13 ENCOUNTER — Inpatient Hospital Stay: Payer: Medicare Other | Attending: Hematology and Oncology

## 2017-07-13 ENCOUNTER — Encounter: Payer: Self-pay | Admitting: Hematology and Oncology

## 2017-07-13 ENCOUNTER — Telehealth: Payer: Self-pay | Admitting: *Deleted

## 2017-07-13 VITALS — BP 136/81 | HR 71 | Temp 97.7°F | Resp 18 | Wt 236.1 lb

## 2017-07-13 DIAGNOSIS — E669 Obesity, unspecified: Secondary | ICD-10-CM | POA: Diagnosis not present

## 2017-07-13 DIAGNOSIS — N6019 Diffuse cystic mastopathy of unspecified breast: Secondary | ICD-10-CM | POA: Diagnosis not present

## 2017-07-13 DIAGNOSIS — I10 Essential (primary) hypertension: Secondary | ICD-10-CM | POA: Insufficient documentation

## 2017-07-13 DIAGNOSIS — Z79899 Other long term (current) drug therapy: Secondary | ICD-10-CM | POA: Insufficient documentation

## 2017-07-13 DIAGNOSIS — R5383 Other fatigue: Secondary | ICD-10-CM | POA: Diagnosis not present

## 2017-07-13 DIAGNOSIS — Z9981 Dependence on supplemental oxygen: Secondary | ICD-10-CM | POA: Diagnosis not present

## 2017-07-13 DIAGNOSIS — E063 Autoimmune thyroiditis: Secondary | ICD-10-CM | POA: Insufficient documentation

## 2017-07-13 DIAGNOSIS — Z86000 Personal history of in-situ neoplasm of breast: Secondary | ICD-10-CM

## 2017-07-13 DIAGNOSIS — Z171 Estrogen receptor negative status [ER-]: Secondary | ICD-10-CM

## 2017-07-13 DIAGNOSIS — K219 Gastro-esophageal reflux disease without esophagitis: Secondary | ICD-10-CM | POA: Diagnosis not present

## 2017-07-13 DIAGNOSIS — Z880 Allergy status to penicillin: Secondary | ICD-10-CM | POA: Insufficient documentation

## 2017-07-13 DIAGNOSIS — Z794 Long term (current) use of insulin: Secondary | ICD-10-CM | POA: Diagnosis not present

## 2017-07-13 DIAGNOSIS — Z86010 Personal history of colonic polyps: Secondary | ICD-10-CM | POA: Insufficient documentation

## 2017-07-13 DIAGNOSIS — Z923 Personal history of irradiation: Secondary | ICD-10-CM

## 2017-07-13 DIAGNOSIS — E7800 Pure hypercholesterolemia, unspecified: Secondary | ICD-10-CM

## 2017-07-13 DIAGNOSIS — E119 Type 2 diabetes mellitus without complications: Secondary | ICD-10-CM

## 2017-07-13 DIAGNOSIS — D0511 Intraductal carcinoma in situ of right breast: Secondary | ICD-10-CM

## 2017-07-13 LAB — CBC WITH DIFFERENTIAL/PLATELET
Basophils Absolute: 0 10*3/uL (ref 0–0.1)
Basophils Relative: 1 %
Eosinophils Absolute: 0.2 10*3/uL (ref 0–0.7)
Eosinophils Relative: 3 %
HCT: 33.7 % — ABNORMAL LOW (ref 35.0–47.0)
Hemoglobin: 11.7 g/dL — ABNORMAL LOW (ref 12.0–16.0)
Lymphocytes Relative: 21 %
Lymphs Abs: 1.6 10*3/uL (ref 1.0–3.6)
MCH: 28.8 pg (ref 26.0–34.0)
MCHC: 34.7 g/dL (ref 32.0–36.0)
MCV: 83.1 fL (ref 80.0–100.0)
Monocytes Absolute: 0.4 10*3/uL (ref 0.2–0.9)
Monocytes Relative: 5 %
Neutro Abs: 5.4 10*3/uL (ref 1.4–6.5)
Neutrophils Relative %: 70 %
Platelets: 337 10*3/uL (ref 150–440)
RBC: 4.05 MIL/uL (ref 3.80–5.20)
RDW: 12.3 % (ref 11.5–14.5)
WBC: 7.6 10*3/uL (ref 3.6–11.0)

## 2017-07-13 LAB — COMPREHENSIVE METABOLIC PANEL
ALT: 11 U/L — ABNORMAL LOW (ref 14–54)
AST: 19 U/L (ref 15–41)
Albumin: 3.4 g/dL — ABNORMAL LOW (ref 3.5–5.0)
Alkaline Phosphatase: 55 U/L (ref 38–126)
Anion gap: 6 (ref 5–15)
BUN: 10 mg/dL (ref 6–20)
CO2: 25 mmol/L (ref 22–32)
Calcium: 9 mg/dL (ref 8.9–10.3)
Chloride: 97 mmol/L — ABNORMAL LOW (ref 101–111)
Creatinine, Ser: 0.62 mg/dL (ref 0.44–1.00)
GFR calc Af Amer: >60 mL/min (ref >60)
GFR calc non Af Amer: >60 mL/min (ref >60)
Glucose, Bld: 276 mg/dL — ABNORMAL HIGH (ref 65–99)
Potassium: 4.5 mmol/L (ref 3.5–5.1)
Sodium: 128 mmol/L — ABNORMAL LOW (ref 135–145)
Total Bilirubin: 0.6 mg/dL (ref 0.3–1.2)
Total Protein: 7.1 g/dL (ref 6.5–8.1)

## 2017-07-13 NOTE — Telephone Encounter (Signed)
Called patient to inform her that her sodium is a little low.  Informed her that her labs have been sent to her PCP.

## 2017-07-13 NOTE — Progress Notes (Signed)
Patient states she is fatigued.  Otherwise, no complaints.

## 2017-07-13 NOTE — Progress Notes (Signed)
[facility] day:   07/13/17   Chief Complaint: Hailey Hawkins is a 71 y.o. female with right breast ductal carcinoma in situ who is seen for 6 month assessment.  HPI:  The patient was last seen in the medical oncology clinic on 01/09/2017.  At that time, she felt tired.   Exam revealed chronic tender fibrocystic changes.  Sodium was 130.  During the interim, she has felt pretty good. She notes only being a little tired.  She denies any breast concerns.   Past Medical History:  Diagnosis Date  . Anemia    pernicious  . Asthma   . Back pain   . Breast cancer ([place]) 2014   right c lumpectomy c radiation  . Breast injury Few Weeks ago   Patient fell a few weeks ago -- Rt breast may be tender when compressed  . DCIS (ductal carcinoma in situ) of breast   . Diabetes mellitus without complication ([place])   . Elevated cholesterol   . GERD (gastroesophageal reflux disease)   . Hashimoto's disease   . Hypertension   . Hypothyroidism   . MRSA infection   . Obesity goither  . SIADH (syndrome of inappropriate ADH production) ([place])   . SOB (shortness of breath)   . Tachycardia     Past Surgical History:  Procedure Laterality Date  . ABDOMINAL HYSTERECTOMY    . BREAST BIOPSY Right 2014  . CESAREAN SECTION     x2  . colononoscopy    . COLONOSCOPY N/A 08/14/2016   Procedure: COLONOSCOPY;  Surgeon: Manya Silvas, MD;  Location: [facility] ENDOSCOPY;  Service: Endoscopy;  Laterality: N/A;  . COLONOSCOPY WITH ESOPHAGOGASTRODUODENOSCOPY (EGD)    . ESOPHAGOGASTRODUODENOSCOPY (EGD) WITH PROPOFOL N/A 08/14/2016   Procedure: ESOPHAGOGASTRODUODENOSCOPY (EGD) WITH PROPOFOL;  Surgeon: Manya Silvas, MD;  Location[facility] ENDOSCOPY;  Service: Endoscopy;  Laterality: N/A;  . MASTECTOMY    . MASTECTOMY, PARTIAL Right   . REPLACEMENT TOTAL KNEE Right   . REPLACEMENT TOTAL KNEE Left   . THYROIDECTOMY, PARTIAL     caused throat paralysis  right side  .  TONSILLECTOMY      Family History  Problem Relation Age of Onset  . Cancer Daughter        colon cancer; age 47  . Breast cancer Neg Hx     Social History:  reports that she has never smoked. She has never used smokeless tobacco. She reports that she does not drink alcohol or use drugs.  The patient lives in [place].  The patient is alone today.  Allergies:  Allergies  Allergen Reactions  . Methamphetamine Shortness Of Breath    Difficulty breathing (INHALER)  . Amoxicillin     Other reaction(s): Diarrhea and vomiting (finding)  . Antihistamines, Chlorpheniramine-Type     Other reaction(s): Other (See Comments) Severe dryness  . Ciprofloxacin     Other reaction(s): Distress (finding)  . Codeine     Other reaction(s): Diarrhea and vomiting (finding), Weal or any derivitive  . Other Other (See Comments)    Birth control pills - blood clots Birth control pills - blood clots  . Penicillin G     Other reaction(s): Weal  . Meloxicam Palpitations    Current Medications: Current Outpatient Prescriptions  Medication Sig Dispense Refill  . acetaminophen (TYLENOL) 500 MG tablet Take 1,000 mg by mouth every 6 (six) hours as needed for mild pain.    Marland Kitchen albuterol (PROVENTIL HFA;VENTOLIN HFA)  108 (90 BASE) MCG/ACT inhaler Inhale 2 puffs into the lungs every 4 (four) hours as needed for wheezing or shortness of breath.    . ALPRAZolam (XANAX) 0.5 MG tablet Take 0.5 mg by mouth daily.    Marland Kitchen amLODipine (NORVASC) 2.5 MG tablet Take 2.5 mg by mouth daily.    Marland Kitchen atorvastatin (LIPITOR) 10 MG tablet Take by mouth.    . cyanocobalamin 1000 MCG tablet Inject 1,000 mcg into the muscle every 30 (thirty) days.    . diphenhydramine-acetaminophen (TYLENOL PM) 25-500 MG TABS Take 2 tablets by mouth at bedtime as needed.    . docusate sodium (COLACE) 100 MG capsule Take 100 mg by mouth 2 (two) times daily.    . ferrous sulfate 325 (65 FE) MG tablet Take 325 mg by mouth 2 (two) times daily with a meal.     . Fluticasone-Salmeterol (ADVAIR) 250-50 MCG/DOSE AEPB Inhale 1 puff into the lungs 2 (two) times daily.    . hydrALAZINE (APRESOLINE) 100 MG tablet     . insulin glargine (LANTUS) 100 UNIT/ML injection Inject 30 Units into the skin at bedtime.    Marland Kitchen levothyroxine (SYNTHROID, LEVOTHROID) 100 MCG tablet Take 100 mcg by mouth daily before breakfast.    . losartan (COZAAR) 50 MG tablet Take 50 mg by mouth 2 (two) times daily.    . magnesium oxide (MAG-OX) 400 MG tablet     . metFORMIN (GLUCOPHAGE) 1000 MG tablet Take 1,000 mg by mouth 2 (two) times daily with a meal.    . metoprolol succinate (TOPROL-XL) 50 MG 24 hr tablet Take 50 mg by mouth daily. Take with or immediately following a meal.    . montelukast (SINGULAIR) 10 MG tablet Take 10 mg by mouth at bedtime.    Marland Kitchen omeprazole (PRILOSEC OTC) 20 MG tablet Take 20 mg by mouth 2 (two) times daily.    . OXYGEN Inhale 2 L into the lungs at bedtime.    . sucralfate (CARAFATE) 1 G tablet Take 1 g by mouth 4 (four) times daily.     Marland Kitchen dextromethorphan-guaiFENesin (MUCINEX DM) 30-600 MG per 12 hr tablet Take 1 tablet by mouth 2 (two) times daily as needed for cough.    . doxycycline (VIBRA-TABS) 100 MG tablet Take 100 mg by mouth daily as needed.    Marland Kitchen escitalopram (LEXAPRO) 10 MG tablet Take by mouth.    . nystatin (MYCOSTATIN/NYSTOP) 100000 UNIT/GM POWD Apply topically to rash underneath breasts twice a day. (Patient not taking: Reported on 07/13/2017) 15 g 1   No current facility-administered medications for this visit.     Review of Systems:  GENERAL:  Feels "little fatigued".  No fevers or sweats.  Weight down 4 pounds. PERFORMANCE STATUS (ECOG):  1 HEENT:  No visual changes, runny nose, sore throat, mouth sores or tenderness. Lungs: No shortness of breath or cough.  No hemoptysis.  Oxygen at night. Cardiac:  No chest pain, palpitations, orthopnea, or PND. Breasts:  Chronic tender breasts (right > left).  No nipple discharge.  GI:  History of  colon polyps.  No nausea, vomiting, diarrhea, constipation, melena or hematochezia.  EGD and colonoscopy in 07/2016. GU:  No urgency, frequency, dysuria, or hematuria. Musculoskeletal:  No back pain.  No joint pain.  No muscle tenderness. Extremities:  No pain or swelling. Skin:  No rashes or skin changes. Neuro:  No headache, numbness or weakness, balance or coordination issues. Endocrine:  Diabetes. Thyroid issues on Synthroid.  No hot flashes or night  sweats. Psych:  No mood changes, depression or anxiety. Pain:  No focal pain. Review of systems:  All other systems reviewed and found to be negative.   Physical Exam: Blood pressure 136/81, pulse 71, temperature 97.7 F (36.5 C), temperature source Tympanic, resp. rate 18, weight 236 lb 1 oz (107.1 kg). GENERAL:  Well developed, well nourished, heavyset woman sitting comfortably in the exam room in no acute distress. MENTAL STATUS:  Alert and oriented to person, place and time. HEAD:  Short gray hair.  Normocephalic, atraumatic, face symmetric, no Cushingoid features. EYES:  Blue eyes.  Pupils equal round and reactive to light and accomodation.  No conjunctivitis or scleral icterus. ENT:  Oropharynx clear without lesion.  Tongue normal. Mucous membranes moist.  RESPIRATORY:  Clear to auscultation without rales, wheezes or rhonchi. CARDIOVASCULAR:  Regular rate and rhythm without murmur, rub or gallop. BREAST:  Right breast with well healed medial periareolar incision. Tender inferior quadrant post-operative and post-radiation changes.  No discrete masses, skin changes or nipple discharge.  Left breast with moderate tender fibrocystic changes.  No masses, skin changes or nipple discharge.   ABDOMEN:  Soft, non-tender, with active bowel sounds, and no appreciable hepatosplenomegaly.  No masses. SKIN:  No ulcers or lesions. EXTREMITIES: No edema, no skin discoloration or tenderness.  No palpable cords. LYMPH NODES: No palpable cervical,  supraclavicular, axillary or inguinal adenopathy  NEUROLOGICAL: Unremarkable. PSYCH:  Appropriate.    Appointment on 07/13/2017  Component Date Value Ref Range Status  . Sodium 07/13/2017 128* 135 - 145 mmol/L Final  . Potassium 07/13/2017 4.5  3.5 - 5.1 mmol/L Final  . Chloride 07/13/2017 97* 101 - 111 mmol/L Final  . CO2 07/13/2017 25  22 - 32 mmol/L Final  . Glucose, Bld 07/13/2017 276* 65 - 99 mg/dL Final  . BUN 07/13/2017 10  6 - 20 mg/dL Final  . Creatinine, Ser 07/13/2017 0.62  0.44 - 1.00 mg/dL Final  . Calcium 07/13/2017 9.0  8.9 - 10.3 mg/dL Final  . Total Protein 07/13/2017 7.1  6.5 - 8.1 g/dL Final  . Albumin 07/13/2017 3.4* 3.5 - 5.0 g/dL Final  . AST 07/13/2017 19  15 - 41 U/L Final  . ALT 07/13/2017 11* 14 - 54 U/L Final  . Alkaline Phosphatase 07/13/2017 55  38 - 126 U/L Final  . Total Bilirubin 07/13/2017 0.6  0.3 - 1.2 mg/dL Final  . GFR calc non Af Amer 07/13/2017 >60  >60 mL/min Final  . GFR calc Af Amer 07/13/2017 >60  >60 mL/min Final   Comment: (NOTE) The eGFR has been calculated using the CKD EPI equation. This calculation has not been validated in all clinical situations. eGFR's persistently <60 mL/min signify possible Chronic Kidney Disease.   . Anion gap 07/13/2017 6  5 - 15 Final  . WBC 07/13/2017 7.6  3.6 - 11.0 K/uL Final  . RBC 07/13/2017 4.05  3.80 - 5.20 MIL/uL Final  . Hemoglobin 07/13/2017 11.7* 12.0 - 16.0 g/dL Final  . HCT 07/13/2017 33.7* 35.0 - 47.0 % Final  . MCV 07/13/2017 83.1  80.0 - 100.0 fL Final  . MCH 07/13/2017 28.8  26.0 - 34.0 pg Final  . MCHC 07/13/2017 34.7  32.0 - 36.0 g/dL Final  . RDW 07/13/2017 12.3  11.5 - 14.5 % Final  . Platelets 07/13/2017 337  150 - 440 K/uL Final  . Neutrophils Relative % 07/13/2017 70  % Final  . Neutro Abs 07/13/2017 5.4  1.4 - 6.5 K/uL Final  .  Lymphocytes Relative 07/13/2017 21  % Final  . Lymphs Abs 07/13/2017 1.6  1.0 - 3.6 K/uL Final  . Monocytes Relative 07/13/2017 5  % Final  .  Monocytes Absolute 07/13/2017 0.4  0.2 - 0.9 K/uL Final  . Eosinophils Relative 07/13/2017 3  % Final  . Eosinophils Absolute 07/13/2017 0.2  0 - 0.7 K/uL Final  . Basophils Relative 07/13/2017 1  % Final  . Basophils Absolute 07/13/2017 0.0  0 - 0.1 K/uL Final    Assessment:  Christyna Saraiah Bhat is a 71 y.o. 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  Pathology revealed a 1.4 cm focus of grade 3 comedo type DCIS with microcalcifications and desmoplastic stromal response.  There was no evidence of invasive carcinoma. Margins were clear.  Tumor was ER was negative (< 1%) and PR was negative (0%).  She completed radiation to her right breast on 04/04/2014.   Mammogram on 12/17/2015 revealed a new group of 6 mm calcifications of the right breast which appeared around a central lucency suggestive of fat necrosis.  Right-sided mammogram on 06/17/2016 revealed calcifications within the medial right breasts benign consistent with evolving fat necrosis. There were no findings worrisome for malignancy.  Bilateral mammogram on 12/17/2016 revealed a stable right breast lumpectomy site.  There was no evidence of malignancy.  She has a history of iron deficiency.  She is on oral iron.  EGD on 08/14/2016 revealed a small hiatal hernia and a discolored mucosa in the gastric body (iron pill gastritis).  Esophagus biopsy revealed reflux without dysplasia or malignancy.  Stomach polyp was hyperplastic and negative for H pylori, dysplasia or malignancy.  Colonoscopy revealed 1 diminutive polyp in the descending colon, removed with forceps.  Pathology revealed a tubular adenoma which was negative for dysplasia or malignancy.  She has SIADH.  Sodium ranges between 126-134.  Symptomatically, she is tired.  Exam reveals chronic tender fibrocystic changes.  Sodium is 128.  Plan: 1.  Labs today:  CBC with diff, CMP. 2.  Bilateral mammogram on 12/17/2017. 3.  RTC in 6 months for MD assess and labs (CBC  with diff, CMP).   Lequita Asal, MD  07/13/2017, 1:58 PM

## 2017-12-18 ENCOUNTER — Ambulatory Visit
Admission: RE | Admit: 2017-12-18 | Discharge: 2017-12-18 | Disposition: A | Discharge status: Home / Self Care | Payer: Medicare Other | Source: Ambulatory Visit | Attending: Hematology and Oncology | Admitting: Hematology and Oncology

## 2017-12-18 DIAGNOSIS — D0511 Intraductal carcinoma in situ of right breast: Secondary | ICD-10-CM | POA: Insufficient documentation

## 2018-01-14 ENCOUNTER — Other Ambulatory Visit: Payer: Self-pay | Admitting: Hematology and Oncology

## 2018-01-14 ENCOUNTER — Other Ambulatory Visit: Payer: Self-pay

## 2018-01-14 ENCOUNTER — Inpatient Hospital Stay: Payer: Medicare Other

## 2018-01-14 ENCOUNTER — Inpatient Hospital Stay: Payer: Medicare Other | Attending: Hematology and Oncology | Admitting: Hematology and Oncology

## 2018-01-14 ENCOUNTER — Encounter: Payer: Self-pay | Admitting: Hematology and Oncology

## 2018-01-14 VITALS — BP 148/99 | HR 73 | Temp 97.4°F | Resp 16 | Ht 64.0 in | Wt 242.0 lb

## 2018-01-14 DIAGNOSIS — D0511 Intraductal carcinoma in situ of right breast: Secondary | ICD-10-CM | POA: Diagnosis not present

## 2018-01-14 DIAGNOSIS — D509 Iron deficiency anemia, unspecified: Secondary | ICD-10-CM | POA: Diagnosis not present

## 2018-01-14 DIAGNOSIS — Z923 Personal history of irradiation: Secondary | ICD-10-CM | POA: Diagnosis not present

## 2018-01-14 DIAGNOSIS — Z86010 Personal history of colonic polyps: Secondary | ICD-10-CM | POA: Diagnosis not present

## 2018-01-14 DIAGNOSIS — E871 Hypo-osmolality and hyponatremia: Secondary | ICD-10-CM | POA: Diagnosis not present

## 2018-01-14 DIAGNOSIS — Z171 Estrogen receptor negative status [ER-]: Secondary | ICD-10-CM | POA: Insufficient documentation

## 2018-01-14 DIAGNOSIS — Z86000 Personal history of in-situ neoplasm of breast: Secondary | ICD-10-CM | POA: Insufficient documentation

## 2018-01-14 LAB — CBC WITH DIFFERENTIAL/PLATELET
Basophils Absolute: 0 10*3/uL (ref 0–0.1)
Basophils Relative: 1 %
Eosinophils Absolute: 0.2 10*3/uL (ref 0–0.7)
Eosinophils Relative: 3 %
HCT: 35.7 % (ref 35.0–47.0)
Hemoglobin: 11.9 g/dL — ABNORMAL LOW (ref 12.0–16.0)
Lymphocytes Relative: 20 %
Lymphs Abs: 1.5 10*3/uL (ref 1.0–3.6)
MCH: 28.3 pg (ref 26.0–34.0)
MCHC: 33.4 g/dL (ref 32.0–36.0)
MCV: 84.8 fL (ref 80.0–100.0)
Monocytes Absolute: 0.4 10*3/uL (ref 0.2–0.9)
Monocytes Relative: 6 %
Neutro Abs: 5.4 10*3/uL (ref 1.4–6.5)
Neutrophils Relative %: 70 %
Platelets: 321 10*3/uL (ref 150–440)
RBC: 4.21 MIL/uL (ref 3.80–5.20)
RDW: 12.1 % (ref 11.5–14.5)
WBC: 7.6 10*3/uL (ref 3.6–11.0)

## 2018-01-14 LAB — COMPREHENSIVE METABOLIC PANEL
ALT: 19 U/L (ref 14–54)
AST: 27 U/L (ref 15–41)
Albumin: 3.6 g/dL (ref 3.5–5.0)
Alkaline Phosphatase: 69 U/L (ref 38–126)
Anion gap: 9 (ref 5–15)
BUN: 13 mg/dL (ref 6–20)
CO2: 28 mmol/L (ref 22–32)
Calcium: 9 mg/dL (ref 8.9–10.3)
Chloride: 96 mmol/L — ABNORMAL LOW (ref 101–111)
Creatinine, Ser: 0.72 mg/dL (ref 0.44–1.00)
GFR calc Af Amer: >60 mL/min (ref >60)
GFR calc non Af Amer: >60 mL/min (ref >60)
Glucose, Bld: 309 mg/dL — ABNORMAL HIGH (ref 65–99)
Potassium: 4.1 mmol/L (ref 3.5–5.1)
Sodium: 133 mmol/L — ABNORMAL LOW (ref 135–145)
Total Bilirubin: 0.7 mg/dL (ref 0.3–1.2)
Total Protein: 7.5 g/dL (ref 6.5–8.1)

## 2018-01-14 LAB — FERRITIN: Ferritin: 105 ng/mL (ref 11–307)

## 2018-01-14 LAB — IRON AND TIBC
Iron: 63 ug/dL (ref 28–170)
Saturation Ratios: 24 % (ref 10.4–31.8)
TIBC: 264 ug/dL (ref 250–450)
UIBC: 201 ug/dL

## 2018-01-14 NOTE — Progress Notes (Signed)
[facility] day:   01/14/18   Chief Complaint: Hailey Hawkins is a 72 y.o. female with right breast ductal carcinoma in situ who is seen for 6 month assessment.  HPI:  The patient was last seen in the medical oncology clinic on 07/13/2017.  At that time, she felt a little fatigued.    Exam revealed chronic tender fibrocystic changes.  Sodium was 128.  Bilateral mammogram on 12/18/2017 revealed no mammographic evidence of malignancy in either breast, status post right lumpectomy.  Symptomatically, patient is doing "about the same". Patient has been having trouble regulating her blood sugars. Pre-prandial CBGs this morning was 206. HgbA1c was elevated at 8.8. Patient does not perform monthly self breast exams. She states, "There are knots everywhere I wouldn't know what is new or what I am feeling".   She denies any B symptoms or interval infections. Patient is eating well. Her weight has increased by 6 pounds.    Past Medical History:  Diagnosis Date  . Anemia    pernicious  . Asthma   . Back pain   . Breast cancer ([place]) 2015   right c lumpectomy c radiation  . Breast injury Few Weeks ago   Patient fell a few weeks ago -- Rt breast may be tender when compressed  . DCIS (ductal carcinoma in situ) of breast   . Diabetes mellitus without complication ([place])   . Elevated cholesterol   . GERD (gastroesophageal reflux disease)   . Hashimoto's disease   . Hypertension   . Hypothyroidism   . MRSA infection   . Obesity goither  . SIADH (syndrome of inappropriate ADH production) ([place])   . SOB (shortness of breath)   . Tachycardia     Past Surgical History:  Procedure Laterality Date  . ABDOMINAL HYSTERECTOMY    . BREAST BIOPSY Right 2014  . BREAST LUMPECTOMY Right 2015   f/u with radiation   . CESAREAN SECTION     x2  . colononoscopy    . COLONOSCOPY N/A 08/14/2016   Procedure: COLONOSCOPY;  Surgeon: Manya Silvas, MD;  Location:  [facility] ENDOSCOPY;  Service: Endoscopy;  Laterality: N/A;  . COLONOSCOPY WITH ESOPHAGOGASTRODUODENOSCOPY (EGD)    . ESOPHAGOGASTRODUODENOSCOPY (EGD) WITH PROPOFOL N/A 08/14/2016   Procedure: ESOPHAGOGASTRODUODENOSCOPY (EGD) WITH PROPOFOL;  Surgeon: Manya Silvas, MD;  Location: [facility] ENDOSCOPY;  Service: Endoscopy;  Laterality: N/A;  . MASTECTOMY    . MASTECTOMY, PARTIAL Right   . REPLACEMENT TOTAL KNEE Right   . REPLACEMENT TOTAL KNEE Left   . THYROIDECTOMY, PARTIAL     caused throat paralysis  right side  . TONSILLECTOMY      Family History  Problem Relation Age of Onset  . Cancer Daughter        colon cancer; age 4  . Breast cancer Neg Hx     Social History:  reports that  has never smoked. she has never used smokeless tobacco. She reports that she does not drink alcohol or use drugs.  The patient lives in [place].  The patient is alone today.  Allergies:  Allergies  Allergen Reactions  . Methamphetamine Shortness Of Breath    Difficulty breathing (INHALER)  . Amoxicillin     Other reaction(s): Diarrhea and vomiting (finding)  . Antihistamines, Chlorpheniramine-Type     Other reaction(s): Other (See Comments) Severe dryness  . Ciprofloxacin     Other reaction(s): Distress (finding)  . Codeine     Other reaction(s):  Diarrhea and vomiting (finding), Weal or any derivitive  . Other Other (See Comments)    Birth control pills - blood clots Birth control pills - blood clots  . Penicillin G     Other reaction(s): Weal  . Meloxicam Palpitations    Current Medications: Current Outpatient Medications  Medication Sig Dispense Refill  . acetaminophen (TYLENOL) 500 MG tablet Take 1,000 mg by mouth every 6 (six) hours as needed for mild pain.    Marland Kitchen albuterol (PROVENTIL HFA;VENTOLIN HFA) 108 (90 BASE) MCG/ACT inhaler Inhale 2 puffs into the lungs every 4 (four) hours as needed for wheezing or shortness of breath.    . ALPRAZolam (XANAX) 0.5 MG tablet Take 0.5 mg by mouth  daily.    Marland Kitchen amLODipine (NORVASC) 2.5 MG tablet Take 2.5 mg by mouth daily.    Marland Kitchen atorvastatin (LIPITOR) 10 MG tablet Take by mouth.    . cyanocobalamin 1000 MCG tablet Inject 1,000 mcg into the muscle every 30 (thirty) days.    Marland Kitchen dextromethorphan-guaiFENesin (MUCINEX DM) 30-600 MG per 12 hr tablet Take 1 tablet by mouth 2 (two) times daily as needed for cough.    . diphenhydramine-acetaminophen (TYLENOL PM) 25-500 MG TABS Take 2 tablets by mouth at bedtime as needed.    . docusate sodium (COLACE) 100 MG capsule Take 100 mg by mouth 2 (two) times daily.    . ferrous sulfate 325 (65 FE) MG tablet Take 325 mg by mouth 2 (two) times daily with a meal.    . Fluticasone-Salmeterol (ADVAIR) 250-50 MCG/DOSE AEPB Inhale 1 puff into the lungs 2 (two) times daily.    . hydrALAZINE (APRESOLINE) 100 MG tablet     . insulin glargine (LANTUS) 100 UNIT/ML injection Inject 30 Units into the skin at bedtime.    Marland Kitchen levothyroxine (SYNTHROID, LEVOTHROID) 100 MCG tablet Take 100 mcg by mouth daily before breakfast.    . losartan (COZAAR) 50 MG tablet Take 50 mg by mouth 2 (two) times daily.    . magnesium oxide (MAG-OX) 400 MG tablet     . metFORMIN (GLUCOPHAGE) 1000 MG tablet Take 1,000 mg by mouth 2 (two) times daily with a meal.    . metoprolol succinate (TOPROL-XL) 50 MG 24 hr tablet Take 50 mg by mouth daily. Take with or immediately following a meal.    . omeprazole (PRILOSEC OTC) 20 MG tablet Take 20 mg by mouth 2 (two) times daily.    . OXYGEN Inhale 2 L into the lungs at bedtime.    . sucralfate (CARAFATE) 1 G tablet Take 1 g by mouth 4 (four) times daily.      No current facility-administered medications for this visit.     Review of Systems:  GENERAL:  Feels "ok".  No fevers or sweats.  Weight up 6 pounds. PERFORMANCE STATUS (ECOG):  1 HEENT:  No visual changes, runny nose, sore throat, mouth sores or tenderness. Lungs: No shortness of breath or cough.  No hemoptysis.  Oxygen at night. Cardiac:  No  chest pain, palpitations, orthopnea, or PND. Breasts:  Chronic tender breasts (right > left).  No nipple discharge.  GI:  History of colon polyps.  No nausea, vomiting, diarrhea, constipation, melena or hematochezia.  EGD and colonoscopy in 07/2016. GU:  No urgency, frequency, dysuria, or hematuria. Musculoskeletal:  No back pain.  No joint pain.  No muscle tenderness. Extremities:  No pain or swelling. Skin:  No rashes or skin changes. Neuro:  No headache, numbness or weakness, balance or  coordination issues. Endocrine:  Diabetes. Thyroid issues on Synthroid.  No hot flashes or night sweats. Psych:  No mood changes, depression or anxiety. Pain:  No focal pain. Review of systems:  All other systems reviewed and found to be negative.   Physical Exam: Blood pressure (!) 148/99, pulse 73, temperature (!) 97.4 F (36.3 C), temperature source Tympanic, resp. rate 16, height '5\' 4"'  (1.626 m), weight 242 lb (109.8 kg). GENERAL:  Well developed, well nourished, heavyset woman sitting comfortably in the exam room in no acute distress. MENTAL STATUS:  Alert and oriented to person, place and time. HEAD:  Short graying hair.  Normocephalic, atraumatic, face symmetric, no Cushingoid features. EYES:  Blue eyes.  Pupils equal round and reactive to light and accomodation.  No conjunctivitis or scleral icterus. ENT:  Oropharynx clear without lesion.  Tongue normal. Mucous membranes moist.  RESPIRATORY:  Clear to auscultation without rales, wheezes or rhonchi. CARDIOVASCULAR:  Regular rate and rhythm without murmur, rub or gallop. BREAST:  Right breast with well healed medial periareolar incision. Tender inferior quadrant post-operative and post-radiation changes.  No discrete masses, skin changes or nipple discharge.  Left breast with moderate tender fibrocystic changes.  No masses, skin changes or nipple discharge.   ABDOMEN:  Soft, non-tender, with active bowel sounds, and no appreciable hepatosplenomegaly.   No masses. SKIN:  No ulcers or lesions. EXTREMITIES: No edema, no skin discoloration or tenderness.  No palpable cords. LYMPH NODES: No palpable cervical, supraclavicular, axillary or inguinal adenopathy  NEUROLOGICAL: Unremarkable. PSYCH:  Appropriate.    Appointment on 01/14/2018  Component Date Value Ref Range Status  . WBC 01/14/2018 7.6  3.6 - 11.0 K/uL Final  . RBC 01/14/2018 4.21  3.80 - 5.20 MIL/uL Final  . Hemoglobin 01/14/2018 11.9* 12.0 - 16.0 g/dL Final  . HCT 01/14/2018 35.7  35.0 - 47.0 % Final  . MCV 01/14/2018 84.8  80.0 - 100.0 fL Final  . MCH 01/14/2018 28.3  26.0 - 34.0 pg Final  . MCHC 01/14/2018 33.4  32.0 - 36.0 g/dL Final  . RDW 01/14/2018 12.1  11.5 - 14.5 % Final  . Platelets 01/14/2018 321  150 - 440 K/uL Final  . Neutrophils Relative % 01/14/2018 70  % Final  . Neutro Abs 01/14/2018 5.4  1.4 - 6.5 K/uL Final  . Lymphocytes Relative 01/14/2018 20  % Final  . Lymphs Abs 01/14/2018 1.5  1.0 - 3.6 K/uL Final  . Monocytes Relative 01/14/2018 6  % Final  . Monocytes Absolute 01/14/2018 0.4  0.2 - 0.9 K/uL Final  . Eosinophils Relative 01/14/2018 3  % Final  . Eosinophils Absolute 01/14/2018 0.2  0 - 0.7 K/uL Final  . Basophils Relative 01/14/2018 1  % Final  . Basophils Absolute 01/14/2018 0.0  0 - 0.1 K/uL Final   Performed at [facility], [address] [place],  [address]  . Sodium 01/14/2018 133* 135 - 145 mmol/L Final  . Potassium 01/14/2018 4.1  3.5 - 5.1 mmol/L Final  . Chloride 01/14/2018 96* 101 - 111 mmol/L Final  . CO2 01/14/2018 28  22 - 32 mmol/L Final  . Glucose, Bld 01/14/2018 309* 65 - 99 mg/dL Final  . BUN 01/14/2018 13  6 - 20 mg/dL Final  . Creatinine, Ser 01/14/2018 0.72  0.44 - 1.00 mg/dL Final  . Calcium 01/14/2018 9.0  8.9 - 10.3 mg/dL Final  . Total Protein 01/14/2018 7.5  6.5 - 8.1 g/dL Final  . Albumin 01/14/2018 3.6  3.5 - 5.0 g/dL Final  . AST 01/14/2018 27  15 - 41 U/L Final  . ALT 01/14/2018 19  14 - 54 U/L  Final  . Alkaline Phosphatase 01/14/2018 69  38 - 126 U/L Final  . Total Bilirubin 01/14/2018 0.7  0.3 - 1.2 mg/dL Final  . GFR calc non Af Amer 01/14/2018 >60  >60 mL/min Final  . GFR calc Af Amer 01/14/2018 >60  >60 mL/min Final   Comment: (NOTE) The eGFR has been calculated using the CKD EPI equation. This calculation has not been validated in all clinical situations. eGFR's persistently <60 mL/min signify possible Chronic Kidney Disease.   Georgiann Hahn gap 01/14/2018 9  5 - 15 Final   Performed at [facility], [address] [place], [place] [address]    Assessment:  Teva Bronkema is a 72 y.o. 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  Pathology revealed a 1.4 cm focus of grade 3 comedo type DCIS with microcalcifications and desmoplastic stromal response.  There was no evidence of invasive carcinoma. Margins were clear.  Tumor was ER was negative (< 1%) and PR was negative (0%).  She completed radiation to her right breast on 04/04/2014.   Mammogram on 12/17/2015 revealed a new group of 6 mm calcifications of the right breast which appeared around a central lucency suggestive of fat necrosis.  Right-sided mammogram on 06/17/2016 revealed calcifications within the medial right breasts benign consistent with evolving fat necrosis.  Bilateral mammogram on 12/18/2017 revealed no evidence of malignancy.  She has a history of iron deficiency.  She is on oral iron.  EGD on 08/14/2016 revealed a small hiatal hernia and a discolored mucosa in the gastric body (iron pill gastritis).  Esophagus biopsy revealed reflux without dysplasia or malignancy.  Stomach polyp was hyperplastic and negative for H pylori, dysplasia or malignancy.  Colonoscopy on 08/14/2016 revealed 1 diminutive polyp in the descending colon, removed with forceps.  Pathology revealed a tubular adenoma which was negative for dysplasia or malignancy.  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  Sodium  ranges between 126-134.  Symptomatically, she is "ok". She is having troubles with her blood sugars. Exam reveals chronic tender fibrocystic changes.  Ferritin is 105.  Plan: 1.  Labs today:  CBC with diff, CMP, ferritin, iron studies. 2.  Review interval mammogram- no evidence of malignancy.  3.  RTC in 6 months for MD assess and labs (CBC with diff, CMP).   Honor Loh, NP  01/14/2018, 2:01 PM   I saw and evaluated the patient, participating in the key portions of the service and reviewing pertinent diagnostic studies and records.  I reviewed the nurse practitioner's note and agree with the findings and the plan.  The assessment and plan were discussed with the patient.  A few questions were asked by the patient and answered.   Nolon Stalls, MD 01/14/2018,2:01 PM

## 2018-01-14 NOTE — Progress Notes (Signed)
See follow up.Patient having pain in right breast. BP elevated.

## 2018-01-17 ENCOUNTER — Encounter: Payer: Self-pay | Admitting: Hematology and Oncology

## 2018-02-18 DIAGNOSIS — E1165 Type 2 diabetes mellitus with hyperglycemia: Secondary | ICD-10-CM | POA: Insufficient documentation

## 2018-02-18 DIAGNOSIS — IMO0002 Reserved for concepts with insufficient information to code with codable children: Secondary | ICD-10-CM | POA: Insufficient documentation

## 2018-06-24 ENCOUNTER — Encounter (INDEPENDENT_AMBULATORY_CARE_PROVIDER_SITE_OTHER): Payer: Self-pay

## 2018-06-24 ENCOUNTER — Other Ambulatory Visit: Payer: Self-pay

## 2018-06-24 ENCOUNTER — Encounter: Payer: Self-pay | Admitting: Radiation Oncology

## 2018-06-24 ENCOUNTER — Ambulatory Visit
Admission: RE | Admit: 2018-06-24 | Discharge: 2018-06-24 | Disposition: A | Discharge status: Home / Self Care | Payer: Medicare Other | Source: Ambulatory Visit | Attending: Radiation Oncology | Admitting: Radiation Oncology

## 2018-06-24 VITALS — BP 132/68 | HR 57 | Temp 98.3°F | Resp 20 | Wt 236.8 lb

## 2018-06-24 DIAGNOSIS — Z171 Estrogen receptor negative status [ER-]: Secondary | ICD-10-CM | POA: Diagnosis not present

## 2018-06-24 DIAGNOSIS — R5383 Other fatigue: Secondary | ICD-10-CM | POA: Insufficient documentation

## 2018-06-24 DIAGNOSIS — D0511 Intraductal carcinoma in situ of right breast: Secondary | ICD-10-CM | POA: Diagnosis present

## 2018-06-24 DIAGNOSIS — Z923 Personal history of irradiation: Secondary | ICD-10-CM | POA: Diagnosis not present

## 2018-06-24 NOTE — Progress Notes (Signed)
Radiation Oncology Follow up Note  Name: Hailey Brundidge   Date:   06/24/2018 MRN:  [id number] DOB: 02-Mar-1946    This 72 y.o. 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 Seen today in routine follow-up her major concern is fatigue. She specifically denies breast tenderness cough or bone pain.Marland Kitchenlast mammogram was back in December which I have reviewed was BI-RADS 2 benign. She is not on antiestrogen therapy.  COMPLICATIONS OF TREATMENT: none  FOLLOW UP COMPLIANCE: keeps appointments   PHYSICAL EXAM:  BP 132/68   Pulse (!) 57   Temp 98.3 F (36.8 C)   Resp 20   Wt 236 lb 12.4 oz (107.4 kg)   BMI 40.64 kg/m  Lungs are clear to A&P cardiac examination essentially unremarkable with regular rate and rhythm. No dominant mass or nodularity is noted in either breast in 2 positions examined. Incision is well-healed. No axillary or supraclavicular adenopathy is appreciated. Cosmetic result is excellent. Well-developed well-nourished patient in NAD. HEENT reveals PERLA, EOMI, discs not visualized.  Oral cavity is clear. No oral mucosal lesions are identified. Neck is clear without evidence of cervical or supraclavicular adenopathy. Lungs are clear to A&P. Cardiac examination is essentially unremarkable with regular rate and rhythm without murmur rub or thrill. Abdomen is benign with no organomegaly or masses noted. Motor sensory and DTR levels are equal and symmetric in the upper and lower extremities. Cranial nerves II through XII are grossly intact. Proprioception is intact. No peripheral adenopathy or edema is identified. No motor or sensory levels are noted. Crude visual fields are within normal  range.  RADIOLOGY RESULTS: mammogram is reviewed and compatible with the above-stated findings  PLAN: present time patient is doing well from a breast standpoint I've suggested she try some exercise to overcome some of the fatigue. I am please were overall progress. I've asked to see her back in 1 year for follow-up. Patient knows to call sooner with any concerns.  I would like to take this opportunity to thank you for allowing me to participate in the care of your patient.Noreene Filbert, MD

## 2018-07-15 ENCOUNTER — Inpatient Hospital Stay: Payer: Medicare Other | Attending: Hematology and Oncology

## 2018-07-15 ENCOUNTER — Inpatient Hospital Stay (HOSPITAL_BASED_OUTPATIENT_CLINIC_OR_DEPARTMENT_OTHER): Payer: Medicare Other | Admitting: Urgent Care

## 2018-07-15 ENCOUNTER — Other Ambulatory Visit: Payer: Self-pay

## 2018-07-15 ENCOUNTER — Other Ambulatory Visit: Payer: Self-pay | Admitting: Urgent Care

## 2018-07-15 VITALS — BP 116/72 | HR 40 | Temp 97.3°F | Wt 236.0 lb

## 2018-07-15 DIAGNOSIS — Z923 Personal history of irradiation: Secondary | ICD-10-CM | POA: Diagnosis not present

## 2018-07-15 DIAGNOSIS — D0511 Intraductal carcinoma in situ of right breast: Secondary | ICD-10-CM

## 2018-07-15 DIAGNOSIS — Z86000 Personal history of in-situ neoplasm of breast: Secondary | ICD-10-CM

## 2018-07-15 DIAGNOSIS — R001 Bradycardia, unspecified: Secondary | ICD-10-CM | POA: Diagnosis not present

## 2018-07-15 DIAGNOSIS — E871 Hypo-osmolality and hyponatremia: Secondary | ICD-10-CM | POA: Diagnosis not present

## 2018-07-15 DIAGNOSIS — R5383 Other fatigue: Secondary | ICD-10-CM | POA: Insufficient documentation

## 2018-07-15 DIAGNOSIS — E222 Syndrome of inappropriate secretion of antidiuretic hormone: Secondary | ICD-10-CM | POA: Diagnosis not present

## 2018-07-15 DIAGNOSIS — Z800 Family history of malignant neoplasm of digestive organs: Secondary | ICD-10-CM | POA: Diagnosis not present

## 2018-07-15 DIAGNOSIS — E119 Type 2 diabetes mellitus without complications: Secondary | ICD-10-CM | POA: Insufficient documentation

## 2018-07-15 DIAGNOSIS — M8580 Other specified disorders of bone density and structure, unspecified site: Secondary | ICD-10-CM

## 2018-07-15 DIAGNOSIS — I10 Essential (primary) hypertension: Secondary | ICD-10-CM | POA: Insufficient documentation

## 2018-07-15 DIAGNOSIS — I498 Other specified cardiac arrhythmias: Secondary | ICD-10-CM

## 2018-07-15 DIAGNOSIS — I499 Cardiac arrhythmia, unspecified: Secondary | ICD-10-CM

## 2018-07-15 LAB — CBC WITH DIFFERENTIAL/PLATELET
Basophils Absolute: 0 10*3/uL (ref 0–0.1)
Basophils Relative: 1 %
Eosinophils Absolute: 0.1 10*3/uL (ref 0–0.7)
Eosinophils Relative: 2 %
HCT: 33 % — ABNORMAL LOW (ref 35.0–47.0)
Hemoglobin: 11.3 g/dL — ABNORMAL LOW (ref 12.0–16.0)
Lymphocytes Relative: 30 %
Lymphs Abs: 2.1 10*3/uL (ref 1.0–3.6)
MCH: 28.6 pg (ref 26.0–34.0)
MCHC: 34.2 g/dL (ref 32.0–36.0)
MCV: 83.9 fL (ref 80.0–100.0)
Monocytes Absolute: 0.4 10*3/uL (ref 0.2–0.9)
Monocytes Relative: 6 %
Neutro Abs: 4.3 10*3/uL (ref 1.4–6.5)
Neutrophils Relative %: 61 %
Platelets: 271 10*3/uL (ref 150–440)
RBC: 3.94 MIL/uL (ref 3.80–5.20)
RDW: 12.9 % (ref 11.5–14.5)
WBC: 7 10*3/uL (ref 3.6–11.0)

## 2018-07-15 LAB — COMPREHENSIVE METABOLIC PANEL
ALT: 16 U/L (ref 0–44)
AST: 25 U/L (ref 15–41)
Albumin: 3.5 g/dL (ref 3.5–5.0)
Alkaline Phosphatase: 60 U/L (ref 38–126)
Anion gap: 9 (ref 5–15)
BUN: 12 mg/dL (ref 8–23)
CO2: 23 mmol/L (ref 22–32)
Calcium: 8.5 mg/dL — ABNORMAL LOW (ref 8.9–10.3)
Chloride: 99 mmol/L (ref 98–111)
Creatinine, Ser: 0.64 mg/dL (ref 0.44–1.00)
GFR calc Af Amer: >60 mL/min (ref >60)
GFR calc non Af Amer: >60 mL/min (ref >60)
Glucose, Bld: 226 mg/dL — ABNORMAL HIGH (ref 70–99)
Potassium: 4.2 mmol/L (ref 3.5–5.1)
Sodium: 131 mmol/L — ABNORMAL LOW (ref 135–145)
Total Bilirubin: 0.7 mg/dL (ref 0.3–1.2)
Total Protein: 7.3 g/dL (ref 6.5–8.1)

## 2018-07-15 NOTE — Progress Notes (Signed)
Patient offers no complaints today. 

## 2018-07-15 NOTE — Progress Notes (Signed)
[facility] day:   07/15/18   Chief Complaint: Hailey Hawkins is a 72 y.o. female with right breast ductal carcinoma in situ who is seen for 6 month assessment.  HPI:  The patient was last seen in the medical oncology clinic on 01/14/2018.  At that time, patient was doing "about the same".  Patient was having trouble regulating her blood sugars.  She denied any B symptoms or recurrent infections. Exam reveals chronic tender fibrocystic changes.  Ferritin is 105.  She was seen in follow up consult on 06/24/2018 by Dr. Noreene Filbert (radiation oncology).  Notes reviewed.  Patient was doing well overall.  Exercise suggested to help with patient's fatigue.  Patient scheduled follow-up with radiation oncologist in 1 year.  In the interim, patient continues to experience fatigue. She decreased decreased strength in her lower extremities. She makes mention of significant issues with her blood pressure that began in January of this year. She "finally got it under control" in May. Patient currently on 4 antihypertensive medications (metoprolol, clonidine, hydralazine, and Lotensin). She presents today with asymptomatic bradycardia at a rate of 40. Blood pressure stable at 116/72.   Patient denies that she has experienced any B symptoms. She denies any interval infections.  Patient does not verbalize any concerns with regards to her breasts today. Patient performs monthly self breast examinations as recommended.   Patient advises that she maintains an adequate appetite. She is eating well. Weight today is 236 lb (107 kg), which compared to her last visit to the clinic, represents a 6 pound decrease.     Patient denies pain in the clinic today.   Past Medical History:  Diagnosis Date  . Anemia    pernicious  . Asthma   . Back pain   . Breast cancer ([place]) 2015   right c lumpectomy c radiation  . Breast injury Few Weeks ago   Patient fell a few weeks ago  -- Rt breast may be tender when compressed  . DCIS (ductal carcinoma in situ) of breast   . Diabetes mellitus without complication ([place])   . Elevated cholesterol   . GERD (gastroesophageal reflux disease)   . Hashimoto's disease   . Hypertension   . Hypothyroidism   . MRSA infection   . Obesity goither  . SIADH (syndrome of inappropriate ADH production) ([place])   . SOB (shortness of breath)   . Tachycardia     Past Surgical History:  Procedure Laterality Date  . ABDOMINAL HYSTERECTOMY    . BREAST BIOPSY Right 2014  . BREAST LUMPECTOMY Right 2015   f/u with radiation   . CESAREAN SECTION     x2  . colononoscopy    . COLONOSCOPY N/A 08/14/2016   Procedure: COLONOSCOPY;  Surgeon: Manya Silvas, MD;  Location: [facility] ENDOSCOPY;  Service: Endoscopy;  Laterality: N/A;  . COLONOSCOPY WITH ESOPHAGOGASTRODUODENOSCOPY (EGD)    . ESOPHAGOGASTRODUODENOSCOPY (EGD) WITH PROPOFOL N/A 08/14/2016   Procedure: ESOPHAGOGASTRODUODENOSCOPY (EGD) WITH PROPOFOL;  Surgeon: Manya Silvas, MD;  Location: [facility] ENDOSCOPY;  Service: Endoscopy;  Laterality: N/A;  . MASTECTOMY    . MASTECTOMY, PARTIAL Right   . REPLACEMENT TOTAL KNEE Right   . REPLACEMENT TOTAL KNEE Left   . THYROIDECTOMY, PARTIAL     caused throat paralysis  right side  . TONSILLECTOMY      Family History  Problem Relation Age of Onset  . Cancer Daughter        colon  cancer; age 56  . Breast cancer Neg Hx     Social History:  reports that she has never smoked. She has never used smokeless tobacco. She reports that she does not drink alcohol or use drugs.  The patient lives in [place].  The patient is alone today.  Allergies:  Allergies  Allergen Reactions  . Methamphetamine Shortness Of Breath    Difficulty breathing (INHALER)  . Amoxicillin     Other reaction(s): Diarrhea and vomiting (finding)  . Antihistamines, Chlorpheniramine-Type     Other reaction(s): Other (See Comments) Severe dryness  . Ciprofloxacin      Other reaction(s): Distress (finding)  . Codeine     Other reaction(s): Diarrhea and vomiting (finding), Weal or any derivitive  . Other Other (See Comments)    Birth control pills - blood clots Birth control pills - blood clots  . Penicillin G     Other reaction(s): Weal  . Meloxicam Palpitations    Current Medications: Current Outpatient Medications  Medication Sig Dispense Refill  . acetaminophen (TYLENOL) 500 MG tablet Take 1,000 mg by mouth every 6 (six) hours as needed for mild pain.    Marland Kitchen albuterol (PROVENTIL HFA;VENTOLIN HFA) 108 (90 BASE) MCG/ACT inhaler Inhale 2 puffs into the lungs every 4 (four) hours as needed for wheezing or shortness of breath.    . ALPRAZolam (XANAX) 0.5 MG tablet Take 0.5 mg by mouth daily.    Marland Kitchen atorvastatin (LIPITOR) 10 MG tablet Take by mouth.    . benazepril (LOTENSIN) 40 MG tablet     . cloNIDine (CATAPRES) 0.2 MG tablet Take by mouth.    . cyanocobalamin 1000 MCG tablet Inject 1,000 mcg into the muscle every 30 (thirty) days.    Marland Kitchen dextromethorphan-guaiFENesin (MUCINEX DM) 30-600 MG per 12 hr tablet Take 1 tablet by mouth 2 (two) times daily as needed for cough.    . diphenhydramine-acetaminophen (TYLENOL PM) 25-500 MG TABS Take 2 tablets by mouth at bedtime as needed.    . docusate sodium (COLACE) 100 MG capsule Take 100 mg by mouth 2 (two) times daily.    . ferrous sulfate 325 (65 FE) MG tablet Take 325 mg by mouth 2 (two) times daily with a meal.    . Fluticasone-Salmeterol (ADVAIR) 250-50 MCG/DOSE AEPB Inhale 1 puff into the lungs 2 (two) times daily.    . hydrALAZINE (APRESOLINE) 100 MG tablet     . insulin glargine (LANTUS) 100 UNIT/ML injection Inject 30 Units into the skin at bedtime.    Marland Kitchen levothyroxine (SYNTHROID, LEVOTHROID) 100 MCG tablet Take 100 mcg by mouth daily before breakfast.    . magnesium oxide (MAG-OX) 400 MG tablet     . metFORMIN (GLUCOPHAGE) 1000 MG tablet Take 1,000 mg by mouth 2 (two) times daily with a meal.    .  metoprolol succinate (TOPROL-XL) 50 MG 24 hr tablet Take 50 mg by mouth daily. Take with or immediately following a meal.    . omeprazole (PRILOSEC OTC) 20 MG tablet Take 20 mg by mouth 2 (two) times daily.    . OXYGEN Inhale 2 L into the lungs at bedtime.    . sucralfate (CARAFATE) 1 G tablet Take 1 g by mouth 4 (four) times daily.      No current facility-administered medications for this visit.     Review of Systems  Constitutional: Positive for malaise/fatigue and weight loss (down 6 pounds). Negative for diaphoresis and fever.       "Im doing ok. Im  just tired".   HENT: Negative.   Eyes: Negative.   Respiratory: Negative for cough, hemoptysis, sputum production and shortness of breath.        Nocturnal supplemental oxygen use.  Cardiovascular: Negative for chest pain, palpitations, orthopnea, leg swelling and PND.       Bradycardic at rate of 40. Takes 4 antihypertensives.   Gastrointestinal: Negative for abdominal pain, blood in stool, constipation, diarrhea, melena, nausea and vomiting.       PMH (+) for colonic polyps.  Last EGD and colonoscopy was in 07/2016.  Genitourinary: Negative for dysuria, frequency, hematuria and urgency.  Musculoskeletal: Negative for back pain, falls, joint pain and myalgias.  Skin: Negative for itching and rash.  Neurological: Negative for dizziness, tremors, weakness and headaches.  Endo/Heme/Allergies: Does not bruise/bleed easily.       Diabetes  Psychiatric/Behavioral: Negative for depression, memory loss and suicidal ideas. The patient is not nervous/anxious and does not have insomnia.   All other systems reviewed and are negative.  Performance status (ECOG): 1 - Symptomatic but completely ambulatory  Vital Signs BP 116/72 (BP Location: Left Arm, Patient Position: Sitting)   Pulse (!) 40   Temp (!) 97.3 F (36.3 C) (Tympanic)   Wt 236 lb (107 kg)   SpO2 97%   BMI 40.51 kg/m   Physical Exam  Constitutional: She is oriented to person,  place, and time and well-developed, well-nourished, and in no distress.  HENT:  Head: Normocephalic and atraumatic.  Hailey Hawkins hair  Eyes: Pupils are equal, round, and reactive to light. EOM are normal. No scleral icterus.  Blue eyes  Neck: Normal range of motion. Neck supple. No tracheal deviation present. No thyromegaly present.  Cardiovascular: Regular rhythm and normal heart sounds. Bradycardia present. Exam reveals no gallop and no friction rub.  No murmur heard. EKG done that showed SR with PACs in a bigeminy pattern; left axis deviation; rate 72; no ST segment changes; QTc 462.   Pulmonary/Chest: Effort normal and breath sounds normal. No respiratory distress. She has no wheezes. She has no rales. Right breast exhibits skin change (well healed medial periareolar incision) and tenderness (inferior quadrant post treatment changes). Right breast exhibits no nipple discharge. Left breast exhibits skin change (moderate tender fibrocystic changes) and tenderness. Left breast exhibits no nipple discharge.  Abdominal: Soft. Bowel sounds are normal. She exhibits no distension. There is no tenderness.  Musculoskeletal: Normal range of motion. She exhibits no edema or tenderness.  Lymphadenopathy:    She has no cervical adenopathy.    She has no axillary adenopathy.       Right: No inguinal and no supraclavicular adenopathy present.       Left: No inguinal and no supraclavicular adenopathy present.  Neurological: She is alert and oriented to person, place, and time.  Skin: Skin is warm and dry. No rash noted. No erythema.  Psychiatric: Mood, affect and judgment normal.  Nursing note and vitals reviewed.   Orders Only on 07/15/2018  Component Date Value Ref Range Status  . Sodium 07/15/2018 131* 135 - 145 mmol/L Final  . Potassium 07/15/2018 4.2  3.5 - 5.1 mmol/L Final  . Chloride 07/15/2018 99  98 - 111 mmol/L Final   Please note change in reference range.  . CO2 07/15/2018 23  22 - 32 mmol/L  Final  . Glucose, Bld 07/15/2018 226* 70 - 99 mg/dL Final   Please note change in reference range.  . BUN 07/15/2018 12  8 - 23 mg/dL Final  Please note change in reference range.  . Creatinine, Ser 07/15/2018 0.64  0.44 - 1.00 mg/dL Final  . Calcium 07/15/2018 8.5* 8.9 - 10.3 mg/dL Final  . Total Protein 07/15/2018 7.3  6.5 - 8.1 g/dL Final  . Albumin 07/15/2018 3.5  3.5 - 5.0 g/dL Final  . AST 07/15/2018 25  15 - 41 U/L Final  . ALT 07/15/2018 16  0 - 44 U/L Final   Please note change in reference range.  . Alkaline Phosphatase 07/15/2018 60  38 - 126 U/L Final  . Total Bilirubin 07/15/2018 0.7  0.3 - 1.2 mg/dL Final  . GFR calc non Af Amer 07/15/2018 >60  >60 mL/min Final  . GFR calc Af Amer 07/15/2018 >60  >60 mL/min Final   Comment: (NOTE) The eGFR has been calculated using the CKD EPI equation. This calculation has not been validated in all clinical situations. eGFR's persistently <60 mL/min signify possible Chronic Kidney Disease.   Georgiann Hahn gap 07/15/2018 9  5 - 15 Final   Performed at [facility], [address] [place], [place] [address]  . WBC 07/15/2018 7.0  3.6 - 11.0 K/uL Final  . RBC 07/15/2018 3.94  3.80 - 5.20 MIL/uL Final  . Hemoglobin 07/15/2018 11.3* 12.0 - 16.0 g/dL Final  . HCT 07/15/2018 33.0* 35.0 - 47.0 % Final  . MCV 07/15/2018 83.9  80.0 - 100.0 fL Final  . MCH 07/15/2018 28.6  26.0 - 34.0 pg Final  . MCHC 07/15/2018 34.2  32.0 - 36.0 g/dL Final  . RDW 07/15/2018 12.9  11.5 - 14.5 % Final  . Platelets 07/15/2018 271  150 - 440 K/uL Final  . Neutrophils Relative % 07/15/2018 61  % Final  . Neutro Abs 07/15/2018 4.3  1.4 - 6.5 K/uL Final  . Lymphocytes Relative 07/15/2018 30  % Final  . Lymphs Abs 07/15/2018 2.1  1.0 - 3.6 K/uL Final  . Monocytes Relative 07/15/2018 6  % Final  . Monocytes Absolute 07/15/2018 0.4  0.2 - 0.9 K/uL Final  . Eosinophils Relative 07/15/2018 2  % Final  . Eosinophils Absolute 07/15/2018 0.1  0 - 0.7 K/uL Final  .  Basophils Relative 07/15/2018 1  % Final  . Basophils Absolute 07/15/2018 0.0  0 - 0.1 K/uL Final   Performed at [facility], [address], [place], [place] [address]    Assessment:  Makenzi Bannister is a 72 y.o. 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  Pathology revealed a 1.4 cm focus of grade 3 comedo type DCIS with microcalcifications and desmoplastic stromal response.  There was no evidence of invasive carcinoma. Margins were clear.  Tumor was ER was negative (< 1%) and PR was negative (0%).  She completed radiation to her right breast on 04/04/2014.   Mammogram on 12/17/2015 revealed a new group of 6 mm calcifications of the right breast which appeared around a central lucency suggestive of fat necrosis.  Right-sided mammogram on 06/17/2016 revealed calcifications within the medial right breasts benign consistent with evolving fat necrosis.  Bilateral mammogram on 12/18/2017 revealed no evidence of malignancy.  She has a history of iron deficiency.  She is on oral iron.  EGD on 08/14/2016 revealed a small hiatal hernia and a discolored mucosa in the gastric body (iron pill gastritis).  Esophagus biopsy revealed reflux without dysplasia or malignancy.  Stomach polyp was hyperplastic and negative for H pylori, dysplasia or malignancy.  Colonoscopy on 08/14/2016 revealed 1 diminutive  polyp in the descending colon, removed with forceps.  Pathology revealed a tubular adenoma which was negative for dysplasia or malignancy.  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  Sodium ranges between 126-134.  Symptomatically, patient is doing "ok". She is fatigued. She has been having difficulties with her blood pressure, and currently being managed with 4 antihypertensives. Patient bradycardic in clinic today at a rate of 40. Exam reveals chronic tender fibrocystic changes, but is otherwise unremarkable. Sodium 131.   Plan: 1. Labs today:  CBC with diff, CMP, ferritin,  iron studies. Discuss bradycardia. Patient is asymptomatic. 12 lead ECG preformed in the clinic today that demonstrates SR with PACs in a bigeminy pattern, left axis deviation, at a rate of 72, There is no evidence of ST segment elevation or depression. QTc is 462. Patient denies chest pain, shortness of breath, and palpitations. Patient encouraged to follow up with PCP and/or cardiology regarding episodes of asymptomatic bradycardia.  Discuss HYPOnatremia. Sodium 131 today and related to known SIADH. Continue to monitor.  Schedule annual mammogram and breast ultrasounds on 12/31/2018.  RTC in 6 months for MD assess and labs (CBC with diff, CMP, ferritin, iron studies).  Honor Loh, NP  07/15/2018, 2:23 PM

## 2018-08-11 ENCOUNTER — Ambulatory Visit
Admission: RE | Admit: 2018-08-11 | Discharge: 2018-08-11 | Disposition: A | Discharge status: Home / Self Care | Payer: Medicare Other | Source: Ambulatory Visit | Attending: Urgent Care | Admitting: Urgent Care

## 2018-08-11 DIAGNOSIS — M85851 Other specified disorders of bone density and structure, right thigh: Secondary | ICD-10-CM | POA: Insufficient documentation

## 2018-08-11 DIAGNOSIS — D0511 Intraductal carcinoma in situ of right breast: Secondary | ICD-10-CM | POA: Diagnosis present

## 2018-08-11 DIAGNOSIS — Z780 Asymptomatic menopausal state: Secondary | ICD-10-CM | POA: Diagnosis not present

## 2018-08-11 DIAGNOSIS — M85832 Other specified disorders of bone density and structure, left forearm: Secondary | ICD-10-CM | POA: Diagnosis not present

## 2018-08-11 DIAGNOSIS — M8580 Other specified disorders of bone density and structure, unspecified site: Secondary | ICD-10-CM | POA: Diagnosis present

## 2018-09-14 ENCOUNTER — Other Ambulatory Visit: Payer: Self-pay

## 2018-09-14 ENCOUNTER — Emergency Department
Admission: EM | Admit: 2018-09-14 | Discharge: 2018-09-14 | Disposition: A | Discharge status: Home / Self Care | Payer: Medicare Other | Attending: Emergency Medicine | Admitting: Emergency Medicine

## 2018-09-14 DIAGNOSIS — Z79899 Other long term (current) drug therapy: Secondary | ICD-10-CM | POA: Diagnosis not present

## 2018-09-14 DIAGNOSIS — K644 Residual hemorrhoidal skin tags: Secondary | ICD-10-CM | POA: Diagnosis not present

## 2018-09-14 DIAGNOSIS — E039 Hypothyroidism, unspecified: Secondary | ICD-10-CM | POA: Insufficient documentation

## 2018-09-14 DIAGNOSIS — I10 Essential (primary) hypertension: Secondary | ICD-10-CM | POA: Insufficient documentation

## 2018-09-14 DIAGNOSIS — Z794 Long term (current) use of insulin: Secondary | ICD-10-CM | POA: Insufficient documentation

## 2018-09-14 DIAGNOSIS — E119 Type 2 diabetes mellitus without complications: Secondary | ICD-10-CM | POA: Diagnosis not present

## 2018-09-14 DIAGNOSIS — K625 Hemorrhage of anus and rectum: Secondary | ICD-10-CM | POA: Diagnosis present

## 2018-09-14 DIAGNOSIS — J45909 Unspecified asthma, uncomplicated: Secondary | ICD-10-CM | POA: Diagnosis not present

## 2018-09-14 DIAGNOSIS — Z853 Personal history of malignant neoplasm of breast: Secondary | ICD-10-CM | POA: Insufficient documentation

## 2018-09-14 LAB — CBC
HCT: 34.5 % — ABNORMAL LOW (ref 35.0–47.0)
HEMOGLOBIN: 11.9 g/dL — AB (ref 12.0–16.0)
MCH: 29.5 pg (ref 26.0–34.0)
MCHC: 34.6 g/dL (ref 32.0–36.0)
MCV: 85.2 fL (ref 80.0–100.0)
PLATELETS: 290 10*3/uL (ref 150–440)
RBC: 4.05 MIL/uL (ref 3.80–5.20)
RDW: 12.5 % (ref 11.5–14.5)
WBC: 7.5 10*3/uL (ref 3.6–11.0)

## 2018-09-14 LAB — COMPREHENSIVE METABOLIC PANEL
ALBUMIN: 3.8 g/dL (ref 3.5–5.0)
ALK PHOS: 48 U/L (ref 38–126)
ALT: 12 U/L (ref 0–44)
AST: 23 U/L (ref 15–41)
Anion gap: 10 (ref 5–15)
BILIRUBIN TOTAL: 0.7 mg/dL (ref 0.3–1.2)
BUN: 12 mg/dL (ref 8–23)
CALCIUM: 9 mg/dL (ref 8.9–10.3)
CO2: 25 mmol/L (ref 22–32)
CREATININE: 0.6 mg/dL (ref 0.44–1.00)
Chloride: 98 mmol/L (ref 98–111)
GFR calc Af Amer: >60 mL/min (ref >60)
GFR calc non Af Amer: >60 mL/min (ref >60)
GLUCOSE: 188 mg/dL — AB (ref 70–99)
Potassium: 4.1 mmol/L (ref 3.5–5.1)
SODIUM: 133 mmol/L — AB (ref 135–145)
TOTAL PROTEIN: 7.5 g/dL (ref 6.5–8.1)

## 2018-09-14 MED ORDER — PRAMOXINE HCL 1 % RE FOAM: 1.0000 "application " | Freq: Three times a day (TID) | RECTAL | 0 refills | Status: DC | PRN

## 2018-09-14 MED ORDER — DOCUSATE SODIUM 100 MG PO CAPS: 200.0000 mg | ORAL_CAPSULE | Freq: Two times a day (BID) | ORAL | 0 refills | Status: DC

## 2018-09-14 MED ORDER — TUCKS 50 % EX PADS: 1.0000 "application " | MEDICATED_PAD | CUTANEOUS | 2 refills | Status: DC | PRN

## 2018-09-14 NOTE — ED Triage Notes (Signed)
Pt arrives to ed via POV. Pt states noticed rectal bleeding this morning after using bathroom. Pt states she is still passing bright red blood. VS WNL. Pt states she has had hemorrhoids in past but never had bleeding other than small dot with previous episodes. NAD noted at this time.

## 2018-09-14 NOTE — ED Provider Notes (Signed)
[facility] Emergency Department Provider Note  ____________________________________________  Time seen: Approximately 9:40 PM  I have reviewed the triage vital signs and the nursing notes.   HISTORY  Chief Complaint Rectal Bleeding    HPI Hailey Hawkins is a 72 y.o. female with a history of diabetes, hypertension, SIADH, anemia who complains of rectal bleeding that started at 11:00 this morning, only blood on the tissue paper after having a bowel movement.  The bleeding stopped after she was finished cleaning up, and then later in the day she had another bowel movement that was normal but afterward again there was blood on the tissue paper.  Stopped again after she was finished.  Denies any chest pain shortness of breath dizziness lightheadedness fatigue or chills.  No blood thinner use.  No history of GI bleed.  She does report straining frequently.  She has Colace at home but does not take it.      Past Medical History:  Diagnosis Date  . Anemia    pernicious  . Asthma   . Back pain   . Breast cancer ([place]) 2015   right c lumpectomy c radiation  . Breast injury Few Weeks ago   Patient fell a few weeks ago -- Rt breast may be tender when compressed  . DCIS (ductal carcinoma in situ) of breast   . Diabetes mellitus without complication ([place])   . Elevated cholesterol   . GERD (gastroesophageal reflux disease)   . Hashimoto's disease   . Hypertension   . Hypothyroidism   . MRSA infection   . Obesity goither  . SIADH (syndrome of inappropriate ADH production) ([place])   . SOB (shortness of breath)   . Tachycardia      Patient Active Problem List   Diagnosis Date Noted  . Iron deficiency anemia 01/14/2018  . 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 ABDOMINAL HYSTERECTOMY    . BREAST BIOPSY Right 2014  . BREAST LUMPECTOMY Right 2015   f/u with radiation   . CESAREAN  SECTION     x2  . colononoscopy    . COLONOSCOPY N/A 08/14/2016   Procedure: COLONOSCOPY;  Surgeon: Manya Silvas, MD;  Location: [facility] ENDOSCOPY;  Service: Endoscopy;  Laterality: N/A;  . COLONOSCOPY WITH ESOPHAGOGASTRODUODENOSCOPY (EGD)    . ESOPHAGOGASTRODUODENOSCOPY (EGD) WITH PROPOFOL N/A 08/14/2016   Procedure: ESOPHAGOGASTRODUODENOSCOPY (EGD) WITH PROPOFOL;  Surgeon: Manya Silvas, MD;  Location: [facility] ENDOSCOPY;  Service: Endoscopy;  Laterality: N/A;  . MASTECTOMY    . MASTECTOMY, PARTIAL Right   . REPLACEMENT TOTAL KNEE Right   . REPLACEMENT TOTAL KNEE Left   . THYROIDECTOMY, PARTIAL     caused throat paralysis  right side  . TONSILLECTOMY       Prior to Admission medications   Medication Sig Start Date End Date Taking? Authorizing Provider  acetaminophen (TYLENOL) 500 MG tablet Take 1,000 mg by mouth every 6 (six) hours as needed for mild pain.    [provider]  albuterol (PROVENTIL HFA;VENTOLIN HFA) 108 (90 BASE) MCG/ACT inhaler Inhale 2 puffs into the lungs every 4 (four) hours as needed for wheezing or shortness of breath.    [provider]  ALPRAZolam Duanne Moron) 0.5 MG tablet Take 0.5 mg by mouth daily.    [provider]  atorvastatin (LIPITOR) 10 MG tablet Take by mouth. 11/04/16 07/15/18  [provider]  benazepril (LOTENSIN) 40  MG tablet  05/27/18   [provider]  cloNIDine (CATAPRES) 0.2 MG tablet Take by mouth. 04/26/18 04/26/19  [provider]  cyanocobalamin 1000 MCG tablet Inject 1,000 mcg into the muscle every 30 (thirty) days.    [provider]  dextromethorphan-guaiFENesin (MUCINEX DM) 30-600 MG per 12 hr tablet Take 1 tablet by mouth 2 (two) times daily as needed for cough.    [provider]  diphenhydramine-acetaminophen (TYLENOL PM) 25-500 MG TABS Take 2 tablets by mouth at bedtime as needed.    [provider]  docusate sodium (COLACE) 100 MG capsule Take 100 mg by mouth 2  (two) times daily.    [provider]  docusate sodium (COLACE) 100 MG capsule Take 2 capsules (200 mg total) by mouth 2 (two) times daily. 09/14/18   Carrie Mew, MD  ferrous sulfate 325 (65 FE) MG tablet Take 325 mg by mouth 2 (two) times daily with a meal.    [provider]  Fluticasone-Salmeterol (ADVAIR) 250-50 MCG/DOSE AEPB Inhale 1 puff into the lungs 2 (two) times daily.    [provider]  hydrALAZINE (APRESOLINE) 100 MG tablet  12/05/16   [provider]  insulin glargine (LANTUS) 100 UNIT/ML injection Inject 30 Units into the skin at bedtime.    [provider]  levothyroxine (SYNTHROID, LEVOTHROID) 100 MCG tablet Take 100 mcg by mouth daily before breakfast.    [provider]  magnesium oxide (MAG-OX) 400 MG tablet  05/08/16   [provider]  metFORMIN (GLUCOPHAGE) 1000 MG tablet Take 1,000 mg by mouth 2 (two) times daily with a meal.    [provider]  metoprolol succinate (TOPROL-XL) 50 MG 24 hr tablet Take 50 mg by mouth daily. Take with or immediately following a meal.    [provider]  omeprazole (PRILOSEC OTC) 20 MG tablet Take 20 mg by mouth 2 (two) times daily.    [provider]  OXYGEN Inhale 2 L into the lungs at bedtime.    [provider]  pramoxine (PROCTOFOAM) 1 % foam Place 1 application rectally 3 (three) times daily as needed for hemorrhoids. 09/14/18   Carrie Mew, MD  sucralfate (CARAFATE) 1 G tablet Take 1 g by mouth 4 (four) times daily.     [provider]  Witch Hazel (TUCKS) 50 % PADS Apply 1 application topically every 2 (two) hours as needed. 09/14/18   Carrie Mew, MD     Allergies Methamphetamine; Amoxicillin; Antihistamines, chlorpheniramine-type; Ciprofloxacin; Codeine; Other; Penicillin g; and Meloxicam   Family History  Problem Relation Age of Onset  . Cancer Daughter        colon cancer; age 73  . Breast cancer Neg Hx      Social History Social History   Tobacco Use  . Smoking status: Never Smoker  . Smokeless tobacco: Never Used  Substance Use Topics  . Alcohol use: No    Alcohol/week: 0.0 standard drinks  . Drug use: No    Review of Systems  Constitutional:   No fever or chills.  Cardiovascular:   No chest pain or syncope. Respiratory:   No dyspnea or cough. Gastrointestinal:   Negative for abdominal pain, vomiting and diarrhea.  Positive rectal bleeding as above Musculoskeletal:   Negative for focal pain or swelling All other systems reviewed and are negative except as documented above in ROS and HPI.  ____________________________________________   PHYSICAL EXAM:  VITAL SIGNS: ED Triage Vitals  Enc Vitals Group  BP 09/14/18 2020 (!) 159/73     Pulse Rate 09/14/18 2020 65     Resp 09/14/18 2020 18     Temp --      Temp src --      SpO2 09/14/18 2020 94 %     Weight 09/14/18 1621 130 lb (59 kg)     Height 09/14/18 1621 5\' 5"  (1.651 m)     Head Circumference --      Peak Flow --      Pain Score --      Pain Loc --      Pain Edu? --      Excl. in [place]? --     Vital signs reviewed, nursing assessments reviewed.   Constitutional:   Alert and oriented. Non-toxic appearance. Eyes:   Conjunctivae are normal. EOMI. PERRL. ENT      Head:   Normocephalic and atraumatic.      Nose:   No congestion/rhinnorhea.       Mouth/Throat:   MMM, no pharyngeal erythema. No peritonsillar mass.       Neck:   No meningismus. Full ROM. Hematological/Lymphatic/Immunilogical:   No cervical lymphadenopathy. Cardiovascular:   RRR. Symmetric bilateral radial and DP pulses.  No murmurs. Cap refill less than 2 seconds. Respiratory:   Normal respiratory effort without tachypnea/retractions. Breath sounds are clear and equal bilaterally. No wheezes/rales/rhonchi. Gastrointestinal:   Soft and nontender. Non distended. There is no CVA tenderness.  No rebound, rigidity, or guarding.  Rectal exam performed  with nurse Sherie at bedside.  There is a 2 cm external hemorrhoid with evidence of recent bleeding and thrombus and clot formation over the surface.  Currently hemostatic.  Musculoskeletal:   Normal range of motion in all extremities. No joint effusions.  No lower extremity tenderness.  No edema. Neurologic:   Normal speech and language.  Motor grossly intact. No acute focal neurologic deficits are appreciated.  Skin:    Skin is warm, dry and intact. No rash noted.  No petechiae, purpura, or bullae.  ____________________________________________    LABS (pertinent positives/negatives) (all labs ordered are listed, but only abnormal results are displayed) Labs Reviewed  COMPREHENSIVE METABOLIC PANEL - Abnormal; Notable for the following components:      Result Value   Sodium 133 (*)    Glucose, Bld 188 (*)    All other components within normal limits  CBC - Abnormal; Notable for the following components:   Hemoglobin 11.9 (*)    HCT 34.5 (*)    All other components within normal limits  POC OCCULT BLOOD, ED  TYPE AND SCREEN  ABO/RH   ____________________________________________   EKG    ____________________________________________    RADIOLOGY  No results found.  ____________________________________________   PROCEDURES Procedures  ____________________________________________    CLINICAL IMPRESSION / ASSESSMENT AND PLAN / ED COURSE  Pertinent labs & imaging results that were available during my care of the patient were reviewed by me and considered in my medical decision making (see chart for details).    Patient presents with a few episodes of bright red blood after having a bowel movement.  No large bloody stool.  Doubt upper GI bleed or other intestinal bleed, not diverticular.  She had a colonoscopy 2 years ago which was overall benign according to the patient, and the fact that she recently had screening is also very reassuring.  Exam is diagnostic of a  bleeding external hemorrhoid which we will treat symptomatically, follow-up with primary care and  surgery if needed.      ____________________________________________   FINAL CLINICAL IMPRESSION(S) / ED DIAGNOSES    Final diagnoses:  Bleeding external hemorrhoids     ED Discharge Orders         Ordered    docusate sodium (COLACE) 100 MG capsule  2 times daily     09/14/18 2139    Witch Hazel (TUCKS) 50 % PADS  Every 2 hours PRN     09/14/18 2139    pramoxine (PROCTOFOAM) 1 % foam  3 times daily PRN     09/14/18 2139          Portions of this note were generated with dragon dictation software. Dictation errors may occur despite best attempts at proofreading.    Carrie Mew, MD 09/14/18 [phone]

## 2018-09-14 NOTE — ED Notes (Signed)
Pt with rectal bleeding since this morning at 1100. Pt states when she was wiping she noticed bright red blood on the tissue paper and has been leaking since then. Pt AxOx4. No distress at this time.

## 2018-09-14 NOTE — Discharge Instructions (Addendum)
Take Colace 2 times a day to keep your stool soft and avoid straining.  Tucks witch hazel pads can help soothe the area of the hemorrhoid and promote healing.  Preparation H over-the-counter can also be helpful.  Proctofoam can decrease symptoms if you are having a lot of irritation.

## 2018-09-15 LAB — TYPE AND SCREEN
ABO/RH(D): A POS
Antibody Screen: POSITIVE
UNIT DIVISION: 0
Unit division: 0

## 2018-09-15 LAB — BPAM RBC
BLOOD PRODUCT EXPIRATION DATE: 201910112359
Blood Product Expiration Date: 201910142359
UNIT TYPE AND RH: 6200
Unit Type and Rh: 6200

## 2018-10-29 ENCOUNTER — Other Ambulatory Visit: Payer: Self-pay | Admitting: Internal Medicine

## 2018-10-29 DIAGNOSIS — R05 Cough: Secondary | ICD-10-CM

## 2018-10-29 DIAGNOSIS — R053 Chronic cough: Secondary | ICD-10-CM

## 2018-10-29 DIAGNOSIS — R0600 Dyspnea, unspecified: Secondary | ICD-10-CM

## 2018-11-03 ENCOUNTER — Ambulatory Visit
Admission: RE | Admit: 2018-11-03 | Discharge: 2018-11-03 | Disposition: A | Discharge status: Home / Self Care | Payer: Medicare Other | Source: Ambulatory Visit | Attending: Internal Medicine | Admitting: Internal Medicine

## 2018-11-03 DIAGNOSIS — R05 Cough: Secondary | ICD-10-CM

## 2018-11-03 DIAGNOSIS — R911 Solitary pulmonary nodule: Secondary | ICD-10-CM | POA: Insufficient documentation

## 2018-11-03 DIAGNOSIS — I2510 Atherosclerotic heart disease of native coronary artery without angina pectoris: Secondary | ICD-10-CM | POA: Insufficient documentation

## 2018-11-03 DIAGNOSIS — R0600 Dyspnea, unspecified: Secondary | ICD-10-CM

## 2018-11-03 DIAGNOSIS — R053 Chronic cough: Secondary | ICD-10-CM

## 2018-11-03 DIAGNOSIS — I517 Cardiomegaly: Secondary | ICD-10-CM | POA: Insufficient documentation

## 2018-11-10 ENCOUNTER — Ambulatory Visit
Admission: RE | Admit: 2018-11-10 | Discharge: 2018-11-10 | Disposition: A | Discharge status: Home / Self Care | Payer: Self-pay | Source: Ambulatory Visit | Attending: Cardiothoracic Surgery | Admitting: Cardiothoracic Surgery

## 2018-11-10 ENCOUNTER — Other Ambulatory Visit: Payer: Self-pay | Admitting: Cardiothoracic Surgery

## 2018-11-10 DIAGNOSIS — C3490 Malignant neoplasm of unspecified part of unspecified bronchus or lung: Secondary | ICD-10-CM

## 2018-11-12 ENCOUNTER — Ambulatory Visit (INDEPENDENT_AMBULATORY_CARE_PROVIDER_SITE_OTHER): Payer: Medicare Other | Admitting: Cardiothoracic Surgery

## 2018-11-12 ENCOUNTER — Other Ambulatory Visit: Payer: Self-pay

## 2018-11-12 ENCOUNTER — Encounter: Payer: Self-pay | Admitting: Cardiothoracic Surgery

## 2018-11-12 VITALS — BP 106/74 | HR 60 | Temp 97.9°F | Resp 18 | Ht 64.5 in | Wt 222.2 lb

## 2018-11-12 DIAGNOSIS — R911 Solitary pulmonary nodule: Secondary | ICD-10-CM

## 2018-11-12 DIAGNOSIS — R918 Other nonspecific abnormal finding of lung field: Secondary | ICD-10-CM | POA: Diagnosis not present

## 2018-11-12 NOTE — Progress Notes (Signed)
Patient ID: Hailey Hawkins, female   DOB: November 13, 1946, 72 y.o.   MRN: [id number]  Chief Complaint  Patient presents with  . New Patient (Initial Visit)    Lung nodule     Referred By Dr. Fulton Reek Reason for Referral right upper lobe mass  HPI Location, Quality, Duration, Severity, Timing, Context, Modifying Factors, Associated Signs and Symptoms.  Hailey Hawkins is a 72 y.o. female.  Her problems began several months ago when she experienced increasing cough associated with shortness of breath.  Multiple chest x-rays were performed which did not reveal any etiology for her cough.  She then had a chest CT scan done and this revealed a 11 mm right upper lobe nodule which had grown 2 mm in size from 2012.  The nodule was considered indeterminant but was of concern for a slow-growing adenocarcinoma of the lung.  Patient is subsequently referred here to discuss work-up and treatment options.  She states that she is unable to perform the activities of daily living without significant shortness of breath.  She no longer goes out much and spends most of her days at home.  She is unable to walk up a flight of stairs.  She is unable to walk almost any steps at all.  She has had about a 30 pound weight loss over the last 6 months.  She is a lifelong non-smoker.  She attributes her weight loss to a poor appetite and the fact that she no longer cooks because she is too tired and weak to do that.  She does have a history of breast carcinoma.  She was treated with lumpectomy and radiation therapy about 3 or 4 years ago.  She continues to see Dr. Mike Gip for her breast carcinoma.   Past Medical History:  Diagnosis Date  . Anemia    pernicious  . Asthma   . Back pain   . Breast cancer ([place]) 2015   right c lumpectomy c radiation  . Breast injury Few Weeks ago   Patient fell a few weeks ago -- Rt breast may be tender when compressed  . DCIS (ductal carcinoma in situ) of breast   . Diabetes mellitus  without complication ([place])   . Elevated cholesterol   . GERD (gastroesophageal reflux disease)   . Hashimoto's disease   . Hypertension   . Hypothyroidism   . MRSA infection   . Obesity goither  . SIADH (syndrome of inappropriate ADH production) ([place])   . SOB (shortness of breath)   . Tachycardia     Past Surgical History:  Procedure Laterality Date  . ABDOMINAL HYSTERECTOMY    . BREAST BIOPSY Right 2014  . BREAST LUMPECTOMY Right 2015   f/u with radiation   . CESAREAN SECTION     x2  . colononoscopy    . COLONOSCOPY N/A 08/14/2016   Procedure: COLONOSCOPY;  Surgeon: Manya Silvas, MD;  Location: [facility] ENDOSCOPY;  Service: Endoscopy;  Laterality: N/A;  . COLONOSCOPY WITH ESOPHAGOGASTRODUODENOSCOPY (EGD)    . ESOPHAGOGASTRODUODENOSCOPY (EGD) WITH PROPOFOL N/A 08/14/2016   Procedure: ESOPHAGOGASTRODUODENOSCOPY (EGD) WITH PROPOFOL;  Surgeon: Manya Silvas, MD;  Location: [facility] ENDOSCOPY;  Service: Endoscopy;  Laterality: N/A;  . MASTECTOMY    . MASTECTOMY, PARTIAL Right   . REPLACEMENT TOTAL KNEE Right   . REPLACEMENT TOTAL KNEE Left   . THYROIDECTOMY, PARTIAL     caused throat paralysis  right side  . TONSILLECTOMY      Family History  Problem Relation  Age of Onset  . Cancer Daughter        colon cancer; age 51  . Breast cancer Neg Hx     Social History Social History   Tobacco Use  . Smoking status: Never Smoker  . Smokeless tobacco: Never Used  Substance Use Topics  . Alcohol use: No    Alcohol/week: 0.0 standard drinks  . Drug use: No    Allergies  Allergen Reactions  . Methamphetamine Shortness Of Breath    Difficulty breathing (INHALER)  . Amoxicillin     Other reaction(s): Diarrhea and vomiting (finding)  . Antihistamines, Chlorpheniramine-Type     Other reaction(s): Other (See Comments) Severe dryness  . Ciprofloxacin     Other reaction(s): Distress (finding)  . Codeine     Other reaction(s): Diarrhea and vomiting (finding), Weal or any  derivitive  . Other Other (See Comments)    Birth control pills - blood clots Birth control pills - blood clots  . Penicillin G     Other reaction(s): Weal  . Meloxicam Palpitations    Current Outpatient Medications  Medication Sig Dispense Refill  . acetaminophen (TYLENOL) 500 MG tablet Take 1,000 mg by mouth every 6 (six) hours as needed for mild pain.    Marland Kitchen albuterol (PROVENTIL HFA;VENTOLIN HFA) 108 (90 BASE) MCG/ACT inhaler Inhale 2 puffs into the lungs every 4 (four) hours as needed for wheezing or shortness of breath.    . ALPRAZolam (XANAX) 0.5 MG tablet Take 0.5 mg by mouth daily.    . benazepril (LOTENSIN) 40 MG tablet     . cloNIDine (CATAPRES) 0.2 MG tablet Take by mouth.    . cyanocobalamin 1000 MCG tablet Inject 1,000 mcg into the muscle every 30 (thirty) days.    Marland Kitchen dextromethorphan-guaiFENesin (MUCINEX DM) 30-600 MG per 12 hr tablet Take 1 tablet by mouth 2 (two) times daily as needed for cough.    . diphenhydramine-acetaminophen (TYLENOL PM) 25-500 MG TABS Take 2 tablets by mouth at bedtime as needed.    . docusate sodium (COLACE) 100 MG capsule Take 100 mg by mouth 2 (two) times daily.    Marland Kitchen docusate sodium (COLACE) 100 MG capsule Take 2 capsules (200 mg total) by mouth 2 (two) times daily. 120 capsule 0  . ferrous sulfate 325 (65 FE) MG tablet Take 325 mg by mouth 2 (two) times daily with a meal.    . Fluticasone-Salmeterol (ADVAIR) 250-50 MCG/DOSE AEPB Inhale 1 puff into the lungs 2 (two) times daily.    . hydrALAZINE (APRESOLINE) 100 MG tablet     . ibandronate (BONIVA) 150 MG tablet     . insulin glargine (LANTUS) 100 UNIT/ML injection Inject 30 Units into the skin at bedtime.    . Insulin Syringe-Needle U-100 (INSULIN SYRINGE .5CC/31GX5/16") 31G X 5/16" 0.5 ML MISC Use 1 syringe daily for insulin injections    . levothyroxine (SYNTHROID, LEVOTHROID) 100 MCG tablet Take 100 mcg by mouth daily before breakfast.    . magnesium oxide (MAG-OX) 400 MG tablet     . metFORMIN  (GLUCOPHAGE) 1000 MG tablet Take 1,000 mg by mouth 2 (two) times daily with a meal.    . metoprolol succinate (TOPROL-XL) 50 MG 24 hr tablet Take 50 mg by mouth daily. Take with or immediately following a meal.    . omeprazole (PRILOSEC OTC) 20 MG tablet Take 20 mg by mouth 2 (two) times daily.    Marland Kitchen omeprazole (PRILOSEC) 20 MG capsule     . OXYGEN Inhale  2 L into the lungs at bedtime.    . pramoxine (PROCTOFOAM) 1 % foam Place 1 application rectally 3 (three) times daily as needed for hemorrhoids. 15 g 0  . sucralfate (CARAFATE) 1 G tablet Take 1 g by mouth 4 (four) times daily.     . traZODone (DESYREL) 50 MG tablet     . Witch Hazel (TUCKS) 50 % PADS Apply 1 application topically every 2 (two) hours as needed. 40 each 2  . atorvastatin (LIPITOR) 10 MG tablet Take by mouth.     No current facility-administered medications for this visit.       Review of Systems A complete review of systems was asked and was negative except for the following positive findings history of aortic stenosis and mitral regurgitation, bronchitis, asthma, shortness of breath, diabetes, thyroid disease, goiter, high cholesterol, easy bruising, dry skin, constipation, reflux, hemorrhoids, anemia, hearing loss, sinus problems, nosebleeds, glasses.  Blood pressure 106/74, pulse 60, temperature 97.9 F (36.6 C), resp. rate 18, height 5' 4.5" (1.638 m), weight 222 lb 3.2 oz (100.8 kg), SpO2 99 %.  Physical Exam CONSTITUTIONAL:  Pleasant, obese, and in no acute distress. EYES: Pupils equal and reactive to light, Sclera non-icteric EARS, NOSE, MOUTH AND THROAT:  The oropharynx was clear.  Dentition is good repair.  Oral mucosa pink and moist. LYMPH NODES:  Lymph nodes in the neck and axillae were normal RESPIRATORY:  Lungs were clear.  Normal respiratory effort without pathologic use of accessory muscles of respiration CARDIOVASCULAR: Heart was regular with loud systolic murmurs.  There were no carotid bruits. GI: The  abdomen was soft, nontender, and nondistended. There were no palpable masses. There was no hepatosplenomegaly. There were normal bowel sounds in all quadrants. GU:  Rectal deferred.   MUSCULOSKELETAL:  Normal muscle strength and tone.  No clubbing or cyanosis.   SKIN:  There were no pathologic skin lesions.  There were no nodules on palpation. NEUROLOGIC:  Sensation is normal.  Cranial nerves are grossly intact. PSYCH:  Oriented to person, place and time.  Mood and affect are normal.  Data Reviewed CT scan from 2012 in 2019  I have personally reviewed the patient's imaging, laboratory findings and medical records.    Assessment    Right upper lobe nodule indeterminant in nature but could be a slow-growing adenocarcinoma.    Plan    I explained to the patient that the nodule in question has increased 2 mm over the last 7 years.  I also explained that surgical intervention for carcinoma of the lung has the best long-term prognosis.  However she is severely deconditioned and surgical intervention would carry significant morbidity.  I would therefore like to ask Dr. Mike Gip Dr. Donella Stade to give [place] their opinion regarding treatments and or diagnostic strategies.  I did not make return visit for her but would be happy to see her in the future.  She expressed interest in physical rehabilitation and weight loss reduction.  She states that she will speak to Dr. Doy Hutching about that.       Nestor Lewandowsky, MD 11/12/2018, 9:04 AM

## 2018-11-12 NOTE — Patient Instructions (Signed)
We will send the referral to Radiation Oncology Dr.Crtstal. Someone form their office will call and schedule an appointment. If you do not hear from their office within 5 days please call our office and let us know.   Please call our office if you have questions or concerns.

## 2018-11-16 DIAGNOSIS — I2510 Atherosclerotic heart disease of native coronary artery without angina pectoris: Secondary | ICD-10-CM | POA: Insufficient documentation

## 2018-11-17 ENCOUNTER — Other Ambulatory Visit: Payer: Self-pay | Admitting: *Deleted

## 2018-11-17 ENCOUNTER — Other Ambulatory Visit: Payer: Self-pay

## 2018-11-17 ENCOUNTER — Encounter: Payer: Self-pay | Admitting: Radiation Oncology

## 2018-11-17 ENCOUNTER — Ambulatory Visit
Admission: RE | Admit: 2018-11-17 | Discharge: 2018-11-17 | Disposition: A | Discharge status: Home / Self Care | Payer: Medicare Other | Source: Ambulatory Visit | Attending: Radiation Oncology | Admitting: Radiation Oncology

## 2018-11-17 VITALS — BP 140/78 | HR 68 | Temp 97.9°F | Resp 18 | Wt 222.9 lb

## 2018-11-17 DIAGNOSIS — K219 Gastro-esophageal reflux disease without esophagitis: Secondary | ICD-10-CM | POA: Insufficient documentation

## 2018-11-17 DIAGNOSIS — I10 Essential (primary) hypertension: Secondary | ICD-10-CM | POA: Insufficient documentation

## 2018-11-17 DIAGNOSIS — J45909 Unspecified asthma, uncomplicated: Secondary | ICD-10-CM | POA: Insufficient documentation

## 2018-11-17 DIAGNOSIS — Z794 Long term (current) use of insulin: Secondary | ICD-10-CM | POA: Insufficient documentation

## 2018-11-17 DIAGNOSIS — M549 Dorsalgia, unspecified: Secondary | ICD-10-CM | POA: Diagnosis not present

## 2018-11-17 DIAGNOSIS — E119 Type 2 diabetes mellitus without complications: Secondary | ICD-10-CM | POA: Insufficient documentation

## 2018-11-17 DIAGNOSIS — R918 Other nonspecific abnormal finding of lung field: Secondary | ICD-10-CM | POA: Insufficient documentation

## 2018-11-17 DIAGNOSIS — E063 Autoimmune thyroiditis: Secondary | ICD-10-CM | POA: Diagnosis not present

## 2018-11-17 DIAGNOSIS — Z79899 Other long term (current) drug therapy: Secondary | ICD-10-CM | POA: Insufficient documentation

## 2018-11-17 DIAGNOSIS — Z800 Family history of malignant neoplasm of digestive organs: Secondary | ICD-10-CM | POA: Diagnosis not present

## 2018-11-17 DIAGNOSIS — Z86000 Personal history of in-situ neoplasm of breast: Secondary | ICD-10-CM | POA: Diagnosis not present

## 2018-11-17 DIAGNOSIS — Z9011 Acquired absence of right breast and nipple: Secondary | ICD-10-CM | POA: Insufficient documentation

## 2018-11-17 DIAGNOSIS — Z90710 Acquired absence of both cervix and uterus: Secondary | ICD-10-CM | POA: Insufficient documentation

## 2018-11-17 DIAGNOSIS — R000 Tachycardia, unspecified: Secondary | ICD-10-CM | POA: Insufficient documentation

## 2018-11-17 DIAGNOSIS — E669 Obesity, unspecified: Secondary | ICD-10-CM | POA: Insufficient documentation

## 2018-11-17 DIAGNOSIS — E222 Syndrome of inappropriate secretion of antidiuretic hormone: Secondary | ICD-10-CM | POA: Diagnosis not present

## 2018-11-17 DIAGNOSIS — E7800 Pure hypercholesterolemia, unspecified: Secondary | ICD-10-CM | POA: Diagnosis not present

## 2018-11-17 DIAGNOSIS — E039 Hypothyroidism, unspecified: Secondary | ICD-10-CM | POA: Diagnosis not present

## 2018-11-17 NOTE — Consult Note (Signed)
NEW PATIENT EVALUATION  Name: Hailey Hawkins  MRN: [id number]  Date:   11/17/2018     DOB: 06-24-1946   This 72 y.o. female patient presents to the clinic for initial evaluation of right upper lobe nodule.  REFERRING PHYSICIAN: Idelle Crouch, MD  CHIEF COMPLAINT:  Chief Complaint  Patient presents with  . Lung Cancer    Pt is here for initial evaluation of lung nodule     DIAGNOSIS: The encounter diagnosis was Mass of upper lobe of right lung.   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 Radiologically was concerning for slow-growing adenocarcinoma. Some of her fatigue and shortness of breath has been traced to cardiac issues which are being worked up at this time by her cardiologist. She specifically denies hemoptysis or chest tightness.mammograms and breast follow-up have shown no evidence of disease.  PLANNED TREATMENT REGIMEN: observation  PAST MEDICAL HISTORY:  has a past medical history of Anemia, Asthma, Back pain, Breast cancer ([place]) (2015), Breast injury (Few Weeks ago), DCIS (ductal carcinoma in situ) of breast, Diabetes mellitus without complication ([place]), Elevated cholesterol, GERD (gastroesophageal reflux disease), Hashimoto's disease, Hypertension, Hypothyroidism, MRSA infection, Obesity ([place]), SIADH (syndrome of inappropriate ADH production) ([place]), SOB (shortness of breath), and Tachycardia.    PAST SURGICAL HISTORY:  Past Surgical History:  Procedure Laterality Date  . ABDOMINAL HYSTERECTOMY    . BREAST BIOPSY Right 2014  . BREAST  LUMPECTOMY Right 2015   f/u with radiation   . CESAREAN SECTION     x2  . colononoscopy    . COLONOSCOPY N/A 08/14/2016   Procedure: COLONOSCOPY;  Surgeon: Manya Silvas, MD;  Location: [facility] ENDOSCOPY;  Service: Endoscopy;  Laterality: N/A;  . COLONOSCOPY WITH ESOPHAGOGASTRODUODENOSCOPY (EGD)    . ESOPHAGOGASTRODUODENOSCOPY (EGD) WITH PROPOFOL N/A 08/14/2016   Procedure: ESOPHAGOGASTRODUODENOSCOPY (EGD) WITH PROPOFOL;  Surgeon: Manya Silvas, MD;  Location: [facility] ENDOSCOPY;  Service: Endoscopy;  Laterality: N/A;  . MASTECTOMY    . MASTECTOMY, PARTIAL Right   . REPLACEMENT TOTAL KNEE Right   . REPLACEMENT TOTAL KNEE Left   . THYROIDECTOMY, PARTIAL     caused throat paralysis  right side  . TONSILLECTOMY      FAMILY HISTORY: family history includes Cancer in her daughter.  SOCIAL HISTORY:  reports that she has never smoked. She has never used smokeless tobacco. She reports that she does not drink alcohol or use drugs.  ALLERGIES: Methamphetamine; Amoxicillin; Antihistamines, chlorpheniramine-type; Ciprofloxacin; Codeine; Other; Penicillin g; and Meloxicam  MEDICATIONS:  Current Outpatient Medications  Medication Sig Dispense Refill  . acetaminophen (TYLENOL) 500 MG tablet Take 1,000 mg by mouth every 6 (six) hours as needed for mild pain.    Marland Kitchen albuterol (PROVENTIL HFA;VENTOLIN HFA) 108 (90 BASE) MCG/ACT inhaler Inhale 2 puffs into the lungs every 4 (four) hours as needed for wheezing or shortness of breath.    . ALPRAZolam (XANAX) 0.5 MG tablet Take 0.5 mg by mouth daily.    Marland Kitchen atorvastatin (LIPITOR) 10 MG tablet Take by mouth.    . benazepril (LOTENSIN) 40 MG tablet     .  cloNIDine (CATAPRES) 0.2 MG tablet Take by mouth.    . cyanocobalamin 1000 MCG tablet Inject 1,000 mcg into the muscle every 30 (thirty) days.    Marland Kitchen dextromethorphan-guaiFENesin (MUCINEX DM) 30-600 MG per 12 hr tablet Take 1 tablet by mouth 2 (two) times daily as needed for cough.    .  diphenhydramine-acetaminophen (TYLENOL PM) 25-500 MG TABS Take 2 tablets by mouth at bedtime as needed.    . docusate sodium (COLACE) 100 MG capsule Take 100 mg by mouth 2 (two) times daily.    Marland Kitchen docusate sodium (COLACE) 100 MG capsule Take 2 capsules (200 mg total) by mouth 2 (two) times daily. 120 capsule 0  . ferrous sulfate 325 (65 FE) MG tablet Take 325 mg by mouth 2 (two) times daily with a meal.    . Fluticasone-Salmeterol (ADVAIR) 250-50 MCG/DOSE AEPB Inhale 1 puff into the lungs 2 (two) times daily.    . hydrALAZINE (APRESOLINE) 100 MG tablet     . ibandronate (BONIVA) 150 MG tablet     . insulin glargine (LANTUS) 100 UNIT/ML injection Inject 30 Units into the skin at bedtime.    . Insulin Syringe-Needle U-100 (INSULIN SYRINGE .5CC/31GX5/16") 31G X 5/16" 0.5 ML MISC Use 1 syringe daily for insulin injections    . levothyroxine (SYNTHROID, LEVOTHROID) 100 MCG tablet Take 100 mcg by mouth daily before breakfast.    . magnesium oxide (MAG-OX) 400 MG tablet     . metFORMIN (GLUCOPHAGE) 1000 MG tablet Take 1,000 mg by mouth 2 (two) times daily with a meal.    . metoprolol succinate (TOPROL-XL) 50 MG 24 hr tablet Take 50 mg by mouth daily. Take with or immediately following a meal.    . omeprazole (PRILOSEC OTC) 20 MG tablet Take 20 mg by mouth 2 (two) times daily.    Marland Kitchen omeprazole (PRILOSEC) 20 MG capsule     . OXYGEN Inhale 2 L into the lungs at bedtime.    . pramoxine (PROCTOFOAM) 1 % foam Place 1 application rectally 3 (three) times daily as needed for hemorrhoids. 15 g 0  . sucralfate (CARAFATE) 1 G tablet Take 1 g by mouth 4 (four) times daily.     . traZODone (DESYREL) 50 MG tablet     . Witch Hazel (TUCKS) 50 % PADS Apply 1 application topically every 2 (two) hours as needed. 40 each 2   No current facility-administered medications for this encounter.     ECOG PERFORMANCE STATUS:  0 - Asymptomatic  REVIEW OF SYSTEMS:  Patient denies any weight loss, fatigue, weakness, fever,  chills or night sweats. Patient denies any loss of vision, blurred vision. Patient denies any ringing  of the ears or hearing loss. No irregular heartbeat. Patient denies heart murmur or history of fainting. Patient denies any chest pain or pain radiating to her upper extremities. Patient denies any shortness of breath, difficulty breathing at night, cough or hemoptysis. Patient denies any swelling in the lower legs. Patient denies any nausea vomiting, vomiting of blood, or coffee ground material in the vomitus. Patient denies any stomach pain. Patient states has had normal bowel movements no significant constipation or diarrhea. Patient denies any dysuria, hematuria or significant nocturia. Patient denies any problems walking, swelling in the joints or loss of balance. Patient denies any skin changes, loss of hair or loss of weight. Patient denies any excessive worrying or anxiety or significant depression. Patient denies any problems with insomnia. Patient denies excessive thirst, polyuria, polydipsia. Patient denies any swollen  glands, patient denies easy bruising or easy bleeding. Patient denies any recent infections, allergies or URI. Patient "s visual fields have not changed significantly in recent time.    PHYSICAL EXAM: BP 140/78 (Patient Position: Sitting)   Pulse 68   Temp 97.9 F (36.6 C) (Tympanic)   Resp 18   Wt 222 lb 14.2 oz (101.1 kg)   BMI 37.67 kg/m  Except for the fatigue cough and chest tightness all possible probably attributed to her cardiac diseaseWell-developed well-nourished patient in NAD. HEENT reveals PERLA, EOMI, discs not visualized.  Oral cavity is clear. No oral mucosal lesions are identified. Neck is clear without evidence of cervical or supraclavicular adenopathy. Lungs are clear to A&P. Cardiac examination is essentially unremarkable with regular rate and rhythm without murmur rub or thrill. Abdomen is benign with no organomegaly or masses noted. Motor sensory and DTR  levels are equal and symmetric in the upper and lower extremities. Cranial nerves II through XII are grossly intact. Proprioception is intact. No peripheral adenopathy or edema is identified. No motor or sensory levels are noted. Crude visual fields are within normal range.  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 I believe her cardiac issues are more paramount importance at this point time. She does think progress at any time we could offer SB RT in 5 fractions to this lesion. Patient and her whole family comprehend my treatment plan well. I set up a follow-up appointment in 6 months and a CT scan at that time prior to her visit.  I would like to take this opportunity to thank you for allowing me to participate in the care of your patient.Noreene Filbert, MD

## 2018-11-18 ENCOUNTER — Ambulatory Visit: Payer: Medicare Other | Admitting: Radiation Oncology

## 2018-12-07 DIAGNOSIS — R9389 Abnormal findings on diagnostic imaging of other specified body structures: Secondary | ICD-10-CM | POA: Insufficient documentation

## 2018-12-07 DIAGNOSIS — R5382 Chronic fatigue, unspecified: Secondary | ICD-10-CM | POA: Insufficient documentation

## 2018-12-07 DIAGNOSIS — R768 Other specified abnormal immunological findings in serum: Secondary | ICD-10-CM | POA: Insufficient documentation

## 2018-12-07 DIAGNOSIS — R0609 Other forms of dyspnea: Secondary | ICD-10-CM | POA: Insufficient documentation

## 2018-12-31 ENCOUNTER — Other Ambulatory Visit: Payer: Medicare Other

## 2019-01-05 ENCOUNTER — Ambulatory Visit
Admission: RE | Admit: 2019-01-05 | Discharge: 2019-01-05 | Disposition: A | Discharge status: Home / Self Care | Payer: Medicare Other | Source: Ambulatory Visit | Attending: Urgent Care | Admitting: Urgent Care

## 2019-01-05 DIAGNOSIS — D0511 Intraductal carcinoma in situ of right breast: Secondary | ICD-10-CM | POA: Insufficient documentation

## 2019-01-05 HISTORY — DX: Personal history of irradiation: Z92.3

## 2019-01-13 ENCOUNTER — Other Ambulatory Visit: Payer: Medicare Other

## 2019-01-13 ENCOUNTER — Ambulatory Visit: Payer: Medicare Other | Admitting: Hematology and Oncology

## 2019-01-13 ENCOUNTER — Inpatient Hospital Stay: Payer: Medicare Other | Attending: Hematology and Oncology

## 2019-01-13 ENCOUNTER — Inpatient Hospital Stay (HOSPITAL_BASED_OUTPATIENT_CLINIC_OR_DEPARTMENT_OTHER): Payer: Medicare Other | Admitting: Hematology and Oncology

## 2019-01-13 VITALS — BP 123/80 | HR 69 | Temp 98.2°F | Resp 20 | Wt 215.9 lb

## 2019-01-13 DIAGNOSIS — I10 Essential (primary) hypertension: Secondary | ICD-10-CM

## 2019-01-13 DIAGNOSIS — Z86000 Personal history of in-situ neoplasm of breast: Secondary | ICD-10-CM | POA: Diagnosis present

## 2019-01-13 DIAGNOSIS — E119 Type 2 diabetes mellitus without complications: Secondary | ICD-10-CM | POA: Insufficient documentation

## 2019-01-13 DIAGNOSIS — R001 Bradycardia, unspecified: Secondary | ICD-10-CM | POA: Insufficient documentation

## 2019-01-13 DIAGNOSIS — Z800 Family history of malignant neoplasm of digestive organs: Secondary | ICD-10-CM

## 2019-01-13 DIAGNOSIS — Z923 Personal history of irradiation: Secondary | ICD-10-CM | POA: Insufficient documentation

## 2019-01-13 DIAGNOSIS — R634 Abnormal weight loss: Secondary | ICD-10-CM

## 2019-01-13 DIAGNOSIS — D0511 Intraductal carcinoma in situ of right breast: Secondary | ICD-10-CM

## 2019-01-13 DIAGNOSIS — E222 Syndrome of inappropriate secretion of antidiuretic hormone: Secondary | ICD-10-CM

## 2019-01-13 DIAGNOSIS — E871 Hypo-osmolality and hyponatremia: Secondary | ICD-10-CM | POA: Insufficient documentation

## 2019-01-13 DIAGNOSIS — D509 Iron deficiency anemia, unspecified: Secondary | ICD-10-CM | POA: Diagnosis not present

## 2019-01-13 DIAGNOSIS — R5383 Other fatigue: Secondary | ICD-10-CM

## 2019-01-13 DIAGNOSIS — R911 Solitary pulmonary nodule: Secondary | ICD-10-CM

## 2019-01-13 LAB — CBC WITH DIFFERENTIAL/PLATELET
ABS IMMATURE GRANULOCYTES: 0.02 10*3/uL (ref 0.00–0.07)
Basophils Absolute: 0.1 10*3/uL (ref 0.0–0.1)
Basophils Relative: 1 %
Eosinophils Absolute: 0.1 10*3/uL (ref 0.0–0.5)
Eosinophils Relative: 2 %
HEMATOCRIT: 35.8 % — AB (ref 36.0–46.0)
Hemoglobin: 11.9 g/dL — ABNORMAL LOW (ref 12.0–15.0)
IMMATURE GRANULOCYTES: 0 %
LYMPHS ABS: 1.6 10*3/uL (ref 0.7–4.0)
LYMPHS PCT: 26 %
MCH: 28.5 pg (ref 26.0–34.0)
MCHC: 33.2 g/dL (ref 30.0–36.0)
MCV: 85.6 fL (ref 80.0–100.0)
MONOS PCT: 6 %
Monocytes Absolute: 0.4 10*3/uL (ref 0.1–1.0)
NEUTROS ABS: 4 10*3/uL (ref 1.7–7.7)
NEUTROS PCT: 65 %
PLATELETS: 277 10*3/uL (ref 150–400)
RBC: 4.18 MIL/uL (ref 3.87–5.11)
RDW: 12 % (ref 11.5–15.5)
WBC: 6.2 10*3/uL (ref 4.0–10.5)
nRBC: 0 % (ref 0.0–0.2)

## 2019-01-13 LAB — COMPREHENSIVE METABOLIC PANEL
ALT: 18 U/L (ref 0–44)
ANION GAP: 11 (ref 5–15)
AST: 36 U/L (ref 15–41)
Albumin: 3.7 g/dL (ref 3.5–5.0)
Alkaline Phosphatase: 55 U/L (ref 38–126)
BUN: 11 mg/dL (ref 8–23)
CHLORIDE: 98 mmol/L (ref 98–111)
CO2: 23 mmol/L (ref 22–32)
Calcium: 8.8 mg/dL — ABNORMAL LOW (ref 8.9–10.3)
Creatinine, Ser: 0.61 mg/dL (ref 0.44–1.00)
GFR calc Af Amer: >60 mL/min (ref >60)
GFR calc non Af Amer: >60 mL/min (ref >60)
GLUCOSE: 174 mg/dL — AB (ref 70–99)
POTASSIUM: 4.1 mmol/L (ref 3.5–5.1)
SODIUM: 132 mmol/L — AB (ref 135–145)
Total Bilirubin: 0.6 mg/dL (ref 0.3–1.2)
Total Protein: 7.9 g/dL (ref 6.5–8.1)

## 2019-01-13 LAB — FERRITIN: Ferritin: 91 ng/mL (ref 11–307)

## 2019-01-13 LAB — IRON AND TIBC
Iron: 71 ug/dL (ref 28–170)
SATURATION RATIOS: 25 % (ref 10.4–31.8)
TIBC: 283 ug/dL (ref 250–450)
UIBC: 212 ug/dL

## 2019-01-13 NOTE — Progress Notes (Signed)
Pt here for follow up. 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 States that are not "officially DX me with this yet". Referred to Dr. Barb Merino at [facility]. Appt. Scheduled for early March. Denies any other concerns at this time.

## 2019-01-13 NOTE — Progress Notes (Signed)
[facility] day:   01/13/19   Chief Complaint: Hailey Hawkins is a 73 y.o. 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  HPI:  The patient was last seen in the medical oncology clinic on 07/15/2018.  At that time, patient was fatigued.  She complained of decreased strength in her lower extremities.  Blood pressure was under better control on 4 antihypertensives.  She presented to the clinic bradycardic at a rate of 40 (asymptomatic).  No B symptoms or breast concerns.  Eating well; weight down 6 pounds.  Exam revealed chronic tender fibrocystic changes.  Sodium was low at 131.  The patient was seen in the ED on 09/14/2018 by Dr. Brenton Grills.  Notes reviewed.  Patient with complaints of hematochezia.  Vital signs were stable.  Hemoglobin 11.9, hematocrit 34.5, MCV 85.2, and platelets 290,000.  Sodium low at 133 mmol/L.  Low concern for gastrointestinal bleeding.  Bleeding felt to be hemorrhoidal in nature. Patient prescribed docusate 100 mg twice daily, Tucks pads every 2 hours, and pramoxine 3 times daily PRN.  Patient discharged home in stable condition.  CT imaging of the chest was performed on 11/03/2018 revealing an 11 mm groundglass opacity in the peripheral RUL mildly increased in size since 2012.  Indolent pulmonary adenocarcinoma could not be excluded.  Gastrohepatic ligament and retrocrural lymph nodes appear increased in size since CT imaging in 2012, but remained normal by size criteria.  Patient was seen in consult on 11/12/2018 by Dr. Nestor Lewandowsky.  Notes reviewed.  Patient with complaints of increased cough and a 30 pound weight loss over the course of 6 months.  Due to deconditioned state, surgical intervention was felt to carry significant risk of morbidity.  Treatment options deferred to medical and radiation oncology.  Patient was seen in consult on 11/17/2018 by Dr. Noreene Filbert  (radiation oncology).  CT imaging reviewed.  Due to the minuscule growth of the lesion, coupled with the lack of solid pattern, the decision was made to repeat CT imaging of the chest in 6 months.  If the lesion progressed, radiation oncologist discussed offering SBRT in 5 fractions to the RUL lesion.  Repeat CT imaging scheduled for 05/11/2019.  Routine mammogram done on 01/05/2019 revealed no mammographic evidence of malignancy in either breast.  During the interim, patient has been doing well overall. She denies any acute concerns. Blood pressure has improved. Patient continues to have low energy and exertional shortness of breath. She requires supplemental oxygen at night; denies orthopnea. She is currently being worked up for [employer] granulomatosis.  She is scheduled to be seen in consult by Dr. Meda Coffee.   Patient denies that she has experienced any fevers or sweats. No nausea, vomiting, or changes to her bowel habits. She denies any interval infections.  Patient does not verbalize any concerns with regards to her breasts today. Patient does not perform monthly self breast examinations as recommended citing that "there are so many lumps in there that she would not know what she was feeling".   Patient advises that she maintains a labile appetite. She is not eating that well. Weight today is 215 lb 15.1 oz (98 kg), which compared to her last visit to the clinic, represents a 21 pound decrease.  Patient denies pain in the clinic today.   Past Medical History:  Diagnosis Date  . Anemia    pernicious  . Asthma   .  Back pain   . Breast cancer ([facility]) 2015   right c lumpectomy c radiation  . Breast injury Few Weeks ago   Patient fell a few weeks ago -- Rt breast may be tender when compressed  . DCIS (ductal carcinoma in situ) of breast   . Diabetes mellitus without complication ([place])   . Elevated cholesterol   . GERD (gastroesophageal reflux disease)   . Hashimoto's disease   . Hypertension    . Hypothyroidism   . MRSA infection   . Obesity goither  . Personal history of radiation therapy 2015   right breast DCIS  . SIADH (syndrome of inappropriate ADH production) ([place])   . SOB (shortness of breath)   . Tachycardia     Past Surgical History:  Procedure Laterality Date  . ABDOMINAL HYSTERECTOMY    . BREAST BIOPSY Right 11/2013   DCIS  . BREAST LUMPECTOMY Right 2015   DCIS, clear margins  . CESAREAN SECTION     x2  . colononoscopy    . COLONOSCOPY N/A 08/14/2016   Procedure: COLONOSCOPY;  Surgeon: Manya Silvas, MD;  Location: [facility] ENDOSCOPY;  Service: Endoscopy;  Laterality: N/A;  . COLONOSCOPY WITH ESOPHAGOGASTRODUODENOSCOPY (EGD)    . ESOPHAGOGASTRODUODENOSCOPY (EGD) WITH PROPOFOL N/A 08/14/2016   Procedure: ESOPHAGOGASTRODUODENOSCOPY (EGD) WITH PROPOFOL;  Surgeon: Manya Silvas, MD;  Location: [facility] ENDOSCOPY;  Service: Endoscopy;  Laterality: N/A;  . MASTECTOMY    . MASTECTOMY, PARTIAL Right   . REPLACEMENT TOTAL KNEE Right   . REPLACEMENT TOTAL KNEE Left   . THYROIDECTOMY, PARTIAL     caused throat paralysis  right side  . TONSILLECTOMY      Family History  Problem Relation Age of Onset  . Cancer Daughter        colon cancer; age 15  . Breast cancer Neg Hx     Social History:  reports that she has never smoked. She has never used smokeless tobacco. She reports that she does not drink alcohol or use drugs.  The patient lives in [place].  The patient is alone today.  Allergies:  Allergies  Allergen Reactions  . Methamphetamine Shortness Of Breath    Difficulty breathing (INHALER)  . Amoxicillin     Other reaction(s): Diarrhea and vomiting (finding)  . Antihistamines, Chlorpheniramine-Type     Other reaction(s): Other (See Comments) Severe dryness  . Ciprofloxacin     Other reaction(s): Distress (finding)  . Codeine     Other reaction(s): Diarrhea and vomiting (finding), Weal or any derivitive  . Other Other (See Comments)    Birth  control pills - blood clots Birth control pills - blood clots  . Penicillin G     Other reaction(s): Weal  . Meloxicam Palpitations    Current Medications: Current Outpatient Medications  Medication Sig Dispense Refill  . acetaminophen (TYLENOL) 500 MG tablet Take 1,000 mg by mouth every 6 (six) hours as needed for mild pain.    Marland Kitchen ALPRAZolam (XANAX) 0.5 MG tablet Take 0.5 mg by mouth daily.    Marland Kitchen atorvastatin (LIPITOR) 10 MG tablet Take 10 mg by mouth daily.     . benazepril (LOTENSIN) 40 MG tablet 40 mg daily.     . cloNIDine (CATAPRES) 0.2 MG tablet Take 0.2 mg by mouth daily.     . cyanocobalamin 1000 MCG tablet Inject 1,000 mcg into the muscle every 30 (thirty) days.    Marland Kitchen dextromethorphan-guaiFENesin (MUCINEX DM) 30-600 MG per 12 hr tablet Take 1 tablet by mouth  2 (two) times daily as needed for cough.    . docusate sodium (COLACE) 100 MG capsule Take 100 mg by mouth 2 (two) times daily.    . hydrALAZINE (APRESOLINE) 100 MG tablet 50 mg 3 (three) times daily.     . insulin glargine (LANTUS) 100 UNIT/ML injection Inject 30 Units into the skin at bedtime.    . Insulin Syringe-Needle U-100 (INSULIN SYRINGE .5CC/31GX5/16") 31G X 5/16" 0.5 ML MISC Use 1 syringe daily for insulin injections    . levothyroxine (SYNTHROID, LEVOTHROID) 100 MCG tablet Take 100 mcg by mouth daily before breakfast.    . magnesium oxide (MAG-OX) 400 MG tablet Take 400 mg by mouth daily.     . metFORMIN (GLUCOPHAGE) 1000 MG tablet Take 1,000 mg by mouth 2 (two) times daily with a meal.    . metoprolol tartrate (LOPRESSOR) 50 MG tablet Take 50 mg by mouth 2 (two) times daily.    Marland Kitchen omeprazole (PRILOSEC OTC) 20 MG tablet Take 20 mg by mouth daily.     . OXYGEN Inhale 2 L into the lungs at bedtime.    . pramoxine (PROCTOFOAM) 1 % foam Place 1 application rectally 3 (three) times daily as needed for hemorrhoids. 15 g 0  . sucralfate (CARAFATE) 1 G tablet Take 1 g by mouth 4 (four) times daily.     Addison Lank Hazel (TUCKS)  50 % PADS Apply 1 application topically every 2 (two) hours as needed. 40 each 2  . albuterol (PROVENTIL HFA;VENTOLIN HFA) 108 (90 BASE) MCG/ACT inhaler Inhale 2 puffs into the lungs every 4 (four) hours as needed for wheezing or shortness of breath.    . ferrous sulfate 325 (65 FE) MG tablet Take 325 mg by mouth 2 (two) times daily with a meal.    . Fluticasone-Salmeterol (ADVAIR) 250-50 MCG/DOSE AEPB Inhale 1 puff into the lungs 2 (two) times daily.    Marland Kitchen ibandronate (BONIVA) 150 MG tablet     . traZODone (DESYREL) 50 MG tablet      No current facility-administered medications for this visit.     Review of Systems  Constitutional: Positive for malaise/fatigue and weight loss (21 pounds since 06/2018; 7 pounds since 10/2018). Negative for diaphoresis and fever.       Energy level is "poor".  HENT: Negative.   Eyes: Negative.   Respiratory: Positive for shortness of breath (with exertion). Negative for cough, hemoptysis and sputum production.        Nocturnal supplemental oxygen use.  Cardiovascular: Negative for chest pain, palpitations, orthopnea, leg swelling and PND.  Gastrointestinal: Negative for abdominal pain, blood in stool, constipation, diarrhea, melena, nausea and vomiting.       Poor appetite.  Colonic polyps.  Last EGD and colonoscopy in 07/2016.  Genitourinary: Negative for dysuria, frequency, hematuria and urgency.  Musculoskeletal: Negative for back pain, falls, joint pain and myalgias.  Skin: Negative for itching and rash.  Neurological: Negative for dizziness, tremors, weakness and headaches.  Endo/Heme/Allergies: Does not bruise/bleed easily.       Diabetes under good control.  Psychiatric/Behavioral: Negative for depression, memory loss and suicidal ideas. The patient is not nervous/anxious and does not have insomnia.   All other systems reviewed and are negative.  Performance status (ECOG): 1 - Symptomatic but completely ambulatory  Vital Signs BP 123/80 (BP  Location: Left Arm, Patient Position: Sitting)   Pulse 69   Temp 98.2 F (36.8 C) (Tympanic)   Resp 20   Wt 215 lb 15.1  oz (98 kg)   SpO2 99%   BMI 36.49 kg/m   Physical Exam  Constitutional: She is oriented to person, place, and time and well-developed, well-nourished, and in no distress. No distress.  HENT:  Head: Normocephalic and atraumatic.  Mouth/Throat: Oropharynx is clear and moist. No oropharyngeal exudate.  Short graying brown hair.  Eyes: Pupils are equal, round, and reactive to light. Conjunctivae and EOM are normal. No scleral icterus.  Blue eyes.  Neck: Normal range of motion. Neck supple. No JVD present.  Cardiovascular: Normal rate, regular rhythm and normal heart sounds. Exam reveals no gallop and no friction rub.  No murmur heard. Pulmonary/Chest: Effort normal and breath sounds normal. No respiratory distress. She has no wheezes. She has no rales. Right breast exhibits skin change (well healed medial periareolar incision). Right breast exhibits no nipple discharge and no tenderness. Left breast exhibits no nipple discharge and no skin change. Breasts are asymmetrical (tender bilaterally.  right > left fibrocystic changes).  Abdominal: Soft. Bowel sounds are normal. She exhibits no distension and no mass. There is no abdominal tenderness. There is no rebound and no guarding.  Musculoskeletal:        General: No tenderness or edema.     Comments: Chronic lower extremity changes (left > right).  Lymphadenopathy:    She has no cervical adenopathy.    She has no axillary adenopathy.       Right: No inguinal and no supraclavicular adenopathy present.       Left: No inguinal and no supraclavicular adenopathy present.  Neurological: She is alert and oriented to person, place, and time.  Skin: Skin is warm and dry. No rash noted. She is not diaphoretic. No erythema.  Psychiatric: Mood, affect and judgment normal.  Nursing note and vitals reviewed.   Appointment on  01/13/2019  Component Date Value Ref Range Status  . Sodium 01/13/2019 132* 135 - 145 mmol/L Final  . Potassium 01/13/2019 4.1  3.5 - 5.1 mmol/L Final  . Chloride 01/13/2019 98  98 - 111 mmol/L Final  . CO2 01/13/2019 23  22 - 32 mmol/L Final  . Glucose, Bld 01/13/2019 174* 70 - 99 mg/dL Final  . BUN 01/13/2019 11  8 - 23 mg/dL Final  . Creatinine, Ser 01/13/2019 0.61  0.44 - 1.00 mg/dL Final  . Calcium 01/13/2019 8.8* 8.9 - 10.3 mg/dL Final  . Total Protein 01/13/2019 7.9  6.5 - 8.1 g/dL Final  . Albumin 01/13/2019 3.7  3.5 - 5.0 g/dL Final  . AST 01/13/2019 36  15 - 41 U/L Final  . ALT 01/13/2019 18  0 - 44 U/L Final  . Alkaline Phosphatase 01/13/2019 55  38 - 126 U/L Final  . Total Bilirubin 01/13/2019 0.6  0.3 - 1.2 mg/dL Final  . GFR calc non Af Amer 01/13/2019 >60  >60 mL/min Final  . GFR calc Af Amer 01/13/2019 >60  >60 mL/min Final  . Anion gap 01/13/2019 11  5 - 15 Final   Performed at [facility], [address], [place], [place] [address]  . WBC 01/13/2019 6.2  4.0 - 10.5 K/uL Final  . RBC 01/13/2019 4.18  3.87 - 5.11 MIL/uL Final  . Hemoglobin 01/13/2019 11.9* 12.0 - 15.0 g/dL Final  . HCT 01/13/2019 35.8* 36.0 - 46.0 % Final  . MCV 01/13/2019 85.6  80.0 - 100.0 fL Final  . MCH 01/13/2019 28.5  26.0 - 34.0 pg Final  . MCHC 01/13/2019 33.2  30.0 -  36.0 g/dL Final  . RDW 01/13/2019 12.0  11.5 - 15.5 % Final  . Platelets 01/13/2019 277  150 - 400 K/uL Final  . nRBC 01/13/2019 0.0  0.0 - 0.2 % Final  . Neutrophils Relative % 01/13/2019 65  % Final  . Neutro Abs 01/13/2019 4.0  1.7 - 7.7 K/uL Final  . Lymphocytes Relative 01/13/2019 26  % Final  . Lymphs Abs 01/13/2019 1.6  0.7 - 4.0 K/uL Final  . Monocytes Relative 01/13/2019 6  % Final  . Monocytes Absolute 01/13/2019 0.4  0.1 - 1.0 K/uL Final  . Eosinophils Relative 01/13/2019 2  % Final  . Eosinophils Absolute 01/13/2019 0.1  0.0 - 0.5 K/uL Final  . Basophils Relative 01/13/2019 1  % Final  . Basophils  Absolute 01/13/2019 0.1  0.0 - 0.1 K/uL Final  . Immature Granulocytes 01/13/2019 0  % Final  . Abs Immature Granulocytes 01/13/2019 0.02  0.00 - 0.07 K/uL Final   Performed at [facility], [address] [place], [place] [address]    Assessment:  Hailey Hawkins is a 73 y.o. 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  Pathology revealed a 1.4 cm focus of grade 3 comedo type DCIS with microcalcifications and desmoplastic stromal response.  There was no evidence of invasive carcinoma. Margins were clear.  Tumor was ER was negative (< 1%) and PR was negative (0%).  She completed radiation to her right breast on 04/04/2014.   Mammogram on 12/17/2015 revealed a new group of 6 mm calcifications of the right breast which appeared around a central lucency suggestive of fat necrosis.  Right-sided mammogram on 06/17/2016 revealed calcifications within the medial right breasts benign consistent with evolving fat necrosis.  Bilateral mammogram on 01/05/2019 revealed no evidence of malignancy.  Chest CT on 11/03/2018 revealed an 11 mm groundglass opacity in the peripheral RUL mildly increased in size since 2012.  Indolent pulmonary adenocarcinoma could not be excluded.  Gastrohepatic ligament and retrocrural lymph nodes appear increased in size since CT imaging in 2012, but remained normal by size criteria.  She has a history of iron deficiency.  She is on oral iron.  EGD on 08/14/2016 revealed a small hiatal hernia and a discolored mucosa in the gastric body (iron pill gastritis).  Esophagus biopsy revealed reflux without dysplasia or malignancy.  Stomach polyp was hyperplastic and negative for H pylori, dysplasia or malignancy.  Colonoscopy on 08/14/2016 revealed 1 diminutive polyp in the descending colon, removed with forceps.  Pathology revealed a tubular adenoma which was negative for dysplasia or malignancy.  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  Sodium ranges  between 126-134.  Symptomatically, she is fatigued.  Exam is stable.  Hemoglobin is 11.9.  Sodium is 132.  Plan: 1.   Labs today:  CBC with diff, CMP, ferritin, iron studies. 2.   Left breast DCIS  Clinically doing well.  Patient is s/p wide excision and radiation.  Bilateral mammogram on 01/05/2019 was negative.  No adjuvant endocrine therapy as DCIS is hormone receptor negative.  Continue yearly surveillance. 3.  Right upper lobe nodule  Patient has a 11 mm groundglass opacity in the peripheral RUL.  Etiology unclear.  She may have an indolent pulmonary adenocarcinoma.  Review consults by Dr. Genevive Bi and Dr Baruch Gouty.  Agree with plan for follow-up chest CT in 04/2019. 4.   Iron deficiency anemia  Hemoglobin 11.9.  MCV 85.6.  Ferritin 91 and iron saturation 25%. 5.  Weight loss  Etiology unclear.  Discuss evaluation if etiology is unclear. 6.   RTC in 1 year for MD assessment and labs (CBC with diff, CMP, ferritin, iron studies).   Honor Loh, NP  01/13/2019, 3:00 PM   I saw and evaluated the patient, participating in the key portions of the service and reviewing pertinent diagnostic studies and records.  I reviewed the nurse practitioner's note and agree with the findings and the plan.  The assessment and plan were discussed with the patient.  Multiple questions were asked by the patient and answered.   Nolon Stalls, MD 01/13/2019, 3:00 PM

## 2019-03-21 DIAGNOSIS — J3489 Other specified disorders of nose and nasal sinuses: Secondary | ICD-10-CM | POA: Insufficient documentation

## 2019-05-06 ENCOUNTER — Encounter: Payer: Self-pay | Admitting: Hematology and Oncology

## 2019-05-06 DIAGNOSIS — R911 Solitary pulmonary nodule: Secondary | ICD-10-CM | POA: Insufficient documentation

## 2019-05-11 ENCOUNTER — Ambulatory Visit
Admission: RE | Admit: 2019-05-11 | Discharge: 2019-05-11 | Disposition: A | Discharge status: Home / Self Care | Payer: Medicare Other | Source: Ambulatory Visit | Attending: Radiation Oncology | Admitting: Radiation Oncology

## 2019-05-11 ENCOUNTER — Other Ambulatory Visit: Payer: Self-pay

## 2019-05-11 ENCOUNTER — Ambulatory Visit: Payer: Medicare Other

## 2019-05-11 DIAGNOSIS — R918 Other nonspecific abnormal finding of lung field: Secondary | ICD-10-CM | POA: Insufficient documentation

## 2019-05-11 LAB — POCT I-STAT CREATININE: Creatinine, Ser: 0.5 mg/dL (ref 0.44–1.00)

## 2019-05-11 MED ORDER — IOHEXOL 300 MG/ML  SOLN
75.0000 mL | Freq: Once | INTRAMUSCULAR | Status: AC | PRN
  Administered 2019-05-11: 75 mL via INTRAVENOUS

## 2019-05-17 ENCOUNTER — Other Ambulatory Visit: Payer: Self-pay

## 2019-05-18 ENCOUNTER — Other Ambulatory Visit: Payer: Self-pay | Admitting: *Deleted

## 2019-05-18 ENCOUNTER — Other Ambulatory Visit: Payer: Self-pay

## 2019-05-18 ENCOUNTER — Ambulatory Visit
Admission: RE | Admit: 2019-05-18 | Discharge: 2019-05-18 | Disposition: A | Discharge status: Home / Self Care | Payer: Medicare Other | Source: Ambulatory Visit | Attending: Radiation Oncology | Admitting: Radiation Oncology

## 2019-05-18 ENCOUNTER — Encounter (INDEPENDENT_AMBULATORY_CARE_PROVIDER_SITE_OTHER): Payer: Self-pay

## 2019-05-18 ENCOUNTER — Encounter: Payer: Self-pay | Admitting: Radiation Oncology

## 2019-05-18 VITALS — BP 159/76 | HR 66 | Resp 18 | Wt 207.9 lb

## 2019-05-18 DIAGNOSIS — R918 Other nonspecific abnormal finding of lung field: Secondary | ICD-10-CM | POA: Diagnosis present

## 2019-05-18 DIAGNOSIS — I712 Thoracic aortic aneurysm, without rupture: Secondary | ICD-10-CM | POA: Insufficient documentation

## 2019-05-18 DIAGNOSIS — R911 Solitary pulmonary nodule: Secondary | ICD-10-CM

## 2019-05-18 NOTE — Progress Notes (Signed)
Radiation Oncology Follow up Note  Name: Hailey Hawkins   Date:   05/18/2019 MRN:  [id number] DOB: 1946-07-16    This 73 y.o. female presents to the clinic today for 10-month follow-up tracking a right upper lobe lung nodule.  REFERRING PROVIDER: Idelle Crouch, MD  HPI: Patient is a 73 year old female we have been tracking a right upper lobe lung nodule that I originally consulted her for back in November 2019.Marland Kitchen  She is asymptomatic.  Recent CT scan showed a 6 mm right upper lobe pulmonary nodule showing no significant change compared to previous studies.  By consensus statement 1 year follow-up study was recommended.  She does have a 4 cm ascending thoracic aortic aneurysm with annual imaging recommended.  COMPLICATIONS OF TREATMENT: none  FOLLOW UP COMPLIANCE: keeps appointments   PHYSICAL EXAM:  BP (!) 159/76 (BP Location: Left Arm, Patient Position: Sitting)   Pulse 66   Resp 18   Wt 207 lb 14.3 oz (94.3 kg)   BMI 35.13 kg/m  Well-developed well-nourished patient in NAD. HEENT reveals PERLA, EOMI, discs not visualized.  Oral cavity is clear. No oral mucosal lesions are identified. Neck is clear without evidence of cervical or supraclavicular adenopathy. Lungs are clear to A&P. Cardiac examination is essentially unremarkable with regular rate and rhythm without murmur rub or thrill. Abdomen is benign with no organomegaly or masses noted. Motor sensory and DTR levels are equal and symmetric in the upper and lower extremities. Cranial nerves II through XII are grossly intact. Proprioception is intact. No peripheral adenopathy or edema is identified. No motor or sensory levels are noted. Crude visual fields are within normal range.  RADIOLOGY RESULTS: Serial CT scans are reviewed and compatible with above-stated findings  PLAN: At the present time I have ordered a one-year follow-up with a CT scan prior to that visit.  We will follow consensus statement if there is no change over  another year we will discontinue surveillance CT scans.  We will also evaluate her thoracic aortic aneurysm at that time.  Patient knows to call with any problems at any time she is very relieved with the recent news.  I would like to take this opportunity to thank you for allowing me to participate in the care of your patient.Noreene Filbert, MD

## 2020-01-13 ENCOUNTER — Inpatient Hospital Stay: Payer: Medicare Other

## 2020-01-13 ENCOUNTER — Inpatient Hospital Stay: Payer: Medicare Other | Attending: Hematology and Oncology | Admitting: Hematology and Oncology

## 2020-01-13 ENCOUNTER — Other Ambulatory Visit: Payer: Self-pay | Admitting: Hematology and Oncology

## 2020-01-13 DIAGNOSIS — D0511 Intraductal carcinoma in situ of right breast: Secondary | ICD-10-CM

## 2020-01-13 DIAGNOSIS — R911 Solitary pulmonary nodule: Secondary | ICD-10-CM

## 2020-01-13 DIAGNOSIS — D509 Iron deficiency anemia, unspecified: Secondary | ICD-10-CM

## 2020-01-18 ENCOUNTER — Encounter: Payer: Self-pay | Admitting: *Deleted

## 2020-01-30 NOTE — Progress Notes (Signed)
This encounter was created in error - please disregard.

## 2020-02-05 NOTE — Progress Notes (Signed)
[facility]  [address] [place], [place] [address] Phone: [phone]  Fax: [phone]   Clinic Day:  02/08/2020  Referring physician: Idelle Crouch, MD  Chief Complaint: Hailey Hawkins is a 74 y.o. 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   HPI: The patient was last seen in the medical oncology clinic on 01/13/2019. At that time, she was fatigued.  Exam was stable.  Hemoglobin was 11.9.  Sodium was 132. Ferritin was 91.  Chest CT on 05/11/2019 revealed a 6 mm right upper lobe pulmonary nodule with no significant change compared to 11/03/2018, although once noted to show minimal increase in size compared to 2012 imaging.  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 Recommendation was for annual imaging by CTA or MRA.  She was seen for follow up by Dr. Baruch Gouty on 05/18/2019 for follow-up of the RUL nodule.  She was asymptomatic.  She is scheduled for chest CT on 04/25/2020.  CBC followed 02/10/2019: Hematocrit 36.3, hemoglobin 11.9, MCV 87.5, platelets 305,000, WBC 6100, ANC 4170.  06/21/2019: Hematocrit 35.5, hemoglobin 11.9, MCV 87.2, platelets 301,000, WBC 7600, ANC 5310.  11/09/2019: Hematocrit 36.2, hemoglobin 11.9, MCV 88.3, platelets 289,000, WBC 6300, ANC 4070.   During the interim, she is felt "pretty good".  She states that she feels better than she did a year ago.  She is still tired a lot.  She comments that she does not eat like she should.  She only goes out for physician appointments secondary to COVID-19.  She examines her breasts periodically.  She denies any concerns.  She states that she was diagnosed with Wegener's granulomatosis.  She has "2 spots in her right lung".  She is currently on observation without treatment.  She is on oxygen at night.  She sees Dr. Marlowe Sax in rheumatology and Dr Wallene Huh in pulmonary  medicine.  Symptomatically, she notes shortness of breath.  She also notes chest pain a few weeks ago for which she was placed on isosorbide mononitrate.  Hypertension is under control.   Past Medical History:  Diagnosis Date  . Anemia    pernicious  . Asthma   . Back pain   . Breast cancer ([place]) 2015   right c lumpectomy c radiation  . Breast injury Few Weeks ago   Patient fell a few weeks ago -- Rt breast may be tender when compressed  . DCIS (ductal carcinoma in situ) of breast   . Diabetes mellitus without complication ([place])   . Elevated cholesterol   . GERD (gastroesophageal reflux disease)   . Hashimoto's disease   . Hypertension   . Hypothyroidism   . MRSA infection   . Obesity goither  . Personal history of radiation therapy 2015   right breast DCIS  . SIADH (syndrome of inappropriate ADH production) ([place])   . SOB (shortness of breath)   . Tachycardia     Past Surgical History:  Procedure Laterality Date  . ABDOMINAL HYSTERECTOMY    . BREAST BIOPSY Right 11/2013   DCIS  . BREAST LUMPECTOMY Right 2015   DCIS, clear margins  . CESAREAN SECTION     x2  . colononoscopy    . COLONOSCOPY N/A 08/14/2016   Procedure: COLONOSCOPY;  Surgeon: Manya Silvas, MD;  Location: [facility] ENDOSCOPY;  Service: Endoscopy;  Laterality: N/A;  . COLONOSCOPY WITH ESOPHAGOGASTRODUODENOSCOPY (EGD)    .  ESOPHAGOGASTRODUODENOSCOPY (EGD) WITH PROPOFOL N/A 08/14/2016   Procedure: ESOPHAGOGASTRODUODENOSCOPY (EGD) WITH PROPOFOL;  Surgeon: Manya Silvas, MD;  Location: [facility] ENDOSCOPY;  Service: Endoscopy;  Laterality: N/A;  . MASTECTOMY    . MASTECTOMY, PARTIAL Right   . REPLACEMENT TOTAL KNEE Right   . REPLACEMENT TOTAL KNEE Left   . THYROIDECTOMY, PARTIAL     caused throat paralysis  right side  . TONSILLECTOMY      Family History  Problem Relation Age of Onset  . Cancer Daughter        colon cancer; age 98  . Breast cancer Neg Hx     Social History:  reports that she has never  smoked. She has never used smokeless tobacco. She reports that she does not drink alcohol or use drugs. The patient lives in [place]. The patient is alone today.  Allergies:  Allergies  Allergen Reactions  . Methamphetamine Shortness Of Breath    Difficulty breathing (INHALER)  . Amoxicillin     Other reaction(s): Diarrhea and vomiting (finding)  . Antihistamines, Chlorpheniramine-Type     Other reaction(s): Other (See Comments) Severe dryness  . Ciprofloxacin     Other reaction(s): Distress (finding)  . Codeine     Other reaction(s): Diarrhea and vomiting (finding), Weal or any derivitive  . Other Other (See Comments)    Birth control pills - blood clots Birth control pills - blood clots  . Oxycodone-Acetaminophen Nausea And Vomiting  . Penicillin G     Other reaction(s): Weal  . Meloxicam Palpitations    Current Medications: Current Outpatient Medications  Medication Sig Dispense Refill  . acetaminophen (TYLENOL) 500 MG tablet Take 1,000 mg by mouth every 6 (six) hours as needed for mild pain.    Marland Kitchen ALPRAZolam (XANAX) 0.5 MG tablet Take 0.5 mg by mouth daily.    . Ascorbic Acid (VITAMIN C PO) Take by mouth.    . benazepril (LOTENSIN) 40 MG tablet 40 mg daily.     . cyanocobalamin 1000 MCG tablet Inject 1,000 mcg into the muscle every 30 (thirty) days.    Marland Kitchen docusate sodium (COLACE) 100 MG capsule Take 100 mg by mouth 2 (two) times daily.    Marland Kitchen glucose blood (FREESTYLE LITE) test strip Use 2 (two) times daily    . hydrALAZINE (APRESOLINE) 100 MG tablet 50 mg 3 (three) times daily.     Marland Kitchen ibandronate (BONIVA) 150 MG tablet     . insulin glargine (LANTUS) 100 UNIT/ML injection Inject 30 Units into the skin at bedtime.    . Insulin Pen Needle (NOVOFINE) 30G X 8 MM MISC as directed To inject insulin.    . Insulin Syringe-Needle U-100 (INSULIN SYRINGE .5CC/31GX5/16") 31G X 5/16" 0.5 ML MISC Use 1 syringe daily for insulin injections    . isosorbide mononitrate (IMDUR) 30 MG 24 hr  tablet Take by mouth.    . levothyroxine (SYNTHROID, LEVOTHROID) 100 MCG tablet Take 100 mcg by mouth daily before breakfast.    . magnesium oxide (MAG-OX) 400 MG tablet Take 400 mg by mouth daily.     Marland Kitchen MELATONIN PO Take by mouth.    . metFORMIN (GLUCOPHAGE) 1000 MG tablet Take 1,000 mg by mouth 2 (two) times daily with a meal.    . metoprolol tartrate (LOPRESSOR) 50 MG tablet Take 50 mg by mouth 2 (two) times daily.    . Multiple Vitamins-Minerals (ZINC PO) Take by mouth.    Marland Kitchen omeprazole (PRILOSEC OTC) 20 MG tablet Take 20 mg by mouth  daily.     . OXYGEN Inhale 2 L into the lungs at bedtime.    . sucralfate (CARAFATE) 1 G tablet Take 1 g by mouth 4 (four) times daily.     . traZODone (DESYREL) 50 MG tablet     . Witch Hazel (TUCKS) 50 % PADS Apply 1 application topically every 2 (two) hours as needed. 40 each 2  . albuterol (PROVENTIL HFA;VENTOLIN HFA) 108 (90 BASE) MCG/ACT inhaler Inhale 2 puffs into the lungs every 4 (four) hours as needed for wheezing or shortness of breath.    Marland Kitchen atorvastatin (LIPITOR) 10 MG tablet Take 10 mg by mouth daily.     . cloNIDine (CATAPRES) 0.2 MG tablet Take 0.2 mg by mouth daily.     Marland Kitchen dextromethorphan-guaiFENesin (MUCINEX DM) 30-600 MG per 12 hr tablet Take 1 tablet by mouth 2 (two) times daily as needed for cough.    . ferrous sulfate 325 (65 FE) MG tablet Take 325 mg by mouth 2 (two) times daily with a meal.    . Fluticasone-Salmeterol (ADVAIR) 250-50 MCG/DOSE AEPB Inhale 1 puff into the lungs 2 (two) times daily.    . pramoxine (PROCTOFOAM) 1 % foam Place 1 application rectally 3 (three) times daily as needed for hemorrhoids. (Patient not taking: Reported on 02/07/2020) 15 g 0   No current facility-administered medications for this visit.    Review of Systems  Constitutional: Positive for weight loss (13 pounds). Negative for chills, diaphoresis, fever and malaise/fatigue.       Feels "pretty good".  Still tired.  HENT: Negative.  Negative for  congestion, ear pain, nosebleeds, sinus pain and sore throat.   Eyes: Negative.  Negative for blurred vision, double vision and photophobia.  Respiratory: Positive for shortness of breath (with exertion). Negative for cough, hemoptysis and sputum production.        Nocturnal supplemental oxygen.  Cardiovascular: Negative for chest pain (on isosorbide mononitrate), palpitations, orthopnea, leg swelling and PND.       Pretension under control.  Gastrointestinal: Negative for abdominal pain, blood in stool, constipation, diarrhea, melena, nausea and vomiting.       Colonic polyps.  Last EGD and colonoscopy in 07/2016.  Genitourinary: Negative.  Negative for dysuria, frequency, hematuria and urgency.  Musculoskeletal: Negative.  Negative for back pain, falls, joint pain and myalgias.  Skin: Negative.  Negative for itching and rash.  Neurological: Negative for dizziness, tremors, sensory change, speech change, focal weakness, weakness and headaches.  Endo/Heme/Allergies: Negative.  Does not bruise/bleed easily.       Diabetes under good control.  Psychiatric/Behavioral: Negative.  Negative for depression and memory loss. The patient is not nervous/anxious and does not have insomnia.   All other systems reviewed and are negative.  Performance status (ECOG): 1  Vitals Blood pressure 131/76, pulse 70, temperature 98.5 F (36.9 C), temperature source Tympanic, resp. rate 18, height 5\' 4"  (1.626 m), weight 202 lb 11.4 oz (92 kg), SpO2 97 %.   Physical Exam  Constitutional: She is oriented to person, place, and time. She appears well-developed and well-nourished. No distress.  HENT:  Head: Atraumatic.  Mouth/Throat: Oropharynx is clear and moist. No oropharyngeal exudate.  Styled gray hair.  Mask.  Eyes: Pupils are equal, round, and reactive to light. Conjunctivae and EOM are normal. No scleral icterus.  Blue eyes.  Neck: No JVD present.  Cardiovascular: Normal rate and normal heart sounds.   Pulmonary/Chest: Breath sounds normal. No respiratory distress. She has no wheezes. She  has no rales. Right breast exhibits no nipple discharge, no skin change and no tenderness. Left breast exhibits no nipple discharge, no skin change and no tenderness. Breasts are symmetrical.  Bilateral fibrocystic changes.  Abdominal: Soft. Bowel sounds are normal. She exhibits no distension. There is no abdominal tenderness. There is no rebound and no guarding.  Musculoskeletal:        General: No edema. Normal range of motion.     Cervical back: Normal range of motion and neck supple.  Lymphadenopathy:       Head (right side): No preauricular, no posterior auricular and no occipital adenopathy present.       Head (left side): No preauricular, no posterior auricular and no occipital adenopathy present.    She has no cervical adenopathy.    She has no axillary adenopathy.       Right: No inguinal and no supraclavicular adenopathy present.       Left: No inguinal and no supraclavicular adenopathy present.  Neurological: She is alert and oriented to person, place, and time.  Skin: Skin is warm and dry. No rash noted. She is not diaphoretic. No erythema. No pallor.  Psychiatric: She has a normal mood and affect. Her behavior is normal. Judgment and thought content normal.  Nursing note and vitals reviewed.   No visits with results within 3 Day(s) from this visit.  Latest known visit with results is:  Hospital Outpatient Visit on 05/11/2019  Component Date Value Ref Range Status  . Creatinine, Ser 05/11/2019 0.50  0.44 - 1.00 mg/dL Final    Assessment:  Hailey Hawkins is a 74 y.o. 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  Pathology revealed a 1.4 cm focus of grade 3 comedo type DCIS with microcalcifications and desmoplastic stromal response.  There was no evidence of invasive carcinoma. Margins were clear.  Tumor was ER was negative (< 1%) and PR was negative (0%).  She completed  radiation to her right breast on 04/04/2014.   Mammogram on 12/17/2015 revealed a new group of 6 mm calcifications of the right breast which appeared around a central lucency suggestive of fat necrosis.  Right-sided mammogram on 06/17/2016 revealed calcifications within the medial right breasts benign consistent with evolving fat necrosis.  Bilateral mammogram on 01/05/2019 revealed no evidence of malignancy.  Chest CT on 11/03/2018 revealed an 11 mm groundglass opacity in the peripheral RUL mildly increased in size since 2012.  Indolent pulmonary adenocarcinoma could not be excluded.  Gastrohepatic ligament and retrocrural lymph nodes appear increased in size since CT imaging in 2012, but remained normal by size criteria.  Chest CT on 05/11/2019 revealed a 6 mm right upper lobe pulmonary nodule with no significant change compared to 11/03/2018, although once noted to show minimal increase in size compared to 2012 imaging.  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   She has a history of iron deficiency.  She is on oral iron.  EGD on 08/14/2016 revealed a small hiatal hernia and a discolored mucosa in the gastric body (iron pill gastritis).  Esophagus biopsy revealed reflux without dysplasia or malignancy.  Stomach polyp was hyperplastic and negative for H pylori, dysplasia or malignancy.  Colonoscopy on 08/14/2016 revealed 1 diminutive polyp in the descending colon, removed with forceps.  Pathology revealed a tubular adenoma which was negative for dysplasia or malignancy.  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  Sodium ranges between 126-  Symptomatically, she feels "pretty good".  She remains tired.  Exam is stable.  Plan: 1.   Labs today:  CBC with diff, ferritin, iron studies. 2.   Left breast DCIS             Clinically, she continues to do well.             Exam reveals no evidence of recurrent disease.    She is s/p wide excision and radiation.              Bilateral mammogram on 01/05/2019 was negative.             Schedule screening mammogram on 02/13/2020.  She received no adjuvant endocrine therapy as DCIS is hormone receptor negative.             Continue yearly surveillance. 3.  Right upper lobe nodule             Chest CT on 11/03/2018 revealed a 11 mm groundglass opacity in the peripheral RUL.             Chest CT on 05/11/2019 revealed a 6 mm right upper lobe pulmonary nodule (stable).   Nodule once showed minimal increase in size compared to 2012 imaging.   There was a 4 cm ascending thoracic aneurysm.   Recommendation for annual imaging by CTA or MRA.             Patient followed by Dr Raul Del and Dr Annalee Genta. 4.   Iron deficiency anemia, resolved             Hematocrit 37.0.  Hemoglobin 12.3.  MCV 85.5.             Ferritin 78 with an iron saturation of 29% and a TIBC of 286.  Continue to monitor. 5.   Weight loss             Patient continues to lose weight.  Etiology remains unclear.             Discuss follow-up with Dr Doy Hutching.    Consider additional work-up. 6.     RTC in 1 year for MD assessment, labs (CBC with diff, CMP, ferritin, iron studies) and review of interval mammogram.    Addendum: Bilateral screening mammogram on 03/05/2020 revealed no evidence of malignancy.  I discussed the assessment and treatment plan with the patient.  The patient was provided an opportunity to ask questions and all were answered.  The patient agreed with the plan and demonstrated an understanding of the instructions.  The patient was advised to call back if the symptoms worsen or if the condition fails to improve as anticipated.   Hailey Asal, MD, PhD    02/08/2020, 3:25 PM

## 2020-02-07 ENCOUNTER — Other Ambulatory Visit: Payer: Self-pay

## 2020-02-07 ENCOUNTER — Encounter: Payer: Self-pay | Admitting: Hematology and Oncology

## 2020-02-07 NOTE — Progress Notes (Signed)
Patient confirmed name and date of birth for telephone assessment prior to appointment tomorrow. Patient states that she has no concerns right now.

## 2020-02-08 ENCOUNTER — Inpatient Hospital Stay: Payer: Medicare Other | Attending: Hematology and Oncology | Admitting: Hematology and Oncology

## 2020-02-08 ENCOUNTER — Inpatient Hospital Stay: Payer: Medicare Other

## 2020-02-08 ENCOUNTER — Encounter: Payer: Self-pay | Admitting: Hematology and Oncology

## 2020-02-08 VITALS — BP 131/76 | HR 70 | Temp 98.5°F | Resp 18 | Ht 64.0 in | Wt 202.7 lb

## 2020-02-08 DIAGNOSIS — D509 Iron deficiency anemia, unspecified: Secondary | ICD-10-CM

## 2020-02-08 DIAGNOSIS — R911 Solitary pulmonary nodule: Secondary | ICD-10-CM | POA: Diagnosis not present

## 2020-02-08 DIAGNOSIS — R634 Abnormal weight loss: Secondary | ICD-10-CM | POA: Diagnosis not present

## 2020-02-08 DIAGNOSIS — D0511 Intraductal carcinoma in situ of right breast: Secondary | ICD-10-CM | POA: Insufficient documentation

## 2020-02-08 LAB — CBC WITH DIFFERENTIAL/PLATELET
Abs Immature Granulocytes: 0.02 10*3/uL (ref 0.00–0.07)
Basophils Absolute: 0.1 10*3/uL (ref 0.0–0.1)
Basophils Relative: 1 %
Eosinophils Absolute: 0.2 10*3/uL (ref 0.0–0.5)
Eosinophils Relative: 3 %
HCT: 37 % (ref 36.0–46.0)
Hemoglobin: 12.3 g/dL (ref 12.0–15.0)
Immature Granulocytes: 0 %
Lymphocytes Relative: 22 %
Lymphs Abs: 1.7 10*3/uL (ref 0.7–4.0)
MCH: 28.4 pg (ref 26.0–34.0)
MCHC: 33.2 g/dL (ref 30.0–36.0)
MCV: 85.5 fL (ref 80.0–100.0)
Monocytes Absolute: 0.5 10*3/uL (ref 0.1–1.0)
Monocytes Relative: 7 %
Neutro Abs: 5.3 10*3/uL (ref 1.7–7.7)
Neutrophils Relative %: 67 %
Platelets: 304 10*3/uL (ref 150–400)
RBC: 4.33 MIL/uL (ref 3.87–5.11)
RDW: 11.9 % (ref 11.5–15.5)
WBC: 7.9 10*3/uL (ref 4.0–10.5)
nRBC: 0 % (ref 0.0–0.2)

## 2020-02-08 LAB — FERRITIN: Ferritin: 78 ng/mL (ref 11–307)

## 2020-02-08 LAB — IRON AND TIBC
Iron: 84 ug/dL (ref 28–170)
Saturation Ratios: 29 % (ref 10.4–31.8)
TIBC: 286 ug/dL (ref 250–450)
UIBC: 202 ug/dL

## 2020-03-05 ENCOUNTER — Ambulatory Visit
Admission: RE | Admit: 2020-03-05 | Discharge: 2020-03-05 | Disposition: A | Discharge status: Home / Self Care | Payer: Medicare Other | Source: Ambulatory Visit | Attending: Hematology and Oncology | Admitting: Hematology and Oncology

## 2020-03-05 DIAGNOSIS — Z86000 Personal history of in-situ neoplasm of breast: Secondary | ICD-10-CM | POA: Insufficient documentation

## 2020-03-05 DIAGNOSIS — Z1231 Encounter for screening mammogram for malignant neoplasm of breast: Secondary | ICD-10-CM | POA: Insufficient documentation

## 2020-03-05 DIAGNOSIS — D0511 Intraductal carcinoma in situ of right breast: Secondary | ICD-10-CM

## 2020-04-25 ENCOUNTER — Ambulatory Visit: Admission: RE | Admit: 2020-04-25 | Payer: Medicare Other | Source: Ambulatory Visit

## 2020-05-04 ENCOUNTER — Other Ambulatory Visit: Payer: Self-pay

## 2020-05-04 ENCOUNTER — Ambulatory Visit
Admission: RE | Admit: 2020-05-04 | Discharge: 2020-05-04 | Disposition: A | Discharge status: Home / Self Care | Payer: Medicare Other | Source: Ambulatory Visit | Attending: Radiation Oncology | Admitting: Radiation Oncology

## 2020-05-04 DIAGNOSIS — R911 Solitary pulmonary nodule: Secondary | ICD-10-CM | POA: Diagnosis present

## 2020-05-04 LAB — POCT I-STAT CREATININE: Creatinine, Ser: 0.4 mg/dL — ABNORMAL LOW (ref 0.44–1.00)

## 2020-05-04 MED ORDER — IOHEXOL 300 MG/ML  SOLN
75.0000 mL | Freq: Once | INTRAMUSCULAR | Status: AC | PRN
  Administered 2020-05-04: 75 mL via INTRAVENOUS

## 2020-05-18 ENCOUNTER — Ambulatory Visit: Payer: Medicare Other | Admitting: Radiation Oncology

## 2020-05-18 ENCOUNTER — Other Ambulatory Visit: Payer: Self-pay

## 2020-05-21 ENCOUNTER — Other Ambulatory Visit: Payer: Self-pay

## 2020-05-21 ENCOUNTER — Encounter: Payer: Self-pay | Admitting: Radiation Oncology

## 2020-05-21 ENCOUNTER — Ambulatory Visit
Admission: RE | Admit: 2020-05-21 | Discharge: 2020-05-21 | Disposition: A | Discharge status: Home / Self Care | Payer: Medicare Other | Source: Ambulatory Visit | Attending: Radiation Oncology | Admitting: Radiation Oncology

## 2020-05-21 VITALS — BP 139/85 | HR 70 | Temp 96.2°F | Wt 200.0 lb

## 2020-05-21 DIAGNOSIS — R911 Solitary pulmonary nodule: Secondary | ICD-10-CM

## 2020-05-21 NOTE — Progress Notes (Signed)
Radiation Oncology Follow up Note  Name: Hailey Hawkins   Date:   05/21/2020 MRN:  [id number] DOB: 22-Apr-1946    This 74 y.o. female presents to the clinic today for follow-up of CT scan for a right upper lobe lung nodule we have been following since 2019.  REFERRING PROVIDER: Idelle Crouch, MD  HPI: Patient is a 74 year old female we been following a right upper lobe lung nodule back since November 2019 she is asymptomatic has a mild cough although she attributes that to some allergies.  She had a recent CT scan showing no acute findings with stable small pulmonary nodules bilaterally likely benign based on stability.  She also has a mild dilatation of her ascending aorta and aortic atherosclerosis.  COMPLICATIONS OF TREATMENT: none  FOLLOW UP COMPLIANCE: keeps appointments   PHYSICAL EXAM:  BP 139/85 (BP Location: Left Arm, Patient Position: Sitting, Cuff Size: Normal)   Pulse 70   Temp (!) 96.2 F (35.7 C) (Tympanic)   Wt 200 lb (90.7 kg)   BMI 34.33 kg/m  Well-developed well-nourished patient in NAD. HEENT reveals PERLA, EOMI, discs not visualized.  Oral cavity is clear. No oral mucosal lesions are identified. Neck is clear without evidence of cervical or supraclavicular adenopathy. Lungs are clear to A&P. Cardiac examination is essentially unremarkable with regular rate and rhythm without murmur rub or thrill. Abdomen is benign with no organomegaly or masses noted. Motor sensory and DTR levels are equal and symmetric in the upper and lower extremities. Cranial nerves II through XII are grossly intact. Proprioception is intact. No peripheral adenopathy or edema is identified. No motor or sensory levels are noted. Crude visual fields are within normal range.  RADIOLOGY RESULTS: CT scan reviewed compatible with above-stated findings.  PLAN: At this time I am going to turn follow-up care over to medical oncology she does see Dr. Mike Gip in [place].  I see no further need to follow  her with serial CT scans.  These are obviously benign nodules in her lung.  Patient knows to call with any concerns at any time.  I would like to take this opportunity to thank you for allowing me to participate in the care of your patient.Noreene Filbert, MD

## 2021-01-25 ENCOUNTER — Other Ambulatory Visit: Payer: Self-pay | Admitting: Internal Medicine

## 2021-01-25 DIAGNOSIS — Z1231 Encounter for screening mammogram for malignant neoplasm of breast: Secondary | ICD-10-CM

## 2021-02-04 ENCOUNTER — Other Ambulatory Visit: Payer: Self-pay

## 2021-02-06 NOTE — Progress Notes (Signed)
[facility]  [address], [address] [place], [place] [address] Phone: [phone]  Fax: [phone]   Clinic Day:  02/07/2021  Referring physician: Idelle Crouch, MD  Chief Complaint: Hailey Hawkins is a 75 y.o. 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   HPI: The patient was last seen in the medical oncology clinic on 02/08/2020. At that time, she felt "pretty good".  She remained tired.  Exam was stable. Hematocrit was 37.0, hemoglobin 12.3, platelets 304,000, WBC 7,900. Ferritin was 78 with an iron saturation of 29% and a TIBC of 286. We discussed continuing yearly surveillance. Recommended following up with Dr. Doy Hutching regarding weight loss.  Bilateral screening mammogram on 03/05/2020 revealed no evidence of malignancy.  Chest CT with contrast on 05/04/2020 revealed no acute findings or explanation for the patient's symptoms. There were stable small pulmonary nodules bilaterally, likely benign based on stability. There were no enlarging or new pulmonary nodules. There was aortic atherosclerosis with stable mild dilatation of the ascending aorta.  She saw Dr. Baruch Gouty on 05/21/2020. She reported a mild cough which she attributed to allergies. They reviewed her chest CT. The patient was to follow-up prn.  She saw Dr. Nehemiah Massed on 01/08/2021. She noted some shortness of breath with exertion (stable) consistent with aortic stenosis. Blood pressure was well controlled. Echo revealed normal EF function and trivial AR and mild AS. Stress test on 01/01/2021 was normal without evidence of ischemia or arrhythmia. She has a follow-up in 4 months (around 05/08/2021).  Mammogram is scheduled for 03/07/2021.  During the interim, she has been "good." She states that she is tired all the time. She sleeps well most nights and is not very active during the day.  The patient does not drink alcohol.   Past  Medical History:  Diagnosis Date  . Anemia    pernicious  . Asthma   . Back pain   . Breast cancer ([place]) 2015   right c lumpectomy c radiation  . DCIS (ductal carcinoma in situ) of breast   . Diabetes mellitus without complication ([place])   . Elevated cholesterol   . GERD (gastroesophageal reflux disease)   . Hashimoto's disease   . Hypertension   . Hypothyroidism   . MRSA infection   . Obesity goither  . Personal history of radiation therapy 2015   right breast DCIS  . SIADH (syndrome of inappropriate ADH production) ([place])   . SOB (shortness of breath)   . Tachycardia     Past Surgical History:  Procedure Laterality Date  . ABDOMINAL HYSTERECTOMY    . BREAST BIOPSY Right 11/2013   DCIS  . BREAST LUMPECTOMY Right 2015   DCIS, clear margins  . CESAREAN SECTION     x2  . colononoscopy    . COLONOSCOPY N/A 08/14/2016   Procedure: COLONOSCOPY;  Surgeon: Manya Silvas, MD;  Location: [facility] ENDOSCOPY;  Service: Endoscopy;  Laterality: N/A;  . COLONOSCOPY WITH ESOPHAGOGASTRODUODENOSCOPY (EGD)    . ESOPHAGOGASTRODUODENOSCOPY (EGD) WITH PROPOFOL N/A 08/14/2016   Procedure: ESOPHAGOGASTRODUODENOSCOPY (EGD) WITH PROPOFOL;  Surgeon: Manya Silvas, MD;  Location: [facility] ENDOSCOPY;  Service: Endoscopy;  Laterality: N/A;  . MASTECTOMY    . MASTECTOMY, PARTIAL Right   . REPLACEMENT TOTAL KNEE Right   . REPLACEMENT TOTAL KNEE Left   . THYROIDECTOMY, PARTIAL     caused throat paralysis  right side  . TONSILLECTOMY  Family History  Problem Relation Age of Onset  . Cancer Daughter        colon cancer; age 65  . Colon cancer Daughter   . Breast cancer Neg Hx     Social History:  reports that she has never smoked. She has never used smokeless tobacco. She reports that she does not drink alcohol and does not use drugs. The patient lives in [place]. The patient is alone today.  Allergies:  Allergies  Allergen Reactions  . Methamphetamine Shortness Of Breath    Difficulty  breathing (INHALER)  . Amoxicillin     Other reaction(s): Diarrhea and vomiting (finding)  . Antihistamines, Chlorpheniramine-Type     Other reaction(s): Other (See Comments) Severe dryness  . Ciprofloxacin     Other reaction(s): Distress (finding)  . Codeine     Other reaction(s): Diarrhea and vomiting (finding), Weal or any derivitive  . Other Other (See Comments)    Birth control pills - blood clots Birth control pills - blood clots  . Oxycodone-Acetaminophen Nausea And Vomiting  . Penicillin G     Other reaction(s): Weal  . Meloxicam Palpitations    Current Medications: Current Outpatient Medications  Medication Sig Dispense Refill  . acetaminophen (TYLENOL) 500 MG tablet Take 1,000 mg by mouth every 6 (six) hours as needed for mild pain.    Marland Kitchen albuterol (PROVENTIL HFA;VENTOLIN HFA) 108 (90 BASE) MCG/ACT inhaler Inhale 2 puffs into the lungs every 4 (four) hours as needed for wheezing or shortness of breath.    . ALPRAZolam (XANAX) 0.5 MG tablet Take 0.5 mg by mouth daily.    Marland Kitchen atorvastatin (LIPITOR) 10 MG tablet Take 10 mg by mouth daily.     . benazepril (LOTENSIN) 40 MG tablet 40 mg daily.     . cloNIDine (CATAPRES) 0.2 MG tablet Take 0.2 mg by mouth daily.     . cyanocobalamin 1000 MCG tablet Inject 1,000 mcg into the muscle every 30 (thirty) days.    Marland Kitchen docusate sodium (COLACE) 100 MG capsule Take 100 mg by mouth 2 (two) times daily.    Marland Kitchen glucose blood (FREESTYLE LITE) test strip Use 2 (two) times daily    . hydrALAZINE (APRESOLINE) 100 MG tablet 50 mg 3 (three) times daily.     . insulin glargine (LANTUS) 100 UNIT/ML injection Inject 20 Units into the skin at bedtime.    . Insulin Pen Needle (NOVOFINE) 30G X 8 MM MISC as directed To inject insulin.    . Insulin Syringe-Needle U-100 (INSULIN SYRINGE .5CC/31GX5/16") 31G X 5/16" 0.5 ML MISC Use 1 syringe daily for insulin injections    . levothyroxine (SYNTHROID, LEVOTHROID) 100 MCG tablet Take 100 mcg by mouth daily before  breakfast.    . magnesium oxide (MAG-OX) 400 MG tablet Take 400 mg by mouth daily.     Marland Kitchen MELATONIN PO Take by mouth.    . metFORMIN (GLUCOPHAGE) 1000 MG tablet Take 1,000 mg by mouth 2 (two) times daily with a meal.    . metoprolol tartrate (LOPRESSOR) 50 MG tablet Take 50 mg by mouth 2 (two) times daily.    . mupirocin ointment (BACTROBAN) 2 % Apply 1 application topically 3 (three) times daily as needed.    Marland Kitchen omeprazole (PRILOSEC OTC) 20 MG tablet Take 20 mg by mouth daily.     . OXYGEN Inhale 2 L into the lungs at bedtime.    . sucralfate (CARAFATE) 1 G tablet Take 1 g by mouth 4 (four) times daily.     Marland Kitchen  ferrous sulfate 325 (65 FE) MG tablet Take 325 mg by mouth 2 (two) times daily with a meal. (Patient not taking: Reported on 02/07/2021)    . ibandronate (BONIVA) 150 MG tablet  (Patient not taking: Reported on 02/07/2021)    . isosorbide mononitrate (IMDUR) 30 MG 24 hr tablet Take by mouth.    . Multiple Vitamins-Minerals (ZINC PO) Take by mouth. (Patient not taking: Reported on 02/07/2021)    . pramoxine (PROCTOFOAM) 1 % foam Place 1 application rectally 3 (three) times daily as needed for hemorrhoids. (Patient not taking: Reported on 02/07/2021) 15 g 0   No current facility-administered medications for this visit.    Review of Systems  Constitutional: Positive for malaise/fatigue ("tired all the time"). Negative for chills, diaphoresis, fever and weight loss (up 1 lb).       Feels "good."  HENT: Negative.  Negative for congestion, ear discharge, ear pain, hearing loss, nosebleeds, sinus pain, sore throat and tinnitus.   Eyes: Negative.  Negative for blurred vision.  Respiratory: Positive for shortness of breath (with exertion). Negative for cough, hemoptysis and sputum production.        Nocturnal supplemental oxygen.  Cardiovascular: Negative for chest pain (on isosorbide mononitrate), palpitations and leg swelling.  Gastrointestinal: Negative.  Negative for abdominal pain, blood in  stool, constipation, diarrhea, heartburn, melena, nausea and vomiting.  Genitourinary: Negative.  Negative for dysuria, frequency, hematuria and urgency.  Musculoskeletal: Negative.  Negative for back pain, joint pain, myalgias and neck pain.  Skin: Negative.  Negative for itching and rash.  Neurological: Negative.  Negative for dizziness, tingling, sensory change, weakness and headaches.  Endo/Heme/Allergies: Does not bruise/bleed easily.       Diabetes under good control.  Psychiatric/Behavioral: Negative.  Negative for depression and memory loss. The patient is not nervous/anxious and does not have insomnia.   All other systems reviewed and are negative.  Performance status (ECOG): 1  Vitals Blood pressure (!) 162/81, pulse 69, temperature 98.8 F (37.1 C), temperature source Tympanic, resp. rate 16, weight 203 lb 0.7 oz (92.1 kg), SpO2 98 %.   Physical Exam Vitals and nursing note reviewed.  Constitutional:      General: She is not in acute distress.    Appearance: She is not diaphoretic.  HENT:     Head: Normocephalic and atraumatic.     Mouth/Throat:     Mouth: Mucous membranes are moist.     Pharynx: Oropharynx is clear.  Eyes:     General: No scleral icterus.    Extraocular Movements: Extraocular movements intact.     Conjunctiva/sclera: Conjunctivae normal.     Pupils: Pupils are equal, round, and reactive to light.     Comments: Blue eyes  Cardiovascular:     Rate and Rhythm: Normal rate and regular rhythm.     Heart sounds: Murmur heard.    Pulmonary:     Effort: Pulmonary effort is normal. No respiratory distress.     Breath sounds: Normal breath sounds. No wheezing or rales.  Chest:     Chest wall: No tenderness.  Breasts:     Right: No swelling, bleeding, mass, skin change, tenderness, axillary adenopathy or supraclavicular adenopathy.     Left: Skin change (fibrocystic changes superiorly) and tenderness present. No swelling, bleeding, mass, axillary  adenopathy or supraclavicular adenopathy.    Abdominal:     General: Bowel sounds are normal. There is no distension.     Palpations: Abdomen is soft. There is no mass.  Tenderness: There is no abdominal tenderness. There is no guarding or rebound.  Musculoskeletal:        General: Tenderness (BLE) present. No swelling. Normal range of motion.     Cervical back: Normal range of motion and neck supple.  Lymphadenopathy:     Head:     Right side of head: No preauricular, posterior auricular or occipital adenopathy.     Left side of head: No preauricular, posterior auricular or occipital adenopathy.     Cervical: No cervical adenopathy.     Upper Body:     Right upper body: No supraclavicular or axillary adenopathy.     Left upper body: No supraclavicular or axillary adenopathy.     Lower Body: No right inguinal adenopathy. No left inguinal adenopathy.  Skin:    General: Skin is warm and dry.  Neurological:     Mental Status: She is alert and oriented to person, place, and time.  Psychiatric:        Behavior: Behavior normal.        Thought Content: Thought content normal.        Judgment: Judgment normal.     Appointment on 02/07/2021  Component Date Value Ref Range Status  . WBC 02/07/2021 6.7  4.0 - 10.5 K/uL Final  . RBC 02/07/2021 4.20  3.87 - 5.11 MIL/uL Final  . Hemoglobin 02/07/2021 11.8* 12.0 - 15.0 g/dL Final  . HCT 02/07/2021 35.4* 36.0 - 46.0 % Final  . MCV 02/07/2021 84.3  80.0 - 100.0 fL Final  . MCH 02/07/2021 28.1  26.0 - 34.0 pg Final  . MCHC 02/07/2021 33.3  30.0 - 36.0 g/dL Final  . RDW 02/07/2021 11.9  11.5 - 15.5 % Final  . Platelets 02/07/2021 274  150 - 400 K/uL Final  . nRBC 02/07/2021 0.0  0.0 - 0.2 % Final  . Neutrophils Relative % 02/07/2021 73  % Final  . Neutro Abs 02/07/2021 4.9  1.7 - 7.7 K/uL Final  . Lymphocytes Relative 02/07/2021 17  % Final  . Lymphs Abs 02/07/2021 1.1  0.7 - 4.0 K/uL Final  . Monocytes Relative 02/07/2021 6  % Final   . Monocytes Absolute 02/07/2021 0.4  0.1 - 1.0 K/uL Final  . Eosinophils Relative 02/07/2021 3  % Final  . Eosinophils Absolute 02/07/2021 0.2  0.0 - 0.5 K/uL Final  . Basophils Relative 02/07/2021 1  % Final  . Basophils Absolute 02/07/2021 0.0  0.0 - 0.1 K/uL Final  . Immature Granulocytes 02/07/2021 0  % Final  . Abs Immature Granulocytes 02/07/2021 0.02  0.00 - 0.07 K/uL Final   Performed at [facility], [address] [place], [place] [address]    Assessment:  Hailey Hawkins is a 75 y.o. 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  Pathology revealed a 1.4 cm focus of grade 3 comedo type DCIS with microcalcifications and desmoplastic stromal response.  There was no evidence of invasive carcinoma. Margins were clear.  Tumor was ER was negative (< 1%) and PR was negative (0%).  She completed radiation to her right breast on 04/04/2014.   Mammogram on 12/17/2015 revealed a new group of 6 mm calcifications of the right breast which appeared around a central lucency suggestive of fat necrosis.  Right-sided mammogram on 06/17/2016 revealed calcifications within the medial right breasts benign consistent with evolving fat necrosis.  Bilateral mammogram on 01/05/2019 revealed no evidence of malignancy.  Bilateral screening mammogram on 03/05/2020 revealed no  evidence of malignancy.  Chest CT on 11/03/2018 revealed an 11 mm groundglass opacity in the peripheral RUL mildly increased in size since 2012.  Indolent pulmonary adenocarcinoma could not be excluded.  Gastrohepatic ligament and retrocrural lymph nodes appear increased in size since CT imaging in 2012, but remained normal by size criteria.  Chest CT on 05/11/2019 revealed a 6 mm right upper lobe pulmonary nodule with no significant change compared to 11/03/2018, although once noted to show minimal increase in size compared to 2012 imaging.  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    Chest CT with contrast on 05/04/2020 revealed no acute findings or explanation for the patient's symptoms. There were stable small pulmonary nodules bilaterally, likely benign based on stability. There were no enlarging or new pulmonary nodules. There was aortic atherosclerosis with stable mild dilatation of the ascending aorta.  She has a history of iron deficiency.  She is on oral iron.  EGD on 08/14/2016 revealed a small hiatal hernia and a discolored mucosa in the gastric body (iron pill gastritis).  Esophagus biopsy revealed reflux without dysplasia or malignancy.  Stomach polyp was hyperplastic and negative for H pylori, dysplasia or malignancy.  Colonoscopy on 08/14/2016 revealed 1 diminutive polyp in the descending colon, removed with forceps.  Pathology revealed a tubular adenoma which was negative for dysplasia or malignancy.  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  Sodium ranges between 126-  Symptomatically, she feels "good." She is tired all the time. She sleeps well most nights and is not very active during the day.  Exam is stable.  Plan: 1.   Labs today: CBC with diff, CMP, ferritin, iron studies 2.   Left breast DCIS             Clinically, she is doing well             Exam reveals no evidence of recurrent disease  She is s/p wide excision and radiation.             Bilateral screening mammogram on 03/05/2020 revealed no evidence of malignancy.  She received no adjuvant endocrine therapy as DCIS is hormone receptor negative.             Continue yearly surveillance. 3.  Right upper lobe nodule             Chest CT on 11/03/2018 revealed a 11 mm groundglass opacity in the peripheral RUL.             Chest CT on 05/11/2019 revealed a 6 mm right upper lobe pulmonary nodule (stable).   Nodule once showed minimal increase in size compared to 2012 imaging.   There was a 4 cm ascending thoracic aneurysm.   Recommendation for annual imaging by CTA or MRA.             Patient is followed by Dr. Raul Del and Dr Annalee Genta. 4.   Iron deficiency anemia             Hematocrit 35.4.  Hemoglobin 11.8.  MCV 84.3.             Ferritin 74 with an iron saturation of 28% and a TIBC of 277.  To need to monitor. 5.   Weight loss             Patient has gained 1 pound.  Continue to monitor. 6.   Elevated LFTs  AST 62 and ALT 46 on 02/07/2021.  Patient denies alcohol  use or hepatitis.  Recheck labs in 2 weeks.  Follow-up with Dr. Doy Hutching. 7.   RTC in 2 weeks for labs (LFTs). 8.   RTC in 1 year for MD assess, labs (CBC with diff, CMP, ferritin, iron studies) and review of mammogram.   I discussed the assessment and treatment plan with the patient.  The patient was provided an opportunity to ask questions and all were answered.  The patient agreed with the plan and demonstrated an understanding of the instructions.  The patient was advised to call back if the symptoms worsen or if the condition fails to improve as anticipated.   Lequita Asal, MD, PhD    02/07/2021, 2:02 PM  I, De Burrs, am acting as scribe for [employer]. Mike Gip, MD, PhD.  I, Halley Kincer C. Mike Gip, MD, have reviewed the above documentation for accuracy and completeness, and I agree with the above.

## 2021-02-07 ENCOUNTER — Encounter: Payer: Self-pay | Admitting: Hematology and Oncology

## 2021-02-07 ENCOUNTER — Inpatient Hospital Stay: Payer: Medicare Other | Attending: Hematology and Oncology

## 2021-02-07 ENCOUNTER — Inpatient Hospital Stay (HOSPITAL_BASED_OUTPATIENT_CLINIC_OR_DEPARTMENT_OTHER): Payer: Medicare Other | Admitting: Hematology and Oncology

## 2021-02-07 VITALS — BP 162/81 | HR 69 | Temp 98.8°F | Resp 16 | Wt 203.0 lb

## 2021-02-07 DIAGNOSIS — R7989 Other specified abnormal findings of blood chemistry: Secondary | ICD-10-CM | POA: Diagnosis not present

## 2021-02-07 DIAGNOSIS — I712 Thoracic aortic aneurysm, without rupture: Secondary | ICD-10-CM | POA: Insufficient documentation

## 2021-02-07 DIAGNOSIS — Z86000 Personal history of in-situ neoplasm of breast: Secondary | ICD-10-CM | POA: Diagnosis present

## 2021-02-07 DIAGNOSIS — Z96653 Presence of artificial knee joint, bilateral: Secondary | ICD-10-CM | POA: Insufficient documentation

## 2021-02-07 DIAGNOSIS — R918 Other nonspecific abnormal finding of lung field: Secondary | ICD-10-CM | POA: Insufficient documentation

## 2021-02-07 DIAGNOSIS — D509 Iron deficiency anemia, unspecified: Secondary | ICD-10-CM | POA: Diagnosis not present

## 2021-02-07 DIAGNOSIS — D0511 Intraductal carcinoma in situ of right breast: Secondary | ICD-10-CM | POA: Diagnosis not present

## 2021-02-07 DIAGNOSIS — I700 Atherosclerosis of aorta: Secondary | ICD-10-CM | POA: Diagnosis not present

## 2021-02-07 DIAGNOSIS — R911 Solitary pulmonary nodule: Secondary | ICD-10-CM | POA: Diagnosis not present

## 2021-02-07 DIAGNOSIS — Z9011 Acquired absence of right breast and nipple: Secondary | ICD-10-CM | POA: Insufficient documentation

## 2021-02-07 DIAGNOSIS — R634 Abnormal weight loss: Secondary | ICD-10-CM | POA: Diagnosis not present

## 2021-02-07 DIAGNOSIS — E119 Type 2 diabetes mellitus without complications: Secondary | ICD-10-CM | POA: Diagnosis not present

## 2021-02-07 DIAGNOSIS — Z923 Personal history of irradiation: Secondary | ICD-10-CM | POA: Diagnosis not present

## 2021-02-07 DIAGNOSIS — Z90710 Acquired absence of both cervix and uterus: Secondary | ICD-10-CM | POA: Insufficient documentation

## 2021-02-07 LAB — FERRITIN: Ferritin: 74 ng/mL (ref 11–307)

## 2021-02-07 LAB — CBC WITH DIFFERENTIAL/PLATELET
Abs Immature Granulocytes: 0.02 10*3/uL (ref 0.00–0.07)
Basophils Absolute: 0 10*3/uL (ref 0.0–0.1)
Basophils Relative: 1 %
Eosinophils Absolute: 0.2 10*3/uL (ref 0.0–0.5)
Eosinophils Relative: 3 %
HCT: 35.4 % — ABNORMAL LOW (ref 36.0–46.0)
Hemoglobin: 11.8 g/dL — ABNORMAL LOW (ref 12.0–15.0)
Immature Granulocytes: 0 %
Lymphocytes Relative: 17 %
Lymphs Abs: 1.1 10*3/uL (ref 0.7–4.0)
MCH: 28.1 pg (ref 26.0–34.0)
MCHC: 33.3 g/dL (ref 30.0–36.0)
MCV: 84.3 fL (ref 80.0–100.0)
Monocytes Absolute: 0.4 10*3/uL (ref 0.1–1.0)
Monocytes Relative: 6 %
Neutro Abs: 4.9 10*3/uL (ref 1.7–7.7)
Neutrophils Relative %: 73 %
Platelets: 274 10*3/uL (ref 150–400)
RBC: 4.2 MIL/uL (ref 3.87–5.11)
RDW: 11.9 % (ref 11.5–15.5)
WBC: 6.7 10*3/uL (ref 4.0–10.5)
nRBC: 0 % (ref 0.0–0.2)

## 2021-02-07 LAB — COMPREHENSIVE METABOLIC PANEL
ALT: 46 U/L — ABNORMAL HIGH (ref 0–44)
AST: 62 U/L — ABNORMAL HIGH (ref 15–41)
Albumin: 3.3 g/dL — ABNORMAL LOW (ref 3.5–5.0)
Alkaline Phosphatase: 57 U/L (ref 38–126)
Anion gap: 7 (ref 5–15)
BUN: 13 mg/dL (ref 8–23)
CO2: 27 mmol/L (ref 22–32)
Calcium: 8.9 mg/dL (ref 8.9–10.3)
Chloride: 98 mmol/L (ref 98–111)
Creatinine, Ser: 0.62 mg/dL (ref 0.44–1.00)
GFR, Estimated: >60 mL/min (ref >60)
Glucose, Bld: 239 mg/dL — ABNORMAL HIGH (ref 70–99)
Potassium: 4.6 mmol/L (ref 3.5–5.1)
Sodium: 132 mmol/L — ABNORMAL LOW (ref 135–145)
Total Bilirubin: 0.6 mg/dL (ref 0.3–1.2)
Total Protein: 8.1 g/dL (ref 6.5–8.1)

## 2021-02-07 LAB — IRON AND TIBC
Iron: 78 ug/dL (ref 28–170)
Saturation Ratios: 28 % (ref 10.4–31.8)
TIBC: 277 ug/dL (ref 250–450)
UIBC: 199 ug/dL

## 2021-02-21 ENCOUNTER — Inpatient Hospital Stay: Payer: Medicare Other

## 2021-02-21 ENCOUNTER — Other Ambulatory Visit: Payer: Self-pay

## 2021-02-21 DIAGNOSIS — Z86000 Personal history of in-situ neoplasm of breast: Secondary | ICD-10-CM | POA: Diagnosis not present

## 2021-02-21 DIAGNOSIS — D0511 Intraductal carcinoma in situ of right breast: Secondary | ICD-10-CM

## 2021-02-21 LAB — HEPATIC FUNCTION PANEL
ALT: 39 U/L (ref 0–44)
AST: 50 U/L — ABNORMAL HIGH (ref 15–41)
Albumin: 3.2 g/dL — ABNORMAL LOW (ref 3.5–5.0)
Alkaline Phosphatase: 59 U/L (ref 38–126)
Bilirubin, Direct: <0.1 mg/dL (ref 0.0–0.2)
Total Bilirubin: 0.5 mg/dL (ref 0.3–1.2)
Total Protein: 8.2 g/dL — ABNORMAL HIGH (ref 6.5–8.1)

## 2021-02-27 ENCOUNTER — Telehealth: Payer: Self-pay

## 2021-02-27 NOTE — Telephone Encounter (Signed)
-----   Message from Lequita Asal, MD sent at 02/27/2021  5:50 AM EST ----- Regarding: Please send labs to PCP  ----- Message ----- From: Interface, Lab In [place] Sent: 02/21/2021   4:12 PM EST To: Lequita Asal, MD

## 2021-03-03 DIAGNOSIS — R7989 Other specified abnormal findings of blood chemistry: Secondary | ICD-10-CM | POA: Insufficient documentation

## 2021-03-03 DIAGNOSIS — R634 Abnormal weight loss: Secondary | ICD-10-CM | POA: Insufficient documentation

## 2021-03-07 ENCOUNTER — Other Ambulatory Visit: Payer: Self-pay

## 2021-03-07 ENCOUNTER — Ambulatory Visit
Admission: RE | Admit: 2021-03-07 | Discharge: 2021-03-07 | Disposition: A | Discharge status: Home / Self Care | Payer: Medicare Other | Source: Ambulatory Visit | Attending: Internal Medicine | Admitting: Internal Medicine

## 2021-03-07 DIAGNOSIS — Z1231 Encounter for screening mammogram for malignant neoplasm of breast: Secondary | ICD-10-CM | POA: Insufficient documentation

## 2021-10-17 ENCOUNTER — Ambulatory Visit: Payer: Self-pay | Admitting: General Surgery

## 2021-10-17 NOTE — H&P (View-Only) (Signed)
PATIENT PROFILE: Hailey Hawkins is a 75 y.o. female who presents to the Clinic for consultation at the request of  Tumey, PA for evaluation of abscess of the buttock.  PCP:  Idelle Crouch, MD  HISTORY OF PRESENT ILLNESS: Hailey Hawkins reports she has been feeling pain in the left buttock since a week ago.  The pain has been getting worse.  Pain has radiated to the genital area.  Aggravating factor is applying pressure.  There is no alleviating factors.  She went to see her PCP 2 days ago and a small incision was done and she was prescribed with doxycycline.  She understood that has been no significant improvement.  Today the pain is worse than 2 days ago.  She endorses having drainage.  She denies any fever or chills.  She denies any previous abscesses on this area.   PROBLEM LIST: Problem List  Date Reviewed: 09/11/2021          Noted   Ascending aorta dilatation (CMS-HCC) 01/08/2021   Overview    Noted on CT chest from 05/04/2020 3.9 cm stable mild      Nodule of upper lobe of right lung 05/06/2019   Nose dryness 03/21/2019   Type 2 diabetes mellitus with microalbuminuria, with long-term current use of insulin (CMS-HCC) 01/25/2019   Diabetes mellitus type 2 in obese (CMS-HCC) 01/25/2019   PR3 ANCA antibodies present 12/07/2018   Overview    p anca 12/1278 , pr3 ELEVATION 5.9 aNCA + 9.3 NEGATIVE C ANCA NEGATIVE ATYPICAL ANCA      Exertional dyspnea 12/07/2018   Chronic fatigue 12/07/2018   Abnormal CT of the chest 12/07/2018   Overview    RIGHT Upper lobe groundglass opacity      SOBOE (shortness of breath on exertion) 11/16/2018   Coronary artery disease involving native coronary artery of native heart 11/16/2018   Type II diabetes mellitus, uncontrolled 02/18/2018   Iron deficiency anemia 01/14/2018   Type 2 diabetes mellitus without complication, with long-term current use of insulin (CMS-HCC) 04/18/2016   Hx of adenomatous colonic polyps 03/26/2016   Mild aortic valve stenosis  02/07/2016   Chronic hyponatremia 07/05/2015   Acquired hypothyroidism, unspecified 03/06/2014   Pernicious anemia 03/06/2014   Nontoxic uninodular goiter 03/06/2014   Anemia, unspecified 03/06/2014   Essential hypertension, benign 03/06/2014   Diabetes mellitus without complication (CMS-HCC) 08/02/5363   Pure hypercholesterolemia 03/06/2014   Osteoarthrosis, unspecified whether generalized or localized, lower leg 03/06/2014   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       GENERAL REVIEW OF SYSTEMS:   General ROS: negative for - chills, fatigue, fever, weight gain or weight loss Allergy and Immunology ROS: negative for - hives  Hematological and Lymphatic ROS: negative for - bleeding problems or bruising, negative for palpable nodes Endocrine ROS: negative for - heat or cold intolerance, hair changes Respiratory ROS: negative for - cough, shortness of breath or wheezing Cardiovascular ROS: no chest pain or palpitations GI ROS: negative for nausea, vomiting, abdominal pain, diarrhea, constipation Musculoskeletal ROS: negative for - joint swelling or muscle pain Neurological ROS: negative for - confusion, syncope Dermatological ROS: negative for pruritus and rash Psychiatric: negative for anxiety, depression, difficulty sleeping and memory loss  MEDICATIONS: Current Outpatient Medications  Medication Sig Dispense Refill   acetaminophen (TYLENOL) 500 MG tablet Take 1 tablet (500 mg total) by mouth every 6 (six) hours as needed for Pain (Patient taking differently: Take 500 mg by mouth every 6 (six)  hours as needed for Pain Takes one tylenol every morning.) 180 tablet 3   ALPRAZolam (XANAX) 0.5 MG tablet Take 1 tablet (0.5 mg total) by mouth at bedtime as needed for Sleep 90 tablet 1   atorvastatin (LIPITOR) 10 MG tablet Take 1 tablet (10 mg total) by mouth once daily 90 tablet 1   benazepriL (LOTENSIN) 40 MG tablet Take 1 tablet (40 mg total) by mouth once daily 90 tablet 1   cloNIDine  HCL (CATAPRES) 0.2 MG tablet Take 1 tablet (0.2 mg total) by mouth 3 (three) times daily 270 tablet 3   clotrimazole (LOTRIMIN) 1 % cream Apply topically 2 (two) times daily 85 g 2   cyanocobalamin (VITAMIN B12) 1,000 mcg/mL injection Inject 1 mL (1,000 mcg total) into the muscle monthly. 10 mL 1   docusate (COLACE) 100 MG capsule Take 1 capsule (100 mg total) by mouth 2 (two) times daily 180 capsule 3   doxycycline (VIBRA-TABS) 100 MG tablet Take 1 tablet (100 mg total) by mouth 2 (two) times daily for 7 days 14 tablet 0   FREESTYLE LITE STRIPS test strip 2 (two) times daily Use as instructed. 200 strip 1   gabapentin (NEURONTIN) 300 MG capsule Take 1 capsule (300 mg total) by mouth 3 (three) times daily 90 capsule 2   hydrALAZINE (APRESOLINE) 50 MG tablet Take 1 tablet (50 mg total) by mouth 3 (three) times daily 270 tablet 3   insulin GLARGINE (LANTUS U-100 INSULIN) injection (concentration 100 units/mL) Inject 24 Units subcutaneously at bedtime 30 mL 2   insulin syringe-needle U-100 1 mL 31 gauge x 5/16 syringe Use once daily 100 each 2   isosorbide mononitrate (IMDUR) 30 MG ER tablet TAKE 1 TABLET(30 MG) BY MOUTH EVERY DAY 30 tablet 11   levothyroxine (SYNTHROID) 75 MCG tablet TAKE 1 TABLET(75 MCG) BY MOUTH EVERY DAY 30 TO 60 MINUTES BEFORE BREAKFAST ON AN EMPTY STOMACH AND WITH A GLASS OF WATER 30 tablet 11   magnesium oxide (MAG-OX) 400 mg (241.3 mg magnesium) tablet Take 1 tablet (400 mg total) by mouth once daily 90 tablet 3   metFORMIN (GLUCOPHAGE) 1000 MG tablet Take 1 tablet (1,000 mg total) by mouth 2 (two) times daily with meals 180 tablet 1   metoprolol succinate (TOPROL-XL) 50 MG XL tablet Take 1 tablet (50 mg total) by mouth 2 (two) times daily 180 tablet 3   multivitamin with minerals, EYE, (PRESERVISION AREDS 2) soft gel capsule Take 1 capsule by mouth 2 (two) times daily with meals     mupirocin (BACTROBAN) 2 % ointment Apply topically 3 (three) times daily As needed 22 g 2    nystatin (MYCOSTATIN) 100,000 unit/gram powder Apply topically 2 (two) times daily 60 g 3   omeprazole (PRILOSEC) 20 MG DR capsule Take 1 capsule (20 mg total) by mouth 2 (two) times daily 180 capsule 3   pen needle, diabetic, safety (NOVOFINE AUTOCOVER) 30 gauge x 1/3" Use 100 each once daily E11.9 100 each 6   propranoloL (INDERAL) 10 MG tablet Take 1 tablet (10 mg total) by mouth every evening 30 tablet 2   QUEtiapine (SEROQUEL) 50 MG tablet Take 1 tablet (50 mg total) by mouth at bedtime 30 tablet 2   dulaglutide (TRULICITY) 1.5 VH/8.4 mL subcutaneous pen injector Inject 0.5 mLs (1.5 mg total) subcutaneously every 7 (seven) days for 30 days (Patient not taking: Reported on 10/17/2021) 2 mL 5   peg-electrolyte (NULYTELY) solution Take 4,000 mLs by mouth as directed (Patient  not taking: No sig reported) 4000 mL 0   Current Facility-Administered Medications  Medication Dose Route Frequency Provider Last Rate Last Admin   cyanocobalamin (VITAMIN B12) injection 1,000 mcg  1,000 mcg Intramuscular Q30 Days Idelle Crouch, MD   1,000 mcg at 07/03/21 1158    ALLERGIES: Inhaler decongestant [l-desoxyephedrine], Other, Mobic [meloxicam], Oxycodone-acetaminophen, Penicillins, Antihistamines - alkylamine, Augmentin [amoxicillin-pot clavulanate], and Codeine  PAST MEDICAL HISTORY: Past Medical History:  Diagnosis Date   Anemia    Arthritis    Asthma without status asthmaticus, unspecified    DCIS (ductal carcinoma in situ) of breast, right    Gastropathy    reactive   GERD (gastroesophageal reflux disease)    Hashimoto's disease    History of blood clots    leg   History of chickenpox    History of MRSA infection    Hx of adenomatous colonic polyps 03/26/2016   Hx of colonic polyp    Hyperlipidemia    Hypertension    Macular degeneration    Morbid obesity (CMS-HCC)    Nontoxic uninodular goiter    substernal   Osteoarthrosis, unspecified whether generalized or localized, lower leg     Presence of artificial knee joint    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)    Type 2 diabetes mellitus (CMS-HCC)    hospitalization for severe hyperglycemia 02/2013   Vocal cord paralysis    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   Wegener's granulomatosis (CMS-HCC)     PAST SURGICAL HISTORY: Past Surgical History:  Procedure Laterality Date   ARTHROSCOPY KNEE Bilateral    CESAREAN SECTION     COLON SURGERY     polyp   COLONOSCOPY  01/27/2006   Adenomatous Polyp   COLONOSCOPY  04/09/2011   PH Adenomatous Polyp: CBF 03/2016; Recall Ltr mailed 02/21/2016 (dw)   COLONOSCOPY  08/04/2016   Adenomatous Polyp: CBF 07/2021   EGD  01/27/2006, 04/09/2011   No repeat per RTE   EGD  08/14/2016   Gastritis, Esophagitis: No repeat per RTE   Hysterectomy with BSO     KNEE ARTHROCENTESIS     Left total knee arthroplasty  04/21/2011   MASTECTOMY Right    ductal carcinoma,her tumor wads er/pr neg and taking radiation   Partial thyroidectomy     Right partial mastectomy  12/2013   Right total knee arthroplasty  09/20/2012   TONSILLECTOMY       FAMILY HISTORY: Family History  Problem Relation Age of Onset   Thyroid disease Mother    High blood pressure (Hypertension) Mother    Asthma Mother    Thrombosis Mother        blood clots   Heart disease Mother        heart condition   Clotting disorder Mother        clotting and bleeding problems   Arthritis Mother    Prostate cancer Father    Diabetes type II Father    High blood pressure (Hypertension) Father    Arthritis Father    Prostate cancer Brother    Arthritis Brother    Stroke Other    Arthritis Sister    Colon cancer Daughter    Alzheimer's disease Brother    Diabetes Brother    Prostate cancer Brother    Breast cancer Neg Hx    Ovarian cancer Neg Hx      SOCIAL HISTORY: Social History   Socioeconomic History   Marital status:  Married  Occupational History    Occupation: Retired  Tobacco Use   Smoking status: Never Smoker   Smokeless tobacco: Never Used  Scientific laboratory technician Use: Never used  Substance and Sexual Activity   Alcohol use: No    Alcohol/week: 0.0 standard drinks   Drug use: No   Sexual activity: Never    Partners: Male    PHYSICAL EXAM: Vitals:   10/17/21 1317  BP: 115/72  Pulse: 77   Body mass index is 32.95 kg/m. Weight: 89.8 kg (198 lb)   GENERAL: Alert, active, oriented x3  HEENT: Pupils equal reactive to light. Extraocular movements are intact. Sclera clear. Palpebral conjunctiva normal red color.Pharynx clear.  NECK: Supple with no palpable mass and no adenopathy.  LUNGS: Sound clear with no rales rhonchi or wheezes.  HEART: Regular rhythm S1 and S2 without murmur.  ABDOMEN: Soft and depressible, nontender with no palpable mass, no hepatomegaly.   GU: There is fullness of the right vulva with erythema.  There is a large fluid collection in the left buttock.  There is purulence with cellulitis.  The size of the abscess measures over 8 cm.  EXTREMITIES: Well-developed well-nourished symmetrical with no dependent edema.  NEUROLOGICAL: Awake alert oriented, facial expression symmetrical, moving all extremities.  REVIEW OF DATA: I have reviewed the following data today: Appointment on 10/08/2021  Component Date Value   Hemoglobin A1C 10/08/2021 8.0 (!)   Average Blood Glucose (C* 10/08/2021 183   Office Visit on 08/20/2021  Component Date Value   Color 08/20/2021 Yellow    Clarity 08/20/2021 Turbid (!)   Specific Gravity 08/20/2021 1.020    pH, Urine 08/20/2021 6.0    Protein, Urinalysis 08/20/2021 30  (!)   Glucose, Urinalysis 08/20/2021 100  (!)   Ketones, Urinalysis 08/20/2021 Trace (!)   Blood, Urinalysis 08/20/2021 Trace (!)   Nitrite, Urinalysis 08/20/2021 Positive (!)   Leukocyte Esterase, Urin* 08/20/2021 Large (!)   White Blood Cells, Urina* 08/20/2021 >50 (!)   Red Blood Cells, Urinaly*  08/20/2021 0-3    Bacteria, Urinalysis 08/20/2021 Rare (!)   Squamous Epithelial Cell* 08/20/2021 None Seen    Urine Culture, Routine -* 08/20/2021 Final report (!)   Result 1 - [facility] 08/20/2021 Comment (!)   Antimicrobial Susceptibi* 08/20/2021 Comment   Office Visit on 07/23/2021  Component Date Value   Vent Rate (bpm) 07/23/2021 69    PR Interval (msec) 07/23/2021 160    QRS Interval (msec) 07/23/2021 82    QT Interval (msec) 07/23/2021 418    QTc (msec) 07/23/2021 447      ASSESSMENT: Ms. Spanier is a 75 y.o. female presenting for consultation for left buttock abscess.  Patient endorses that she started with a left buttock abscess since a week ago.  This has been progressing and now she feels fullness on the right vulva.  There has been cellulitis.  Patient denies any fever.  There was tried to be drained 2 days ago but the patient reports that it is getting worse.  She was started on doxycycline without significant improvement either.  Due to the size of the abscess I think that it would be extremely uncomfortable to do a bedside procedure.  My recommendation will be to prepare the patient to the operation room for formal incision and drainage of complex abscess that extends from the buttock to the vulva.  At this moment I do not have any concern of crepitance or necrotizing fasciitis.  Patient will continue with  oral antibiotic therapy.  Patient agreed to proceed with surgery tomorrow.  Abscess of buttock, left [L02.31]  PLAN: Incision and drainage of left buttock and right vulvar abscess (10061, [id number])  Avoid taking any aspirin before the surgery Contact us if you have any concern.

## 2021-10-17 NOTE — H&P (Signed)
PATIENT PROFILE: Hailey Hawkins is a 75 y.o. female who presents to the Clinic for consultation at the request of  Tumey, PA for evaluation of abscess of the buttock.  PCP:  Idelle Crouch, MD  HISTORY OF PRESENT ILLNESS: Ms. Hailey Hawkins reports she has been feeling pain in the left buttock since a week ago.  The pain has been getting worse.  Pain has radiated to the genital area.  Aggravating factor is applying pressure.  There is no alleviating factors.  She went to see her PCP 2 days ago and a small incision was done and she was prescribed with doxycycline.  She understood that has been no significant improvement.  Today the pain is worse than 2 days ago.  She endorses having drainage.  She denies any fever or chills.  She denies any previous abscesses on this area.   PROBLEM LIST: Problem List  Date Reviewed: 09/11/2021          Noted   Ascending aorta dilatation (CMS-HCC) 01/08/2021   Overview    Noted on CT chest from 05/04/2020 3.9 cm stable mild      Nodule of upper lobe of right lung 05/06/2019   Nose dryness 03/21/2019   Type 2 diabetes mellitus with microalbuminuria, with long-term current use of insulin (CMS-HCC) 01/25/2019   Diabetes mellitus type 2 in obese (CMS-HCC) 01/25/2019   PR3 ANCA antibodies present 12/07/2018   Overview    p anca 12/1278 , pr3 ELEVATION 5.9 aNCA + 9.3 NEGATIVE C ANCA NEGATIVE ATYPICAL ANCA      Exertional dyspnea 12/07/2018   Chronic fatigue 12/07/2018   Abnormal CT of the chest 12/07/2018   Overview    RIGHT Upper lobe groundglass opacity      SOBOE (shortness of breath on exertion) 11/16/2018   Coronary artery disease involving native coronary artery of native heart 11/16/2018   Type II diabetes mellitus, uncontrolled 02/18/2018   Iron deficiency anemia 01/14/2018   Type 2 diabetes mellitus without complication, with long-term current use of insulin (CMS-HCC) 04/18/2016   Hx of adenomatous colonic polyps 03/26/2016   Mild aortic valve stenosis  02/07/2016   Chronic hyponatremia 07/05/2015   Acquired hypothyroidism, unspecified 03/06/2014   Pernicious anemia 03/06/2014   Nontoxic uninodular goiter 03/06/2014   Anemia, unspecified 03/06/2014   Essential hypertension, benign 03/06/2014   Diabetes mellitus without complication (CMS-HCC) 05/01/3605   Pure hypercholesterolemia 03/06/2014   Osteoarthrosis, unspecified whether generalized or localized, lower leg 03/06/2014   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       GENERAL REVIEW OF SYSTEMS:   General ROS: negative for - chills, fatigue, fever, weight gain or weight loss Allergy and Immunology ROS: negative for - hives  Hematological and Lymphatic ROS: negative for - bleeding problems or bruising, negative for palpable nodes Endocrine ROS: negative for - heat or cold intolerance, hair changes Respiratory ROS: negative for - cough, shortness of breath or wheezing Cardiovascular ROS: no chest pain or palpitations GI ROS: negative for nausea, vomiting, abdominal pain, diarrhea, constipation Musculoskeletal ROS: negative for - joint swelling or muscle pain Neurological ROS: negative for - confusion, syncope Dermatological ROS: negative for pruritus and rash Psychiatric: negative for anxiety, depression, difficulty sleeping and memory loss  MEDICATIONS: Current Outpatient Medications  Medication Sig Dispense Refill   acetaminophen (TYLENOL) 500 MG tablet Take 1 tablet (500 mg total) by mouth every 6 (six) hours as needed for Pain (Patient taking differently: Take 500 mg by mouth every 6 (six)  hours as needed for Pain Takes one tylenol every morning.) 180 tablet 3   ALPRAZolam (XANAX) 0.5 MG tablet Take 1 tablet (0.5 mg total) by mouth at bedtime as needed for Sleep 90 tablet 1   atorvastatin (LIPITOR) 10 MG tablet Take 1 tablet (10 mg total) by mouth once daily 90 tablet 1   benazepriL (LOTENSIN) 40 MG tablet Take 1 tablet (40 mg total) by mouth once daily 90 tablet 1   cloNIDine  HCL (CATAPRES) 0.2 MG tablet Take 1 tablet (0.2 mg total) by mouth 3 (three) times daily 270 tablet 3   clotrimazole (LOTRIMIN) 1 % cream Apply topically 2 (two) times daily 85 g 2   cyanocobalamin (VITAMIN B12) 1,000 mcg/mL injection Inject 1 mL (1,000 mcg total) into the muscle monthly. 10 mL 1   docusate (COLACE) 100 MG capsule Take 1 capsule (100 mg total) by mouth 2 (two) times daily 180 capsule 3   doxycycline (VIBRA-TABS) 100 MG tablet Take 1 tablet (100 mg total) by mouth 2 (two) times daily for 7 days 14 tablet 0   FREESTYLE LITE STRIPS test strip 2 (two) times daily Use as instructed. 200 strip 1   gabapentin (NEURONTIN) 300 MG capsule Take 1 capsule (300 mg total) by mouth 3 (three) times daily 90 capsule 2   hydrALAZINE (APRESOLINE) 50 MG tablet Take 1 tablet (50 mg total) by mouth 3 (three) times daily 270 tablet 3   insulin GLARGINE (LANTUS U-100 INSULIN) injection (concentration 100 units/mL) Inject 24 Units subcutaneously at bedtime 30 mL 2   insulin syringe-needle U-100 1 mL 31 gauge x 5/16 syringe Use once daily 100 each 2   isosorbide mononitrate (IMDUR) 30 MG ER tablet TAKE 1 TABLET(30 MG) BY MOUTH EVERY DAY 30 tablet 11   levothyroxine (SYNTHROID) 75 MCG tablet TAKE 1 TABLET(75 MCG) BY MOUTH EVERY DAY 30 TO 60 MINUTES BEFORE BREAKFAST ON AN EMPTY STOMACH AND WITH A GLASS OF WATER 30 tablet 11   magnesium oxide (MAG-OX) 400 mg (241.3 mg magnesium) tablet Take 1 tablet (400 mg total) by mouth once daily 90 tablet 3   metFORMIN (GLUCOPHAGE) 1000 MG tablet Take 1 tablet (1,000 mg total) by mouth 2 (two) times daily with meals 180 tablet 1   metoprolol succinate (TOPROL-XL) 50 MG XL tablet Take 1 tablet (50 mg total) by mouth 2 (two) times daily 180 tablet 3   multivitamin with minerals, EYE, (PRESERVISION AREDS 2) soft gel capsule Take 1 capsule by mouth 2 (two) times daily with meals     mupirocin (BACTROBAN) 2 % ointment Apply topically 3 (three) times daily As needed 22 g 2    nystatin (MYCOSTATIN) 100,000 unit/gram powder Apply topically 2 (two) times daily 60 g 3   omeprazole (PRILOSEC) 20 MG DR capsule Take 1 capsule (20 mg total) by mouth 2 (two) times daily 180 capsule 3   pen needle, diabetic, safety (NOVOFINE AUTOCOVER) 30 gauge x 1/3" Use 100 each once daily E11.9 100 each 6   propranoloL (INDERAL) 10 MG tablet Take 1 tablet (10 mg total) by mouth every evening 30 tablet 2   QUEtiapine (SEROQUEL) 50 MG tablet Take 1 tablet (50 mg total) by mouth at bedtime 30 tablet 2   dulaglutide (TRULICITY) 1.5 LY/6.5 mL subcutaneous pen injector Inject 0.5 mLs (1.5 mg total) subcutaneously every 7 (seven) days for 30 days (Patient not taking: Reported on 10/17/2021) 2 mL 5   peg-electrolyte (NULYTELY) solution Take 4,000 mLs by mouth as directed (Patient  not taking: No sig reported) 4000 mL 0   Current Facility-Administered Medications  Medication Dose Route Frequency Provider Last Rate Last Admin   cyanocobalamin (VITAMIN B12) injection 1,000 mcg  1,000 mcg Intramuscular Q30 Days Idelle Crouch, MD   1,000 mcg at 07/03/21 1158    ALLERGIES: Inhaler decongestant [l-desoxyephedrine], Other, Mobic [meloxicam], Oxycodone-acetaminophen, Penicillins, Antihistamines - alkylamine, Augmentin [amoxicillin-pot clavulanate], and Codeine  PAST MEDICAL HISTORY: Past Medical History:  Diagnosis Date   Anemia    Arthritis    Asthma without status asthmaticus, unspecified    DCIS (ductal carcinoma in situ) of breast, right    Gastropathy    reactive   GERD (gastroesophageal reflux disease)    Hashimoto's disease    History of blood clots    leg   History of chickenpox    History of MRSA infection    Hx of adenomatous colonic polyps 03/26/2016   Hx of colonic polyp    Hyperlipidemia    Hypertension    Macular degeneration    Morbid obesity (CMS-HCC)    Nontoxic uninodular goiter    substernal   Osteoarthrosis, unspecified whether generalized or localized, lower leg     Presence of artificial knee joint    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)    Type 2 diabetes mellitus (CMS-HCC)    hospitalization for severe hyperglycemia 02/2013   Vocal cord paralysis    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   Wegener's granulomatosis (CMS-HCC)     PAST SURGICAL HISTORY: Past Surgical History:  Procedure Laterality Date   ARTHROSCOPY KNEE Bilateral    CESAREAN SECTION     COLON SURGERY     polyp   COLONOSCOPY  01/27/2006   Adenomatous Polyp   COLONOSCOPY  04/09/2011   PH Adenomatous Polyp: CBF 03/2016; Recall Ltr mailed 02/21/2016 (dw)   COLONOSCOPY  08/04/2016   Adenomatous Polyp: CBF 07/2021   EGD  01/27/2006, 04/09/2011   No repeat per RTE   EGD  08/14/2016   Gastritis, Esophagitis: No repeat per RTE   Hysterectomy with BSO     KNEE ARTHROCENTESIS     Left total knee arthroplasty  04/21/2011   MASTECTOMY Right    ductal carcinoma,her tumor wads er/pr neg and taking radiation   Partial thyroidectomy     Right partial mastectomy  12/2013   Right total knee arthroplasty  09/20/2012   TONSILLECTOMY       FAMILY HISTORY: Family History  Problem Relation Age of Onset   Thyroid disease Mother    High blood pressure (Hypertension) Mother    Asthma Mother    Thrombosis Mother        blood clots   Heart disease Mother        heart condition   Clotting disorder Mother        clotting and bleeding problems   Arthritis Mother    Prostate cancer Father    Diabetes type II Father    High blood pressure (Hypertension) Father    Arthritis Father    Prostate cancer Brother    Arthritis Brother    Stroke Other    Arthritis Sister    Colon cancer Daughter    Alzheimer's disease Brother    Diabetes Brother    Prostate cancer Brother    Breast cancer Neg Hx    Ovarian cancer Neg Hx      SOCIAL HISTORY: Social History   Socioeconomic History   Marital status:  Married  Occupational History    Occupation: Retired  Tobacco Use   Smoking status: Never Smoker   Smokeless tobacco: Never Used  Scientific laboratory technician Use: Never used  Substance and Sexual Activity   Alcohol use: No    Alcohol/week: 0.0 standard drinks   Drug use: No   Sexual activity: Never    Partners: Male    PHYSICAL EXAM: Vitals:   10/17/21 1317  BP: 115/72  Pulse: 77   Body mass index is 32.95 kg/m. Weight: 89.8 kg (198 lb)   GENERAL: Alert, active, oriented x3  HEENT: Pupils equal reactive to light. Extraocular movements are intact. Sclera clear. Palpebral conjunctiva normal red color.Pharynx clear.  NECK: Supple with no palpable mass and no adenopathy.  LUNGS: Sound clear with no rales rhonchi or wheezes.  HEART: Regular rhythm S1 and S2 without murmur.  ABDOMEN: Soft and depressible, nontender with no palpable mass, no hepatomegaly.   GU: There is fullness of the right vulva with erythema.  There is a large fluid collection in the left buttock.  There is purulence with cellulitis.  The size of the abscess measures over 8 cm.  EXTREMITIES: Well-developed well-nourished symmetrical with no dependent edema.  NEUROLOGICAL: Awake alert oriented, facial expression symmetrical, moving all extremities.  REVIEW OF DATA: I have reviewed the following data today: Appointment on 10/08/2021  Component Date Value   Hemoglobin A1C 10/08/2021 8.0 (!)   Average Blood Glucose (C* 10/08/2021 183   Office Visit on 08/20/2021  Component Date Value   Color 08/20/2021 Yellow    Clarity 08/20/2021 Turbid (!)   Specific Gravity 08/20/2021 1.020    pH, Urine 08/20/2021 6.0    Protein, Urinalysis 08/20/2021 30  (!)   Glucose, Urinalysis 08/20/2021 100  (!)   Ketones, Urinalysis 08/20/2021 Trace (!)   Blood, Urinalysis 08/20/2021 Trace (!)   Nitrite, Urinalysis 08/20/2021 Positive (!)   Leukocyte Esterase, Urin* 08/20/2021 Large (!)   White Blood Cells, Urina* 08/20/2021 >50 (!)   Red Blood Cells, Urinaly*  08/20/2021 0-3    Bacteria, Urinalysis 08/20/2021 Rare (!)   Squamous Epithelial Cell* 08/20/2021 None Seen    Urine Culture, Routine -* 08/20/2021 Final report (!)   Result 1 - [facility] 08/20/2021 Comment (!)   Antimicrobial Susceptibi* 08/20/2021 Comment   Office Visit on 07/23/2021  Component Date Value   Vent Rate (bpm) 07/23/2021 69    PR Interval (msec) 07/23/2021 160    QRS Interval (msec) 07/23/2021 82    QT Interval (msec) 07/23/2021 418    QTc (msec) 07/23/2021 447      ASSESSMENT: Ms. Banghart is a 75 y.o. female presenting for consultation for left buttock abscess.  Patient endorses that she started with a left buttock abscess since a week ago.  This has been progressing and now she feels fullness on the right vulva.  There has been cellulitis.  Patient denies any fever.  There was tried to be drained 2 days ago but the patient reports that it is getting worse.  She was started on doxycycline without significant improvement either.  Due to the size of the abscess I think that it would be extremely uncomfortable to do a bedside procedure.  My recommendation will be to prepare the patient to the operation room for formal incision and drainage of complex abscess that extends from the buttock to the vulva.  At this moment I do not have any concern of crepitance or necrotizing fasciitis.  Patient will continue with  oral antibiotic therapy.  Patient agreed to proceed with surgery tomorrow.  Abscess of buttock, left [L02.31]  PLAN: Incision and drainage of left buttock and right vulvar abscess (10061, 83507)  Avoid taking any aspirin before the surgery Contact us if you have any concern.

## 2021-10-18 ENCOUNTER — Encounter
Admission: RE | Disposition: A | Discharge status: Home / Self Care | Payer: Self-pay | Source: Home / Self Care | Attending: General Surgery

## 2021-10-18 ENCOUNTER — Other Ambulatory Visit: Payer: Self-pay

## 2021-10-18 ENCOUNTER — Ambulatory Visit: Payer: Medicare Other | Admitting: Anesthesiology

## 2021-10-18 ENCOUNTER — Ambulatory Visit
Admission: RE | Admit: 2021-10-18 | Discharge: 2021-10-18 | Disposition: A | Discharge status: Home / Self Care | Payer: Medicare Other | Attending: General Surgery | Admitting: General Surgery

## 2021-10-18 ENCOUNTER — Encounter: Payer: Self-pay | Admitting: General Surgery

## 2021-10-18 DIAGNOSIS — L0231 Cutaneous abscess of buttock: Secondary | ICD-10-CM | POA: Insufficient documentation

## 2021-10-18 DIAGNOSIS — B488 Other specified mycoses: Secondary | ICD-10-CM | POA: Diagnosis not present

## 2021-10-18 DIAGNOSIS — N764 Abscess of vulva: Secondary | ICD-10-CM | POA: Insufficient documentation

## 2021-10-18 DIAGNOSIS — Z6833 Body mass index (BMI) 33.0-33.9, adult: Secondary | ICD-10-CM | POA: Insufficient documentation

## 2021-10-18 HISTORY — PX: INCISION AND DRAINAGE ABSCESS: SHX5864

## 2021-10-18 LAB — GLUCOSE, CAPILLARY
Glucose-Capillary: 146 mg/dL — ABNORMAL HIGH (ref 70–99)
Glucose-Capillary: 134 mg/dL — ABNORMAL HIGH (ref 70–99)

## 2021-10-18 SURGERY — INCISION AND DRAINAGE, ABSCESS
Anesthesia: General | Site: Buttocks | Laterality: Left

## 2021-10-18 MED ORDER — ACETAMINOPHEN 10 MG/ML IV SOLN
INTRAVENOUS | Status: DC | PRN
Start: 2021-10-18 — End: 2021-10-18
  Administered 2021-10-18: 1000 mg via INTRAVENOUS

## 2021-10-18 MED ORDER — PROPOFOL 10 MG/ML IV BOLUS
INTRAVENOUS | Status: DC | PRN
  Administered 2021-10-18: 120 mg via INTRAVENOUS

## 2021-10-18 MED ORDER — TRAMADOL HCL 50 MG PO TABS
ORAL_TABLET | ORAL | Status: AC
  Filled 2021-10-18: qty 1

## 2021-10-18 MED ORDER — CLINDAMYCIN PHOSPHATE 900 MG/50ML IV SOLN
INTRAVENOUS | Status: AC
  Filled 2021-10-18: qty 50

## 2021-10-18 MED ORDER — CHLORHEXIDINE GLUCONATE 0.12 % MT SOLN
OROMUCOSAL | Status: AC
  Administered 2021-10-18: 15 mL via OROMUCOSAL
  Filled 2021-10-18: qty 15

## 2021-10-18 MED ORDER — ONDANSETRON HCL 4 MG/2ML IJ SOLN
INTRAMUSCULAR | Status: DC | PRN
  Administered 2021-10-18: 4 mg via INTRAVENOUS

## 2021-10-18 MED ORDER — FENTANYL CITRATE (PF) 100 MCG/2ML IJ SOLN
INTRAMUSCULAR | Status: AC
  Filled 2021-10-18: qty 2

## 2021-10-18 MED ORDER — CHLORHEXIDINE GLUCONATE 0.12 % MT SOLN: 15.0000 mL | Freq: Once | OROMUCOSAL | Status: AC

## 2021-10-18 MED ORDER — ACETAMINOPHEN 10 MG/ML IV SOLN
INTRAVENOUS | Status: AC
  Filled 2021-10-18: qty 100

## 2021-10-18 MED ORDER — FENTANYL CITRATE (PF) 100 MCG/2ML IJ SOLN
INTRAMUSCULAR | Status: DC | PRN
  Administered 2021-10-18: 50 ug via INTRAVENOUS

## 2021-10-18 MED ORDER — FENTANYL CITRATE (PF) 100 MCG/2ML IJ SOLN
25.0000 ug | INTRAMUSCULAR | Status: DC | PRN
  Administered 2021-10-18: 25 ug via INTRAVENOUS

## 2021-10-18 MED ORDER — ONDANSETRON HCL 4 MG/2ML IJ SOLN: 4.0000 mg | Freq: Once | INTRAMUSCULAR | Status: DC | PRN

## 2021-10-18 MED ORDER — MIDAZOLAM HCL 2 MG/2ML IJ SOLN
INTRAMUSCULAR | Status: DC | PRN
  Administered 2021-10-18: 1 mg via INTRAVENOUS

## 2021-10-18 MED ORDER — MIDAZOLAM HCL 2 MG/2ML IJ SOLN
INTRAMUSCULAR | Status: AC
  Filled 2021-10-18: qty 2

## 2021-10-18 MED ORDER — LIDOCAINE HCL (CARDIAC) PF 100 MG/5ML IV SOSY
PREFILLED_SYRINGE | INTRAVENOUS | Status: DC | PRN
  Administered 2021-10-18: 50 mg via INTRAVENOUS

## 2021-10-18 MED ORDER — SULFAMETHOXAZOLE-TRIMETHOPRIM 800-160 MG PO TABS: 1.0000 | ORAL_TABLET | Freq: Two times a day (BID) | ORAL | 0 refills | Status: DC

## 2021-10-18 MED ORDER — DEXAMETHASONE SODIUM PHOSPHATE 10 MG/ML IJ SOLN
INTRAMUSCULAR | Status: DC | PRN
  Administered 2021-10-18: 5 mg via INTRAVENOUS

## 2021-10-18 MED ORDER — MEPERIDINE HCL 25 MG/ML IJ SOLN: 6.2500 mg | INTRAMUSCULAR | Status: DC | PRN

## 2021-10-18 MED ORDER — METRONIDAZOLE 500 MG PO TABS: 500.0000 mg | ORAL_TABLET | Freq: Three times a day (TID) | ORAL | 0 refills | Status: AC

## 2021-10-18 MED ORDER — FLUCONAZOLE 100 MG PO TABS: 100.0000 mg | ORAL_TABLET | Freq: Every day | ORAL | 0 refills | Status: AC

## 2021-10-18 MED ORDER — TRAMADOL HCL 50 MG PO TABS
50.0000 mg | ORAL_TABLET | Freq: Four times a day (QID) | ORAL | Status: AC | PRN
  Administered 2021-10-18: 50 mg via ORAL

## 2021-10-18 MED ORDER — ORAL CARE MOUTH RINSE: 15.0000 mL | Freq: Once | OROMUCOSAL | Status: AC

## 2021-10-18 MED ORDER — TRAMADOL HCL 50 MG PO TABS: 50.0000 mg | ORAL_TABLET | Freq: Four times a day (QID) | ORAL | 0 refills | Status: AC | PRN

## 2021-10-18 MED ORDER — 0.9 % SODIUM CHLORIDE (POUR BTL) OPTIME
TOPICAL | Status: DC | PRN
  Administered 2021-10-18: 1000 mL

## 2021-10-18 MED ORDER — CLINDAMYCIN PHOSPHATE 900 MG/50ML IV SOLN: 900.0000 mg | INTRAVENOUS | Status: DC

## 2021-10-18 MED ORDER — LACTATED RINGERS IV SOLN: INTRAVENOUS | Status: DC | PRN

## 2021-10-18 MED ORDER — SODIUM CHLORIDE 0.9 % IV SOLN: INTRAVENOUS | Status: DC

## 2021-10-18 SURGICAL SUPPLY — 28 items
BLADE CLIPPER SURG (BLADE) IMPLANT
BLADE SURG 15 STRL LF DISP TIS (BLADE) ×1 IMPLANT
BLADE SURG 15 STRL SS (BLADE) ×2
BRUSH SCRUB EZ  4% CHG (MISCELLANEOUS) ×1
BRUSH SCRUB EZ 4% CHG (MISCELLANEOUS) ×1 IMPLANT
DRAPE 3/4 80X56 (DRAPES) ×2 IMPLANT
DRAPE CHEST BREAST 77X106 FENE (MISCELLANEOUS) IMPLANT
DRAPE LAPAROTOMY 77X122 PED (DRAPES) IMPLANT
DRAPE UNDER BUTTOCK W/FLU (DRAPES) ×2 IMPLANT
ELECT REM PT RETURN 9FT ADLT (ELECTROSURGICAL) ×2
ELECTRODE REM PT RTRN 9FT ADLT (ELECTROSURGICAL) ×1 IMPLANT
GAUZE 4X4 16PLY ~~LOC~~+RFID DBL (SPONGE) ×4 IMPLANT
GAUZE PACKING IODOFORM 1X5 (PACKING) ×2 IMPLANT
GLOVE SURG ENC MOIS LTX SZ6.5 (GLOVE) ×2 IMPLANT
GLOVE SURG UNDER POLY LF SZ6.5 (GLOVE) ×2 IMPLANT
GOWN STRL REUS W/ TWL LRG LVL3 (GOWN DISPOSABLE) ×2 IMPLANT
GOWN STRL REUS W/TWL LRG LVL3 (GOWN DISPOSABLE) ×4
HYDROGEN PEROXIDE 16OZ (MISCELLANEOUS) ×2 IMPLANT
LEGGING LITHOTOMY PAIR STRL (DRAPES) ×2 IMPLANT
MANIFOLD NEPTUNE II (INSTRUMENTS) ×2 IMPLANT
NEEDLE HYPO 22GX1.5 SAFETY (NEEDLE) ×2 IMPLANT
NS IRRIG 1000ML POUR BTL (IV SOLUTION) ×2 IMPLANT
PACK BASIN MINOR ARMC (MISCELLANEOUS) ×2 IMPLANT
SOL PREP PVP 2OZ (MISCELLANEOUS) ×4
SOLUTION PREP PVP 2OZ (MISCELLANEOUS) ×2 IMPLANT
SPONGE T-LAP 18X18 ~~LOC~~+RFID (SPONGE) ×2 IMPLANT
SWAB CULTURE ESWAB REG 1ML (MISCELLANEOUS) ×4 IMPLANT
WATER STERILE IRR 500ML POUR (IV SOLUTION) IMPLANT

## 2021-10-18 NOTE — Op Note (Signed)
Preoperative diagnosis: Left buttock abscess                                            Right vulvar abscess  Postoperative diagnosis: Left buttock abscess                                              Right vulvar abscess  Procedure: Incision and drainage of left buttock abscess Incision and drainage of right vulvar abscess.  Anesthesia: General  Surgeon: Dr. Windell Moment, MD  Wound Classification: Contaminated  Indications: Patient is a 75 y.o. female  presented with an abscess on the left buttock and right vulvar  Findings:  1. Abscess on the left buttock and right vulvar 2. Abundant purulent secretions on both abscess drained and cultured 3. No connection between the two abscesses.  4. Extensive fungal infection 5. Adequate hemostasis.   Description of procedure: The patient was placed in the lithotomy position and general anesthesia was induced. The vaginal and buttock area was prepped and draped in the usual sterile fashion. A timeout was completed verifying correct patient, procedure, site, positioning, and implant(s) and/or special equipment prior to beginning this procedure.  A skin incision was made over the large left buttock palpable abscess. The wound was opened and purulent secretions was drained. With a hemostat blunt dissection of septas was done to be able to drain the abscess completely. The cavity extended to the left vulvar area but it did not communicated with the right vulvar abscess.  The cavity was irrigated with peroxide and saline mixtures. The cavity flushed with saline until clear saline was aspirated.  Then, attention was taken to the right vulvar area were a second abscess was identified. The right vulvar abscess was opened with an elliptical incision. Purulent secretions drained. Septa were broken with hemostat. Cavity irrigated with peroxide and saline solution.  Both wounds were packed with an iodine packing and wounds left opened. Sterile dressing left in  place.  The patient tolerated the procedure well and was taken to the postanesthesia care unit in satisfactory condition.   Specimen: Left vulvar abscess for fungal culture  Complications: None  Estimated Blood Loss: 5 mL

## 2021-10-18 NOTE — Transfer of Care (Signed)
Immediate Anesthesia Transfer of Care Note  Patient: Hailey Hawkins  Procedure(s) Performed: INCISION AND DRAINAGE ABSCESS (Left: Buttocks)  Patient Location: PACU  Anesthesia Type:General  Level of Consciousness: awake  Airway & Oxygen Therapy: Patient Spontanous Breathing  Post-op Assessment: Report given to RN  Post vital signs: Reviewed  Last Vitals:  Vitals Value Taken Time  BP    Temp    Pulse 79 10/18/21 1059  Resp 14 10/18/21 1059  SpO2 100 % 10/18/21 1059  Vitals shown include unvalidated device data.  Last Pain:  Vitals:   10/18/21 0923  TempSrc: Oral  PainSc: 5          Complications: No notable events documented.

## 2021-10-18 NOTE — Anesthesia Preprocedure Evaluation (Signed)
Anesthesia Evaluation  Patient identified by MRN, date of birth, ID band Patient awake    Reviewed: Allergy & Precautions, NPO status , Patient's Chart, lab work & pertinent test results, reviewed documented beta blocker date and time   Airway Mallampati: III  TM Distance: >3 FB Neck ROM: Full    Dental  (+) Poor Dentition   Pulmonary asthma ,  Oxygen at night   Pulmonary exam normal        Cardiovascular hypertension, Pt. on medications and Pt. on home beta blockers + CAD (Noted On CT Scan)  Normal cardiovascular exam     Neuro/Psych negative neurological ROS  negative psych ROS   GI/Hepatic Neg liver ROS, GERD  Medicated,  Endo/Other  diabetes, Well Controlled, Insulin DependentHypothyroidism   Renal/GU negative Renal ROS  negative genitourinary   Musculoskeletal  (+) Arthritis ,   Abdominal   Peds negative pediatric ROS (+)  Hematology negative hematology ROS (+) anemia ,   Anesthesia Other Findings . Anemia  . Arthritis  . Asthma without status asthmaticus, unspecified  . DCIS (ductal carcinoma in situ) of breast, right  . Gastropathy  reactive  . GERD (gastroesophageal reflux disease)  . Hashimoto's disease  . History of blood clots  leg  . History of chickenpox  . History of MRSA infection  . Hx of adenomatous colonic polyps 03/26/2016  . Hx of colonic polyp  . Hyperlipidemia  . Hypertension  . Macular degeneration  . Morbid obesity (CMS-HCC)  . Nontoxic uninodular goiter  substernal  . Osteoarthrosis, unspecified whether generalized or localized, lower leg  . Presence of artificial knee joint  . 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 Type 2 diabetes mellitus (CMS-HCC)  hospitalization for severe hyperglycemia 02/2013  . 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 Wegener's granulomatosis (CMS-HCC)      Reproductive/Obstetrics negative OB ROS                             Anesthesia Physical Anesthesia Plan  ASA: 3  Anesthesia Plan: General   Post-op Pain Management:    Induction: Intravenous  PONV Risk Score and Plan: 2 and Propofol infusion, Ondansetron and Midazolam  Airway Management Planned: LMA and Oral ETT  Additional Equipment:   Intra-op Plan:   Post-operative Plan: Extubation in OR  Informed Consent: I have reviewed the patients History and Physical, chart, labs and discussed the procedure including the risks, benefits and alternatives for the proposed anesthesia with the patient or authorized representative who has indicated his/her understanding and acceptance.       Plan Discussed with: CRNA, Surgeon and Anesthesiologist  Anesthesia Plan Comments: (LMA if supine)        Anesthesia Quick Evaluation

## 2021-10-18 NOTE — Discharge Instructions (Addendum)
  Diet: Resume home heart healthy regular diet.   Activity: Increase activity as tolerated. Light activity and walking are encouraged. Do not drive or drink alcohol if taking narcotic pain medications.  Wound care: Remove dressing tomorrow. Once dressing removed, may shower with soapy water and pat dry (do not rub incisions), but no baths or submerging incision underwater until follow-up. (no swimming). Home care was coordinated. May change dressing personally if the dressing is soaked until home care nurse goes to change the packing.    Medications: Resume all home medications. For mild to moderate pain: acetaminophen (Tylenol) or ibuprofen (if no kidney disease). Combining Tylenol with alcohol can substantially increase your risk of causing liver disease. Narcotic pain medications, if prescribed, can be used for severe pain, though may cause nausea, constipation, and drowsiness. If you do not need the narcotic pain medication, you do not need to fill the prescription.  Stop taking doxycycline. Start new antibiotics as prescribed.   Call office [phone]) at any time if any questions, worsening pain, fevers/chills, bleeding, drainage from incision site, or other concerns.   AMBULATORY SURGERY  DISCHARGE INSTRUCTIONS   The drugs that you were given will stay in your system until tomorrow so for the next 24 hours you should not:  Drive an automobile Make any legal decisions Drink any alcoholic beverage   You may resume regular meals tomorrow.  Today it is better to start with liquids and gradually work up to solid foods.  You may eat anything you prefer, but it is better to start with liquids, then soup and crackers, and gradually work up to solid foods.   Please notify your doctor immediately if you have any unusual bleeding, trouble breathing, redness and pain at the surgery site, drainage, fever, or pain not relieved by medication.     Please contact your physician with any  problems or Same Day Surgery at [phone], Monday through Friday 6 am to 4 pm, or [place] [facility] number at [phone].

## 2021-10-18 NOTE — Anesthesia Procedure Notes (Signed)
Procedure Name: LMA Insertion Date/Time: 10/18/2021 10:47 AM Performed by: Timoteo Expose, CRNA Pre-anesthesia Checklist: Patient identified, Suction available, Emergency Drugs available, Patient being monitored and Timeout performed Patient Re-evaluated:Patient Re-evaluated prior to induction Oxygen Delivery Method: Circle system utilized Preoxygenation: Pre-oxygenation with 100% oxygen Induction Type: IV induction Ventilation: Mask ventilation without difficulty LMA: LMA inserted LMA Size: 4.0

## 2021-10-18 NOTE — Interval H&P Note (Signed)
Patient present for incision and drainage of buttock and vulvar abscess. No change on physical exam since last evaluation yesterday. Patient agrees to proceed.

## 2021-10-18 NOTE — Anesthesia Postprocedure Evaluation (Signed)
Anesthesia Post Note  Patient: Hailey Hawkins  Procedure(s) Performed: INCISION AND DRAINAGE ABSCESS (Left: Buttocks)  Patient location during evaluation: PACU Anesthesia Type: General Level of consciousness: awake and alert, awake and oriented Pain management: pain level controlled Vital Signs Assessment: post-procedure vital signs reviewed and stable Respiratory status: spontaneous breathing, nonlabored ventilation and respiratory function stable Cardiovascular status: blood pressure returned to baseline and stable Postop Assessment: no apparent nausea or vomiting Anesthetic complications: no   No notable events documented.   Last Vitals:  Vitals:   10/18/21 1129 10/18/21 1156  BP:  (!) 146/76  Pulse:    Resp:  16  Temp: 36.5 C   SpO2:  98%    Last Pain:  Vitals:   10/18/21 1156  TempSrc: Tympanic  PainSc: 2                  Phill Mutter

## 2021-10-19 ENCOUNTER — Encounter: Payer: Self-pay | Admitting: General Surgery

## 2021-10-23 LAB — AEROBIC/ANAEROBIC CULTURE W GRAM STAIN (SURGICAL/DEEP WOUND)
Gram Stain: NONE SEEN
Gram Stain: NONE SEEN

## 2021-11-21 LAB — FUNGUS CULTURE WITH STAIN

## 2021-11-21 LAB — FUNGUS CULTURE RESULT

## 2021-11-21 LAB — FUNGAL ORGANISM REFLEX

## 2021-12-03 ENCOUNTER — Encounter: Admission: RE | Payer: Self-pay | Source: Home / Self Care

## 2021-12-03 ENCOUNTER — Ambulatory Visit: Admission: RE | Admit: 2021-12-03 | Payer: Medicare Other | Source: Home / Self Care

## 2021-12-03 SURGERY — ESOPHAGOGASTRODUODENOSCOPY (EGD) WITH PROPOFOL
Anesthesia: General

## 2022-01-09 ENCOUNTER — Other Ambulatory Visit: Payer: Self-pay | Admitting: Internal Medicine

## 2022-01-09 DIAGNOSIS — Z1231 Encounter for screening mammogram for malignant neoplasm of breast: Secondary | ICD-10-CM

## 2022-03-04 ENCOUNTER — Other Ambulatory Visit: Payer: Self-pay | Admitting: *Deleted

## 2022-03-04 DIAGNOSIS — D509 Iron deficiency anemia, unspecified: Secondary | ICD-10-CM

## 2022-03-04 DIAGNOSIS — D0511 Intraductal carcinoma in situ of right breast: Secondary | ICD-10-CM

## 2022-03-10 ENCOUNTER — Other Ambulatory Visit: Payer: Medicare Other

## 2022-03-10 ENCOUNTER — Ambulatory Visit
Admission: RE | Admit: 2022-03-10 | Discharge: 2022-03-10 | Disposition: A | Discharge status: Home / Self Care | Payer: Medicare Other | Source: Ambulatory Visit | Attending: Internal Medicine | Admitting: Internal Medicine

## 2022-03-10 ENCOUNTER — Other Ambulatory Visit: Payer: Self-pay

## 2022-03-10 ENCOUNTER — Ambulatory Visit: Payer: Medicare Other | Admitting: Hematology and Oncology

## 2022-03-10 DIAGNOSIS — Z1231 Encounter for screening mammogram for malignant neoplasm of breast: Secondary | ICD-10-CM | POA: Insufficient documentation

## 2022-03-11 ENCOUNTER — Inpatient Hospital Stay: Payer: Medicare Other | Attending: Hematology and Oncology | Admitting: Nurse Practitioner

## 2022-03-11 ENCOUNTER — Inpatient Hospital Stay: Payer: Medicare Other

## 2022-03-11 ENCOUNTER — Encounter: Payer: Self-pay | Admitting: Nurse Practitioner

## 2022-03-11 VITALS — BP 155/86 | HR 74 | Temp 96.3°F | Resp 19 | Wt 207.6 lb

## 2022-03-11 DIAGNOSIS — D509 Iron deficiency anemia, unspecified: Secondary | ICD-10-CM

## 2022-03-11 DIAGNOSIS — Z800 Family history of malignant neoplasm of digestive organs: Secondary | ICD-10-CM | POA: Insufficient documentation

## 2022-03-11 DIAGNOSIS — Z86000 Personal history of in-situ neoplasm of breast: Secondary | ICD-10-CM | POA: Diagnosis not present

## 2022-03-11 DIAGNOSIS — R911 Solitary pulmonary nodule: Secondary | ICD-10-CM

## 2022-03-11 DIAGNOSIS — E222 Syndrome of inappropriate secretion of antidiuretic hormone: Secondary | ICD-10-CM | POA: Diagnosis not present

## 2022-03-11 DIAGNOSIS — D0511 Intraductal carcinoma in situ of right breast: Secondary | ICD-10-CM

## 2022-03-11 LAB — CBC WITH DIFFERENTIAL/PLATELET
Abs Immature Granulocytes: 0.01 10*3/uL (ref 0.00–0.07)
Basophils Absolute: 0 10*3/uL (ref 0.0–0.1)
Basophils Relative: 1 %
Eosinophils Absolute: 0.2 10*3/uL (ref 0.0–0.5)
Eosinophils Relative: 3 %
HCT: 34.7 % — ABNORMAL LOW (ref 36.0–46.0)
Hemoglobin: 11.4 g/dL — ABNORMAL LOW (ref 12.0–15.0)
Immature Granulocytes: 0 %
Lymphocytes Relative: 26 %
Lymphs Abs: 1.5 10*3/uL (ref 0.7–4.0)
MCH: 28.4 pg (ref 26.0–34.0)
MCHC: 32.9 g/dL (ref 30.0–36.0)
MCV: 86.5 fL (ref 80.0–100.0)
Monocytes Absolute: 0.4 10*3/uL (ref 0.1–1.0)
Monocytes Relative: 8 %
Neutro Abs: 3.5 10*3/uL (ref 1.7–7.7)
Neutrophils Relative %: 62 %
Platelets: 225 10*3/uL (ref 150–400)
RBC: 4.01 MIL/uL (ref 3.87–5.11)
RDW: 12.3 % (ref 11.5–15.5)
WBC: 5.7 10*3/uL (ref 4.0–10.5)
nRBC: 0 % (ref 0.0–0.2)

## 2022-03-11 LAB — COMPREHENSIVE METABOLIC PANEL
ALT: 13 U/L (ref 0–44)
AST: 25 U/L (ref 15–41)
Albumin: 3.5 g/dL (ref 3.5–5.0)
Alkaline Phosphatase: 57 U/L (ref 38–126)
Anion gap: 7 (ref 5–15)
BUN: 10 mg/dL (ref 8–23)
CO2: 25 mmol/L (ref 22–32)
Calcium: 8.9 mg/dL (ref 8.9–10.3)
Chloride: 100 mmol/L (ref 98–111)
Creatinine, Ser: 0.5 mg/dL (ref 0.44–1.00)
GFR, Estimated: >60 mL/min (ref >60)
Glucose, Bld: 190 mg/dL — ABNORMAL HIGH (ref 70–99)
Potassium: 4.3 mmol/L (ref 3.5–5.1)
Sodium: 132 mmol/L — ABNORMAL LOW (ref 135–145)
Total Bilirubin: 0.7 mg/dL (ref 0.3–1.2)
Total Protein: 7.6 g/dL (ref 6.5–8.1)

## 2022-03-11 LAB — IRON AND TIBC
Iron: 73 ug/dL (ref 28–170)
Saturation Ratios: 25 % (ref 10.4–31.8)
TIBC: 287 ug/dL (ref 250–450)
UIBC: 214 ug/dL

## 2022-03-11 LAB — FERRITIN: Ferritin: 28 ng/mL (ref 11–307)

## 2022-03-11 NOTE — Progress Notes (Signed)
?[facility]  ?Hematology/Oncology Progress Note ? ? ?Clinic Day:  03/11/2022 ? ?Referring physician: Idelle Crouch, MD ? 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  ? ?HPI: Hailey Hawkins is a 76 y.o. 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  Pathology revealed a 1.4 cm focus of grade 3 comedo type DCIS with microcalcifications and desmoplastic stromal response.  There was no evidence of invasive carcinoma. Margins were clear.  Tumor was ER was negative (< 1%) and PR was negative (0%).  She completed radiation to her right breast on 04/04/2014.  ?  ?Mammogram on 12/17/2015 revealed a new group of 6 mm calcifications of the right breast which appeared around a central lucency suggestive of fat necrosis.  Right-sided mammogram on 06/17/2016 revealed calcifications within the medial right breasts benign consistent with evolving fat necrosis.  Bilateral mammogram on 01/05/2019 revealed no evidence of malignancy.  Bilateral screening mammogram on 03/05/2020 revealed no evidence of malignancy. ?  ?Chest CT on 11/03/2018 revealed an 11 mm groundglass opacity in the peripheral RUL mildly increased in size since 2012.  Indolent pulmonary adenocarcinoma could not be excluded.  Gastrohepatic ligament and retrocrural lymph nodes appear increased in size since CT imaging in 2012, but remained normal by size criteria. ? ?Chest CT on 05/11/2019 revealed a 6 mm right upper lobe pulmonary nodule with no significant change compared to 11/03/2018, although once noted to show minimal increase in size compared to 2012 imaging.  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  ?  ?Chest CT with contrast on 05/04/2020 revealed no acute findings or explanation for the patient's symptoms. There were stable small pulmonary nodules bilaterally, likely benign based on stability. There were no enlarging or new  pulmonary nodules. There was aortic atherosclerosis with stable mild dilatation of the ascending aorta. ? ?She has a history of iron deficiency.  She is on oral iron.  EGD on 08/14/2016 revealed a small hiatal hernia and a discolored mucosa in the gastric body (iron pill gastritis).  Esophagus biopsy revealed reflux without dysplasia or malignancy.  Stomach polyp was hyperplastic and negative for H pylori, dysplasia or malignancy.  Colonoscopy on 08/14/2016 revealed 1 diminutive polyp in the descending colon, removed with forceps.  Pathology revealed a tubular adenoma which was negative for dysplasia or malignancy. ?  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  ? ? ?Interval History: Patient is 76 year old female with above history of dcis who returns to clinic for continued surveillance. She feels well and denies breast lumps or bumps. She denies lumps or bumps, breast pain, skin changes. Denies black or bloody stools. Says she feels well.  ? ? ? ?Past Medical History:  ?Diagnosis Date  ? Anemia   ? pernicious  ? Asthma   ? Back pain   ? Breast cancer ([place]) 2015  ? right c lumpectomy c radiation  ? DCIS (ductal carcinoma in situ) of breast   ? Diabetes mellitus without complication ([place])   ? Elevated cholesterol   ? GERD (gastroesophageal reflux disease)   ? Hashimoto's disease   ? Hypertension   ? Hypothyroidism   ? MRSA infection   ? Obesity goither  ? Personal history of radiation therapy 2015  ? right breast DCIS  ? SIADH (syndrome of inappropriate ADH production) ([place])   ? SOB (shortness of breath)   ? Tachycardia   ? ? ?  Past Surgical History:  ?Procedure Laterality Date  ? ABDOMINAL HYSTERECTOMY    ? BREAST BIOPSY Right 11/2013  ? DCIS  ? BREAST LUMPECTOMY Right 2015  ? DCIS, clear margins  ? CESAREAN SECTION    ? x2  ? colononoscopy    ? COLONOSCOPY N/A 08/14/2016  ? Procedure: COLONOSCOPY;  Surgeon: Manya Silvas, MD;  Location[facility] END[facility]COPY;  Service: Endoscopy;  Laterality: N/A;  ? COLONOSCOPY  WITH ESOPHAGOGASTRODUODENOSCOPY (EGD)    ? ESOPHAGOGASTRODUODENOSCOPY (EGD) WITH PROPOFOL N/A 08/14/2016  ? Procedure: ESOPHAGOGASTRODUODENOSCOPY (EGD) WITH PROPOFOL;  Surgeon: Manya Silvas, MD;  Location: [facility] ENDOSCOPY;  Service: Endoscopy;  Laterality: N/A;  ? INCISION AND DRAINAGE ABSCESS Left 10/18/2021  ? Procedure: INCISION AND DRAINAGE ABSCESS;  Surgeon: Herbert Pun, MD;  Location: [facility] ORS;  Service: General;  Laterality: Left;  Right labia  ? MASTECTOMY    ? MASTECTOMY, PARTIAL Right   ? REPLACEMENT TOTAL KNEE Right   ? REPLACEMENT TOTAL KNEE Left   ? THYROIDECTOMY, PARTIAL    ? caused throat paralysis  right side  ? TONSILLECTOMY    ? ? ?Family History  ?Problem Relation Age of Onset  ? Cancer Daughter   ?     colon cancer; age 102  ? Colon cancer Daughter   ? Breast cancer Neg Hx   ? ? ?Social History:  reports that she has never smoked. She has never used smokeless tobacco. She reports that she does not drink alcohol and does not use drugs. The patient lives in [place]. ? ?Allergies:  ?Allergies  ?Allergen Reactions  ? Methamphetamine Shortness Of Breath  ?  Difficulty breathing (INHALER)  ? Amoxicillin   ?  Other reaction(s): Diarrhea and vomiting (finding)  ? Antihistamines, Chlorpheniramine-Type   ?  Other reaction(s): Other (See Comments) ?Severe dryness  ? Ciprofloxacin   ?  Other reaction(s): Distress (finding)  ? Codeine   ?  Other reaction(s): Diarrhea and vomiting (finding), Weal ?or any derivitive  ? Other Other (See Comments)  ?  Birth control pills - blood clots ?Birth control pills - blood clots  ? Oxycodone-Acetaminophen Nausea And Vomiting  ? Penicillin G   ?  Other reaction(s): Weal  ? Meloxicam Palpitations  ? ? ?Current Medications: ?Current Outpatient Medications  ?Medication Sig Dispense Refill  ? acetaminophen (TYLENOL) 500 MG tablet Take 1,000 mg by mouth every 6 (six) hours as needed for mild pain.    ? ALPRAZolam (XANAX) 0.5 MG tablet Take 0.5 mg by mouth daily.     ? atorvastatin (LIPITOR) 10 MG tablet Take 10 mg by mouth daily.     ? benazepril (LOTENSIN) 40 MG tablet 40 mg daily.     ? cloNIDine (CATAPRES) 0.2 MG tablet Take 0.2 mg by mouth daily.     ? docusate sodium (COLACE) 100 MG capsule Take 100 mg by mouth 2 (two) times daily.    ? ferrous sulfate 325 (65 FE) MG tablet Take 325 mg by mouth 2 (two) times daily with a meal.    ? glucose blood (FREESTYLE LITE) test strip Use 2 (two) times daily    ? hydrALAZINE (APRESOLINE) 100 MG tablet 50 mg 3 (three) times daily.     ? ibandronate (BONIVA) 150 MG tablet     ? insulin glargine (LANTUS) 100 UNIT/ML injection Inject 20 Units into the skin at bedtime.    ? Insulin Pen Needle (NOVOFINE) 30G X 8 MM MISC as directed To inject insulin.    ? Insulin  Syringe-Needle U-100 (INSULIN SYRINGE .5CC/31GX5/16") 31G X 5/16" 0.5 ML MISC Use 1 syringe daily for insulin injections    ? isosorbide mononitrate (IMDUR) 30 MG 24 hr tablet Take by mouth.    ? levothyroxine (SYNTHROID, LEVOTHROID) 100 MCG tablet Take 100 mcg by mouth daily before breakfast.    ? magnesium oxide (MAG-OX) 400 MG tablet Take 400 mg by mouth daily.     ? metFORMIN (GLUCOPHAGE) 1000 MG tablet Take 1,000 mg by mouth 2 (two) times daily with a meal.    ? metoprolol tartrate (LOPRESSOR) 50 MG tablet Take 50 mg by mouth 2 (two) times daily.    ? mupirocin ointment (BACTROBAN) 2 % Apply 1 application topically 3 (three) times daily as needed.    ? omeprazole (PRILOSEC OTC) 20 MG tablet Take 20 mg by mouth daily.     ? OXYGEN Inhale 2 L into the lungs at bedtime.    ? pramoxine (PROCTOFOAM) 1 % foam Place 1 application rectally 3 (three) times daily as needed for hemorrhoids. 15 g 0  ? sucralfate (CARAFATE) 1 G tablet Take 1 g by mouth 4 (four) times daily.     ? albuterol (PROVENTIL HFA;VENTOLIN HFA) 108 (90 BASE) MCG/ACT inhaler Inhale 2 puffs into the lungs every 4 (four) hours as needed for wheezing or shortness of breath. (Patient not taking: Reported on 03/11/2022)     ? cyanocobalamin 1000 MCG tablet Inject 1,000 mcg into the muscle every 30 (thirty) days. (Patient not taking: Reported on 03/11/2022)    ? MELATONIN PO Take by mouth. (Patient not taking: Reported on 3/14

## 2022-11-11 ENCOUNTER — Encounter: Admission: RE | Payer: Self-pay | Source: Home / Self Care

## 2022-11-11 ENCOUNTER — Ambulatory Visit: Admission: RE | Admit: 2022-11-11 | Payer: Medicare Other | Source: Home / Self Care

## 2022-11-11 SURGERY — COLONOSCOPY WITH PROPOFOL
Anesthesia: General

## 2022-11-25 ENCOUNTER — Other Ambulatory Visit: Payer: Self-pay | Admitting: Internal Medicine

## 2022-11-25 DIAGNOSIS — Z1231 Encounter for screening mammogram for malignant neoplasm of breast: Secondary | ICD-10-CM

## 2023-03-11 ENCOUNTER — Other Ambulatory Visit: Payer: Self-pay | Admitting: *Deleted

## 2023-03-11 DIAGNOSIS — D0511 Intraductal carcinoma in situ of right breast: Secondary | ICD-10-CM

## 2023-03-12 ENCOUNTER — Inpatient Hospital Stay: Payer: Medicare Other

## 2023-03-12 ENCOUNTER — Inpatient Hospital Stay: Payer: Medicare Other | Admitting: Nurse Practitioner

## 2023-03-13 ENCOUNTER — Ambulatory Visit
Admission: RE | Admit: 2023-03-13 | Discharge: 2023-03-13 | Disposition: A | Discharge status: Home / Self Care | Payer: Medicare Other | Source: Ambulatory Visit | Attending: Internal Medicine | Admitting: Internal Medicine

## 2023-03-13 DIAGNOSIS — Z1231 Encounter for screening mammogram for malignant neoplasm of breast: Secondary | ICD-10-CM | POA: Insufficient documentation

## 2023-03-17 ENCOUNTER — Inpatient Hospital Stay: Payer: Medicare Other

## 2023-03-17 ENCOUNTER — Ambulatory Visit (INDEPENDENT_AMBULATORY_CARE_PROVIDER_SITE_OTHER): Payer: Medicare Other | Admitting: Dermatology

## 2023-03-17 ENCOUNTER — Inpatient Hospital Stay: Payer: Medicare Other | Admitting: Nurse Practitioner

## 2023-03-17 VITALS — BP 122/63 | HR 71

## 2023-03-17 DIAGNOSIS — D485 Neoplasm of uncertain behavior of skin: Secondary | ICD-10-CM

## 2023-03-17 DIAGNOSIS — C44311 Basal cell carcinoma of skin of nose: Secondary | ICD-10-CM | POA: Diagnosis not present

## 2023-03-17 DIAGNOSIS — L821 Other seborrheic keratosis: Secondary | ICD-10-CM | POA: Diagnosis not present

## 2023-03-17 DIAGNOSIS — C4491 Basal cell carcinoma of skin, unspecified: Secondary | ICD-10-CM

## 2023-03-17 DIAGNOSIS — D692 Other nonthrombocytopenic purpura: Secondary | ICD-10-CM | POA: Diagnosis not present

## 2023-03-17 DIAGNOSIS — L578 Other skin changes due to chronic exposure to nonionizing radiation: Secondary | ICD-10-CM

## 2023-03-17 HISTORY — DX: Basal cell carcinoma of skin, unspecified: C44.91

## 2023-03-17 NOTE — Patient Instructions (Addendum)
Wound Care Instructions  Cleanse wound gently with soap and water once a day then pat dry with clean gauze. Apply a thin coat of Petrolatum (petroleum jelly, "Vaseline") over the wound (unless you have an allergy to this). We recommend that you use a new, sterile tube of Vaseline. Do not pick or remove scabs. Do not remove the yellow or white "healing tissue" from the base of the wound.  Cover the wound with fresh, clean, nonstick gauze and secure with paper tape. You may use Band-Aids in place of gauze and tape if the wound is small enough, but would recommend trimming much of the tape off as there is often too much. Sometimes Band-Aids can irritate the skin.  You should call the office for your biopsy report after 1 week if you have not already been contacted.  If you experience any problems, such as abnormal amounts of bleeding, swelling, significant bruising, significant pain, or evidence of infection, please call the office immediately.  FOR ADULT SURGERY PATIENTS: If you need something for pain relief you may take 1 extra strength Tylenol (acetaminophen) AND 2 Ibuprofen (200mg each) together every 4 hours as needed for pain. (do not take these if you are allergic to them or if you have a reason you should not take them.) Typically, you may only need pain medication for 1 to 3 days.     Due to recent changes in healthcare laws, you may see results of your pathology and/or laboratory studies on MyChart before the doctors have had a chance to review them. We understand that in some cases there may be results that are confusing or concerning to you. Please understand that not all results are received at the same time and often the doctors may need to interpret multiple results in order to provide you with the best plan of care or course of treatment. Therefore, we ask that you please give us 2 business days to thoroughly review all your results before contacting the office for clarification. Should  we see a critical lab result, you will be contacted sooner.   If You Need Anything After Your Visit  If you have any questions or concerns for your doctor, please call our main line at [phone] and press option 4 to reach your doctor's medical assistant. If no one answers, please leave a voicemail as directed and we will return your call as soon as possible. Messages left after 4 pm will be answered the following business day.   You may also send us a message via MyChart. We typically respond to MyChart messages within 1-2 business days.  For prescription refills, please ask your pharmacy to contact our office. Our fax number is [phone].  If you have an urgent issue when the clinic is closed that cannot wait until the next business day, you can page your doctor at the number below.    Please note that while we do our best to be available for urgent issues outside of office hours, we are not available 24/7.   If you have an urgent issue and are unable to reach us, you may choose to seek medical care at your doctor's office, retail clinic, urgent care center, or emergency room.  If you have a medical emergency, please immediately call 911 or go to the emergency department.  Pager Numbers  - Dr. Kowalski: [phone]  - Dr. Moye: [phone]  - Dr. Stewart: [phone]  In the event of inclement weather, please call our main line at   [phone] for an update on the status of any delays or closures.  Dermatology Medication Tips: Please keep the boxes that topical medications come in in order to help keep track of the instructions about where and how to use these. Pharmacies typically print the medication instructions only on the boxes and not directly on the medication tubes.   If your medication is too expensive, please contact our office at [phone] option 4 or send us a message through MyChart.   We are unable to tell what your co-pay for medications will be in  advance as this is different depending on your insurance coverage. However, we may be able to find a substitute medication at lower cost or fill out paperwork to get insurance to cover a needed medication.   If a prior authorization is required to get your medication covered by your insurance company, please allow us 1-2 business days to complete this process.  Drug prices often vary depending on where the prescription is filled and some pharmacies may offer cheaper prices.  The website www.goodrx.com contains coupons for medications through different pharmacies. The prices here do not account for what the cost may be with help from insurance (it may be cheaper with your insurance), but the website can give you the price if you did not use any insurance.  - You can print the associated coupon and take it with your prescription to the pharmacy.  - You may also stop by our office during regular business hours and pick up a GoodRx coupon card.  - If you need your prescription sent electronically to a different pharmacy, notify our office through [facility] MyChart or by phone at [phone] option 4.     Si Usted Necesita Algo Despus de Su Visita  Tambin puede enviarnos un mensaje a travs de MyChart. Por lo general respondemos a los mensajes de MyChart en el transcurso de 1 a 2 das hbiles.  Para renovar recetas, por favor pida a su farmacia que se ponga en contacto con nuestra oficina. Nuestro nmero de fax es el [phone].  Si tiene un asunto urgente cuando la clnica est cerrada y que no puede esperar hasta el siguiente da hbil, puede llamar/localizar a su doctor(a) al nmero que aparece a continuacin.   Por favor, tenga en cuenta que aunque hacemos todo lo posible para estar disponibles para asuntos urgentes fuera del horario de oficina, no estamos disponibles las 24 horas del da, los 7 das de la semana.   Si tiene un problema urgente y no puede comunicarse con nosotros, puede  optar por buscar atencin mdica  en el consultorio de su doctor(a), en una clnica privada, en un centro de atencin urgente o en una sala de emergencias.  Si tiene una emergencia mdica, por favor llame inmediatamente al 911 o vaya a la sala de emergencias.  Nmeros de bper  - Dr. Kowalski: [phone]  - Dra. Moye: [phone]  - Dra. Stewart: [phone]  En caso de inclemencias del tiempo, por favor llame a nuestra lnea principal al [phone] para una actualizacin sobre el estado de cualquier retraso o cierre.  Consejos para la medicacin en dermatologa: Por favor, guarde las cajas en las que vienen los medicamentos de uso tpico para ayudarle a seguir las instrucciones sobre dnde y cmo usarlos. Las farmacias generalmente imprimen las instrucciones del medicamento slo en las cajas y no directamente en los tubos del medicamento.   Si su medicamento es muy caro, por favor, pngase en contacto con   nuestra oficina llamando al [phone] y presione la opcin 4 o envenos un mensaje a travs de MyChart.   No podemos decirle cul ser su copago por los medicamentos por adelantado ya que esto es diferente dependiendo de la cobertura de su seguro. Sin embargo, es posible que podamos encontrar un medicamento sustituto a menor costo o llenar un formulario para que el seguro cubra el medicamento que se considera necesario.   Si se requiere una autorizacin previa para que su compaa de seguros cubra su medicamento, por favor permtanos de 1 a 2 das hbiles para completar este proceso.  Los precios de los medicamentos varan con frecuencia dependiendo del lugar de dnde se surte la receta y alguna farmacias pueden ofrecer precios ms baratos.  El sitio web www.goodrx.com tiene cupones para medicamentos de diferentes farmacias. Los precios aqu no tienen en cuenta lo que podra costar con la ayuda del seguro (puede ser ms barato con su seguro), pero el sitio web puede darle el  precio si no utiliz ningn seguro.  - Puede imprimir el cupn correspondiente y llevarlo con su receta a la farmacia.  - Tambin puede pasar por nuestra oficina durante el horario de atencin regular y recoger una tarjeta de cupones de GoodRx.  - Si necesita que su receta se enve electrnicamente a una farmacia diferente, informe a nuestra oficina a travs de MyChart de [facility] o por telfono llamando al [phone] y presione la opcin 4.  

## 2023-03-17 NOTE — Progress Notes (Signed)
   New Patient Visit  Subjective  Hailey Hawkins is a 77 y.o. female who presents for the following: check spot (Nose, 46yrs, no symptoms) and check arms and hands (Pt does not have any particular spots on arms/hands but would like them checked). The patient has spots, moles and lesions to be evaluated, some may be new or changing and the patient has concerns that these could be cancer.   The following portions of the chart were reviewed this encounter and updated as appropriate:       Review of Systems:  No other skin or systemic complaints except as noted in HPI or Assessment and Plan.  Objective  Well appearing patient in no apparent distress; mood and affect are within normal limits.  A focused examination was performed including face, arms, hands. Relevant physical exam findings are noted in the Assessment and Plan.  Mid nasal dorsum 0.8 cm Pink pearly papule.       Assessment & Plan  Neoplasm of uncertain behavior of skin Mid nasal dorsum  Skin / nail biopsy Type of biopsy: tangential   Informed consent: discussed and consent obtained   Anesthesia: the lesion was anesthetized in a standard fashion   Anesthesia comment:  Area prepped with alcohol Anesthetic:  1% lidocaine w/ epinephrine 1-100,000 buffered w/ 8.4% NaHCO3 Instrument used: flexible razor blade   Hemostasis achieved with: pressure and aluminum chloride   Outcome: patient tolerated procedure well   Post-procedure details: wound care instructions given   Post-procedure details comment:  Ointment and small bandage applied  Specimen 1 - Surgical pathology Differential Diagnosis: D48.5 r/o BCC  Check Margins: No  If positive for [facility] recommend Mohs surgery. Discussed different locations and patient will think about it and let us know where she wants to be referred when we contact her with results.  Actinic Damage - chronic, secondary to cumulative UV radiation exposure/sun exposure over time - diffuse  scaly erythematous macules with underlying dyspigmentation - Recommend daily broad spectrum sunscreen SPF 30+ to sun-exposed areas, reapply every 2 hours as needed.  - Recommend staying in the shade or wearing long sleeves, sun glasses (UVA+UVB protection) and wide brim hats (4-inch brim around the entire circumference of the hat). - Call for new or changing lesions.  Seborrheic Keratoses - Stuck-on, waxy, tan-brown papules and/or plaques  - Benign-appearing - Discussed benign etiology and prognosis. - Observe - Call for any changes  Purpura - Chronic; persistent and recurrent.  Treatable, but not curable. - Violaceous macules and patches - Benign - Related to trauma, age, sun damage and/or use of blood thinners, chronic use of topical and/or oral steroids - Observe - Can use OTC arnica containing moisturizer such as Dermend Bruise Formula if desired - Call for worsening or other concerns  Return in 6 months (on 09/17/2023) for biopsy follow up, UBSE .  Luther Redo, CMA, am acting as scribe for Brendolyn Patty, MD .  Documentation: I have reviewed the above documentation for accuracy and completeness, and I agree with the above.  Brendolyn Patty MD

## 2023-03-23 ENCOUNTER — Telehealth: Payer: Self-pay

## 2023-03-23 NOTE — Telephone Encounter (Signed)
Tried to call patient with biopsy results. No answer and not able to leave voicemail.

## 2023-03-23 NOTE — Telephone Encounter (Signed)
-----   Message from Brendolyn Patty, MD sent at 03/23/2023 12:29 PM EDT ----- Skin , mid nasal dorsum BASAL CELL CARCINOMA, SUPERFICIAL AND NODULAR PATTERNS  BCC skin cancer, recommend Mohs surgery, refer to [place], or [facility] per pt preference.   - please call patient

## 2023-03-25 ENCOUNTER — Telehealth: Payer: Self-pay

## 2023-03-25 NOTE — Telephone Encounter (Signed)
Advised pt of bx results.  Discussed mohs at [place] or at [facility]  Pt will call back with where she would like to go.

## 2023-03-25 NOTE — Telephone Encounter (Signed)
-----   Message from Brendolyn Patty, MD sent at 03/23/2023 12:29 PM EDT ----- Skin , mid nasal dorsum BASAL CELL CARCINOMA, SUPERFICIAL AND NODULAR PATTERNS  BCC skin cancer, recommend Mohs surgery, refer to [place], or [facility] per pt preference.   - please call patient

## 2023-03-26 ENCOUNTER — Other Ambulatory Visit: Payer: Self-pay

## 2023-03-26 ENCOUNTER — Inpatient Hospital Stay: Payer: Medicare Other

## 2023-03-26 ENCOUNTER — Inpatient Hospital Stay (HOSPITAL_BASED_OUTPATIENT_CLINIC_OR_DEPARTMENT_OTHER): Payer: Medicare Other | Admitting: Nurse Practitioner

## 2023-03-26 ENCOUNTER — Encounter: Payer: Self-pay | Admitting: Nurse Practitioner

## 2023-03-26 ENCOUNTER — Inpatient Hospital Stay: Payer: Medicare Other | Attending: Nurse Practitioner

## 2023-03-26 VITALS — BP 93/55 | HR 64 | Temp 97.9°F | Wt 190.3 lb

## 2023-03-26 VITALS — BP 115/67 | HR 60

## 2023-03-26 DIAGNOSIS — I951 Orthostatic hypotension: Secondary | ICD-10-CM

## 2023-03-26 DIAGNOSIS — D509 Iron deficiency anemia, unspecified: Secondary | ICD-10-CM | POA: Diagnosis not present

## 2023-03-26 DIAGNOSIS — D649 Anemia, unspecified: Secondary | ICD-10-CM

## 2023-03-26 DIAGNOSIS — Z86000 Personal history of in-situ neoplasm of breast: Secondary | ICD-10-CM | POA: Diagnosis present

## 2023-03-26 DIAGNOSIS — I959 Hypotension, unspecified: Secondary | ICD-10-CM

## 2023-03-26 DIAGNOSIS — D0511 Intraductal carcinoma in situ of right breast: Secondary | ICD-10-CM

## 2023-03-26 LAB — CMP (CANCER CENTER ONLY)
ALT: 9 U/L (ref 0–44)
AST: 17 U/L (ref 15–41)
Albumin: 3.6 g/dL (ref 3.5–5.0)
Alkaline Phosphatase: 50 U/L (ref 38–126)
Anion gap: 7 (ref 5–15)
BUN: 16 mg/dL (ref 8–23)
CO2: 26 mmol/L (ref 22–32)
Calcium: 8.3 mg/dL — ABNORMAL LOW (ref 8.9–10.3)
Chloride: 104 mmol/L (ref 98–111)
Creatinine: 0.7 mg/dL (ref 0.44–1.00)
GFR, Estimated: >60 mL/min (ref >60)
Glucose, Bld: 302 mg/dL — ABNORMAL HIGH (ref 70–99)
Potassium: 4 mmol/L (ref 3.5–5.1)
Sodium: 137 mmol/L (ref 135–145)
Total Bilirubin: 0.8 mg/dL (ref 0.3–1.2)
Total Protein: 6.7 g/dL (ref 6.5–8.1)

## 2023-03-26 LAB — CBC WITH DIFFERENTIAL (CANCER CENTER ONLY)
Abs Immature Granulocytes: 0.02 10*3/uL (ref 0.00–0.07)
Basophils Absolute: 0.1 10*3/uL (ref 0.0–0.1)
Basophils Relative: 1 %
Eosinophils Absolute: 0.2 10*3/uL (ref 0.0–0.5)
Eosinophils Relative: 3 %
HCT: 37.2 % (ref 36.0–46.0)
Hemoglobin: 12.1 g/dL (ref 12.0–15.0)
Immature Granulocytes: 0 %
Lymphocytes Relative: 29 %
Lymphs Abs: 2.5 10*3/uL (ref 0.7–4.0)
MCH: 28.6 pg (ref 26.0–34.0)
MCHC: 32.5 g/dL (ref 30.0–36.0)
MCV: 87.9 fL (ref 80.0–100.0)
Monocytes Absolute: 0.6 10*3/uL (ref 0.1–1.0)
Monocytes Relative: 7 %
Neutro Abs: 5.2 10*3/uL (ref 1.7–7.7)
Neutrophils Relative %: 60 %
Platelet Count: 208 10*3/uL (ref 150–400)
RBC: 4.23 MIL/uL (ref 3.87–5.11)
RDW: 12.5 % (ref 11.5–15.5)
WBC Count: 8.6 10*3/uL (ref 4.0–10.5)
nRBC: 0 % (ref 0.0–0.2)

## 2023-03-26 LAB — IRON AND TIBC
Iron: 78 ug/dL (ref 28–170)
Saturation Ratios: 32 % — ABNORMAL HIGH (ref 10.4–31.8)
TIBC: 246 ug/dL — ABNORMAL LOW (ref 250–450)
UIBC: 168 ug/dL

## 2023-03-26 LAB — FERRITIN: Ferritin: 39 ng/mL (ref 11–307)

## 2023-03-26 MED ORDER — SODIUM CHLORIDE 0.9 % IV SOLN
INTRAVENOUS | Status: DC
  Filled 2023-03-26: qty 250

## 2023-03-26 NOTE — Progress Notes (Unsigned)
Patient tolerated fluids well, BP improved. Discharged, stable

## 2023-03-26 NOTE — Patient Instructions (Signed)

## 2023-03-26 NOTE — Progress Notes (Signed)
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   HPI: Hailey Hawkins presented as a 77 y.o. 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  Pathology revealed a 1.4 cm focus of grade 3 comedo type DCIS with microcalcifications and desmoplastic stromal response.  There was no evidence of invasive carcinoma. Margins were clear.  Tumor was ER was negative (< 1%) and PR was negative (0%).  She completed radiation to her right breast on 04/04/2014.    Mammogram on 12/17/2015 revealed a new group of 6 mm calcifications of the right breast which appeared around a central lucency suggestive of fat necrosis.  Right-sided mammogram on 06/17/2016 revealed calcifications within the medial right breasts benign consistent with evolving fat necrosis.  Bilateral mammogram on 01/05/2019 revealed no evidence of malignancy.  Bilateral screening mammogram on 03/05/2020 revealed no evidence of malignancy.   Chest CT on 11/03/2018 revealed an 11 mm groundglass opacity in the peripheral RUL mildly increased in size since 2012.  Indolent pulmonary adenocarcinoma could not be excluded.  Gastrohepatic ligament and retrocrural lymph nodes appear increased in size since CT imaging in 2012, but remained normal by size criteria.  Chest CT on 05/11/2019 revealed a 6 mm right upper lobe pulmonary nodule with no significant change compared to 11/03/2018, although once noted to show minimal increase in size compared to 2012 imaging.  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    Chest CT with contrast on 05/04/2020 revealed no acute findings or explanation for the patient's symptoms. There were stable small pulmonary nodules bilaterally, likely benign based on stability. There were no enlarging or  new pulmonary nodules. There was aortic atherosclerosis with stable mild dilatation of the ascending aorta.  She has a history of iron deficiency.  She is on oral iron.  EGD on 08/14/2016 revealed a small hiatal hernia and a discolored mucosa in the gastric body (iron pill gastritis).  Esophagus biopsy revealed reflux without dysplasia or malignancy.  Stomach polyp was hyperplastic and negative for H pylori, dysplasia or malignancy.  Colonoscopy on 08/14/2016 revealed 1 diminutive polyp in the descending colon, removed with forceps.  Pathology revealed a tubular adenoma which was negative for dysplasia or malignancy.   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    Interval History: [name] with above history of dcis, hormone negative, not on endocrine therapy, who returns to clinic for continued surveillance. She feels dizzy and weak today. Says her BP medication was recently changed and she feels poorly since starting new dose. Taking propranolol 80 mg now. She denies lumps or bumps, breast pain, skin changes. Denies black or bloody stools. Says she feels well. Is having issues with constipation and is waiting to have a repeat colonoscopy. Says she drinks plenty of fluids and fiber but ongoing symptoms.    Past Medical History:  Diagnosis Date   Anemia    pernicious   Asthma    Back pain    Basal cell carcinoma 03/17/2023   mid nasal dorsum, recommend Mohs   Breast cancer ([place]) 2015   right c lumpectomy c radiation   DCIS (ductal carcinoma in situ) of breast    Diabetes mellitus without complication (HCC)    Elevated cholesterol    GERD (gastroesophageal  reflux disease)    Hashimoto's disease    Hypertension    Hypothyroidism    MRSA infection    Obesity goither   Personal history of radiation therapy 2015   right breast DCIS   SIADH (syndrome of inappropriate ADH production) ([place])    SOB (shortness of breath)    Tachycardia     Past Surgical History:   Procedure Laterality Date   ABDOMINAL HYSTERECTOMY     BREAST BIOPSY Right 11/2013   DCIS   BREAST LUMPECTOMY Right 2015   DCIS, clear margins   CESAREAN SECTION     x2   colononoscopy     COLONOSCOPY N/A 08/14/2016   Procedure: COLONOSCOPY;  Surgeon: Manya Silvas, MD;  Location: [facility];  Service: Endoscopy;  Laterality: N/A;   COLONOSCOPY WITH ESOPHAGOGASTRODUODENOSCOPY (EGD)     ESOPHAGOGASTRODUODENOSCOPY (EGD) WITH PROPOFOL N/A 08/14/2016   Procedure: ESOPHAGOGASTRODUODENOSCOPY (EGD) WITH PROPOFOL;  Surgeon: Manya Silvas, MD;  Location[facility] ENDOSCOPY;  Service: Endoscopy;  Laterality: N/A;   INCISION AND DRAINAGE ABSCESS Left 10/18/2021   Procedure: INCISION AND DRAINAGE ABSCESS;  Surgeon: Herbert Pun, MD;  Location: [facility] ORS;  Service: General;  Laterality: Left;  Right labia   MASTECTOMY     MASTECTOMY, PARTIAL Right    REPLACEMENT TOTAL KNEE Right    REPLACEMENT TOTAL KNEE Left    THYROIDECTOMY, PARTIAL     caused throat paralysis  right side   TONSILLECTOMY      Family History  Problem Relation Age of Onset   Cancer Daughter        colon cancer; age 62   Colon cancer Daughter    Breast cancer Neg Hx     Social History:  reports that she has never smoked. She has never used smokeless tobacco. She reports that she does not drink alcohol and does not use drugs. The patient lives in [place].  Allergies:  Allergies  Allergen Reactions   Methamphetamine Shortness Of Breath    Difficulty breathing (INHALER)   Amoxicillin     Other reaction(s): Diarrhea and vomiting (finding)   Antihistamines, Chlorpheniramine-Type     Other reaction(s): Other (See Comments) Severe dryness   Ciprofloxacin     Other reaction(s): Distress (finding)   Codeine     Other reaction(s): Diarrhea and vomiting (finding), Weal or any derivitive   Other Other (See Comments)    Birth control pills - blood clots Birth control pills - blood clots    Oxycodone-Acetaminophen Nausea And Vomiting   Penicillin G     Other reaction(s): Weal   Meloxicam Palpitations    Current Medications: Current Outpatient Medications  Medication Sig Dispense Refill   acetaminophen (TYLENOL) 500 MG tablet Take 1,000 mg by mouth every 6 (six) hours as needed for mild pain.     ALPRAZolam (XANAX) 0.5 MG tablet Take 0.5 mg by mouth daily.     benazepril (LOTENSIN) 40 MG tablet 40 mg daily.      docusate sodium (COLACE) 100 MG capsule Take 100 mg by mouth 2 (two) times daily.     ferrous sulfate 325 (65 FE) MG tablet Take 325 mg by mouth 2 (two) times daily with a meal.     glucose blood (FREESTYLE LITE) test strip Use 2 (two) times daily     hydrALAZINE (APRESOLINE) 100 MG tablet 50 mg 3 (three) times daily.      ibandronate (BONIVA) 150 MG tablet      insulin glargine (LANTUS) 100 UNIT/ML injection Inject  20 Units into the skin at bedtime.     Insulin Pen Needle (NOVOFINE) 30G X 8 MM MISC as directed To inject insulin.     Insulin Syringe-Needle U-100 (INSULIN SYRINGE .5CC/31GX5/16") 31G X 5/16" 0.5 ML MISC Use 1 syringe daily for insulin injections     levothyroxine (SYNTHROID, LEVOTHROID) 100 MCG tablet Take 100 mcg by mouth daily before breakfast.     magnesium oxide (MAG-OX) 400 MG tablet Take 400 mg by mouth daily.      metFORMIN (GLUCOPHAGE) 1000 MG tablet Take 1,000 mg by mouth 2 (two) times daily with a meal.     metoprolol tartrate (LOPRESSOR) 50 MG tablet Take 50 mg by mouth 2 (two) times daily.     mupirocin ointment (BACTROBAN) 2 % Apply 1 application topically 3 (three) times daily as needed.     omeprazole (PRILOSEC OTC) 20 MG tablet Take 20 mg by mouth daily.      OXYGEN Inhale 2 L into the lungs at bedtime.     pramoxine (PROCTOFOAM) 1 % foam Place 1 application rectally 3 (three) times daily as needed for hemorrhoids. 15 g 0   propranolol (INDERAL) 80 MG tablet Take by mouth.     sucralfate (CARAFATE) 1 G tablet Take 1 g by mouth 4 (four)  times daily.      sulfamethoxazole-trimethoprim (BACTRIM DS) 800-160 MG tablet Take 1 tablet by mouth 2 (two) times daily. 7 tablet 0   albuterol (PROVENTIL HFA;VENTOLIN HFA) 108 (90 BASE) MCG/ACT inhaler Inhale 2 puffs into the lungs every 4 (four) hours as needed for wheezing or shortness of breath. (Patient not taking: Reported on 03/11/2022)     atorvastatin (LIPITOR) 10 MG tablet Take 10 mg by mouth daily.      cloNIDine (CATAPRES) 0.2 MG tablet Take 0.2 mg by mouth daily.      cyanocobalamin 1000 MCG tablet Inject 1,000 mcg into the muscle every 30 (thirty) days. (Patient not taking: Reported on 03/11/2022)     isosorbide mononitrate (IMDUR) 30 MG 24 hr tablet Take by mouth.     MELATONIN PO Take by mouth. (Patient not taking: Reported on 03/11/2022)     Multiple Vitamins-Minerals (ZINC PO) Take by mouth. (Patient not taking: Reported on 02/07/2021)     No current facility-administered medications for this visit.    Review of Systems  Constitutional:  Negative for chills, fever, malaise/fatigue and weight loss.  HENT:  Negative for hearing loss, nosebleeds, sore throat and tinnitus.   Eyes:  Negative for blurred vision and double vision.  Respiratory:  Negative for cough, hemoptysis, shortness of breath and wheezing.   Cardiovascular:  Negative for chest pain, palpitations and leg swelling.  Gastrointestinal:  Negative for abdominal pain, blood in stool, constipation, diarrhea, melena, nausea and vomiting.  Genitourinary:  Negative for dysuria and urgency.  Musculoskeletal:  Negative for back pain, falls, joint pain and myalgias.  Skin:  Negative for itching and rash.  Neurological:  Negative for dizziness, tingling, sensory change, loss of consciousness, weakness and headaches.  Endo/Heme/Allergies:  Negative for environmental allergies. Does not bruise/bleed easily.  Psychiatric/Behavioral:  Negative for depression. The patient is not nervous/anxious and does not have insomnia.     Performance status (ECOG): 1  Vitals Blood pressure (!) 93/55, pulse 64, temperature 97.9 F (36.6 C), temperature source Tympanic, weight 190 lb 4.8 oz (86.3 kg), SpO2 97 %.   Physical Exam Constitutional:      Appearance: She is not ill-appearing.  Cardiovascular:  Rate and Rhythm: Normal rate and regular rhythm.  Pulmonary:     Effort: Pulmonary effort is normal. No respiratory distress.  Abdominal:     General: There is no distension.     Tenderness: There is no abdominal tenderness.  Skin:    General: Skin is warm and dry.  Neurological:     Mental Status: She is alert and oriented to person, place, and time.  Psychiatric:        Mood and Affect: Mood normal.        Behavior: Behavior normal.       Latest Ref Rng & Units 03/26/2023    1:30 PM 03/11/2022   10:38 AM 02/07/2021    1:27 PM  CBC  WBC 4.0 - 10.5 K/uL 8.6  5.7  6.7   Hemoglobin 12.0 - 15.0 g/dL 16.1  09.6  04.5   Hematocrit 36.0 - 46.0 % 37.2  34.7  35.4   Platelets 150 - 400 K/uL 208  225  274       Latest Ref Rng & Units 03/26/2023    1:30 PM 03/11/2022   10:38 AM 02/21/2021    1:26 PM  CMP  Glucose 70 - 99 mg/dL 409  811    BUN 8 - 23 mg/dL 16  10    Creatinine 9.14 - 1.00 mg/dL 7.82  9.56    Sodium 213 - 145 mmol/L 137  132    Potassium 3.5 - 5.1 mmol/L 4.0  4.3    Chloride 98 - 111 mmol/L 104  100    CO2 22 - 32 mmol/L 26  25    Calcium 8.9 - 10.3 mg/dL 8.3  8.9    Total Protein 6.5 - 8.1 g/dL 6.7  7.6  8.2   Total Bilirubin 0.3 - 1.2 mg/dL 0.8  0.7  0.5   Alkaline Phos 38 - 126 U/L 50  57  59   AST 15 - 41 U/L 17  25  50   ALT 0 - 44 U/L 9  13  39    Iron/TIBC/Ferritin/ %Sat    Component Value Date/Time   IRON 73 03/11/2022 1038   TIBC 287 03/11/2022 1038   FERRITIN 28 03/11/2022 1038   IRONPCTSAT 25 03/11/2022 1038     Assessment & Plan: Left breast DCIS- s/p wide excision and radiation. She received no adjuvant endocrine therapy as DCIS is hormone receptor negative.  Mammogram  from 03/16/2022 was independently reviewed and was negative for any mammographic evidence of malignancy.  Reported as BI-RADS Category 1.  However, she does have heterogeneously dense breast tissue which may obscure small masses. Reviewed NCCN guidelines for surveillance including annual mammogram. She can have these done with pcp or through cancer center. Should she have concerning symptoms in the interim, recommend sooner evaluation.  Right upper lobe nodule- May 2021. Benign and stable for many years. No additional follow up was recommended.  Iron Deficiency Anemia- hmg 11 (2019). Today, hemoglobin 12.1. Normocytic. Ferritin 39. Iron sat 32%. Continue oral iron.  B12 deficiency- B12 low with pcp at 296. Start oral b12 which is available otc.  Hypotension & Orthostasis- likely related to recent medication changes. Will give patient IV fluids in clinic today. Symptoms improved. I reached out to PCP who recommends holding medication for today then continuing propranolol at 80 mg dose. She will hold clonidine and take if elevated pressures. PCP will see patient tomorrow for evaluation.  Basal Cell carcinoma- post biopsy. Awaiting Mohs surgery per  dermatology.   Disposition: Add fluids today 1 year - mammogram 3 mo- labs (cbc, cmp, ferritin, iron studies, b12), see MD to establish care- la  I discussed the assessment and treatment plan with the patient.  The patient was provided an opportunity to ask questions and all were answered.  The patient agreed with the plan and demonstrated an understanding of the instructions.  The patient was advised to call back if the symptoms worsen or if the condition fails to improve as anticipated.   I spent 30 minutes face-to-face visit time dedicated to the care of this patient on the date of this encounter to including pre-visit review of prior med-onc notes, labs, interval imaging, face-to-face time with the patient, speaking to outside provider, and post visit  ordering of testing/documentation.   Thank you for allowing me to participate in the care of your very pleasant patient.   Consuello Masse, DNP, AGNP-C [facility] [phone] (clinic)   03/26/2023  CC: Dr. Judithann Sheen

## 2023-03-29 DIAGNOSIS — I951 Orthostatic hypotension: Secondary | ICD-10-CM | POA: Diagnosis not present

## 2023-04-01 ENCOUNTER — Emergency Department: Payer: Medicare Other

## 2023-04-01 ENCOUNTER — Other Ambulatory Visit: Payer: Self-pay

## 2023-04-01 ENCOUNTER — Emergency Department
Admission: EM | Admit: 2023-04-01 | Discharge: 2023-04-01 | Disposition: A | Discharge status: Home / Self Care | Payer: Medicare Other | Attending: Emergency Medicine | Admitting: Emergency Medicine

## 2023-04-01 ENCOUNTER — Encounter: Payer: Self-pay | Admitting: *Deleted

## 2023-04-01 DIAGNOSIS — I2510 Atherosclerotic heart disease of native coronary artery without angina pectoris: Secondary | ICD-10-CM | POA: Insufficient documentation

## 2023-04-01 DIAGNOSIS — R002 Palpitations: Secondary | ICD-10-CM | POA: Insufficient documentation

## 2023-04-01 DIAGNOSIS — I10 Essential (primary) hypertension: Secondary | ICD-10-CM | POA: Diagnosis not present

## 2023-04-01 DIAGNOSIS — R0789 Other chest pain: Secondary | ICD-10-CM | POA: Diagnosis not present

## 2023-04-01 DIAGNOSIS — E119 Type 2 diabetes mellitus without complications: Secondary | ICD-10-CM | POA: Insufficient documentation

## 2023-04-01 DIAGNOSIS — J45909 Unspecified asthma, uncomplicated: Secondary | ICD-10-CM | POA: Insufficient documentation

## 2023-04-01 LAB — CBC
HCT: 40.1 % (ref 36.0–46.0)
Hemoglobin: 13.1 g/dL (ref 12.0–15.0)
MCH: 28.4 pg (ref 26.0–34.0)
MCHC: 32.7 g/dL (ref 30.0–36.0)
MCV: 86.8 fL (ref 80.0–100.0)
Platelets: 206 10*3/uL (ref 150–400)
RBC: 4.62 MIL/uL (ref 3.87–5.11)
RDW: 12.4 % (ref 11.5–15.5)
WBC: 8 10*3/uL (ref 4.0–10.5)
nRBC: 0 % (ref 0.0–0.2)

## 2023-04-01 LAB — BASIC METABOLIC PANEL
Anion gap: 11 (ref 5–15)
BUN: 10 mg/dL (ref 8–23)
CO2: 21 mmol/L — ABNORMAL LOW (ref 22–32)
Calcium: 9.1 mg/dL (ref 8.9–10.3)
Chloride: 102 mmol/L (ref 98–111)
Creatinine, Ser: 0.58 mg/dL (ref 0.44–1.00)
GFR, Estimated: >60 mL/min (ref >60)
Glucose, Bld: 352 mg/dL — ABNORMAL HIGH (ref 70–99)
Potassium: 4.1 mmol/L (ref 3.5–5.1)
Sodium: 134 mmol/L — ABNORMAL LOW (ref 135–145)

## 2023-04-01 LAB — TROPONIN I (HIGH SENSITIVITY)
Troponin I (High Sensitivity): 11 ng/L (ref <18)
Troponin I (High Sensitivity): 10 ng/L (ref <18)

## 2023-04-01 LAB — MAGNESIUM: Magnesium: 1.4 mg/dL — ABNORMAL LOW (ref 1.7–2.4)

## 2023-04-01 MED ORDER — MAGNESIUM OXIDE -MG SUPPLEMENT 400 (240 MG) MG PO TABS
800.0000 mg | ORAL_TABLET | Freq: Once | ORAL | Status: AC
  Administered 2023-04-01: 800 mg via ORAL
  Filled 2023-04-01: qty 2

## 2023-04-01 NOTE — ED Provider Notes (Signed)
[facility] Provider Note    Event Date/Time   First MD Initiated Contact with Patient 04/01/23 1954     (approximate)   History   Chief Complaint Chest Pain   HPI  Hailey Hawkins is a 77 y.o. female with past medical history of hypertension, diabetes, CAD, anemia, asthma, and SIADH who presents to the ED complaining of chest pain.  Patient reports that she has been dealing with "fluttering" in her chest intermittently for multiple weeks, however became more persistent earlier today.  Since last night she also reports intermittent discomfort and pressure in the left side of her chest.  This does not seem to be associated with the fluttering and she denies any fevers, cough, or difficulty breathing.  She has not had any pain or swelling in her legs.  She denies any history of arrhythmia.     Physical Exam   Triage Vital Signs: ED Triage Vitals  Enc Vitals Group     BP 04/01/23 1613 (!) 156/102     Pulse Rate 04/01/23 1613 (!) 106     Resp 04/01/23 1613 20     Temp 04/01/23 1613 98.2 F (36.8 C)     Temp Source 04/01/23 1613 Oral     SpO2 04/01/23 1613 95 %     Weight 04/01/23 1610 194 lb (88 kg)     Height 04/01/23 1610 5\' 5"  (1.651 m)     Head Circumference --      Peak Flow --      Pain Score 04/01/23 1610 5     Pain Loc --      Pain Edu? --      Excl. in [place]? --     Most recent vital signs: Vitals:   04/01/23 1613 04/01/23 2044  BP: (!) 156/102 (!) 159/96  Pulse: (!) 106 81  Resp: 20 18  Temp: 98.2 F (36.8 C) 98.2 F (36.8 C)  SpO2: 95% 95%    Constitutional: Alert and oriented. Eyes: Conjunctivae are normal. Head: Atraumatic. Nose: No congestion/rhinnorhea. Mouth/Throat: Mucous membranes are moist. Cardiovascular: Normal rate, regular rhythm. Grossly normal heart sounds.  2+ radial pulses bilaterally. Respiratory: Normal respiratory effort.  No retractions. Lungs CTAB. Gastrointestinal: Soft and nontender. No  distention. Musculoskeletal: No lower extremity tenderness nor edema.  Neurologic:  Normal speech and language. No gross focal neurologic deficits are appreciated.    ED Results / Procedures / Treatments   Labs (all labs ordered are listed, but only abnormal results are displayed) Labs Reviewed  BASIC METABOLIC PANEL - Abnormal; Notable for the following components:      Result Value   Sodium 134 (*)    CO2 21 (*)    Glucose, Bld 352 (*)    All other components within normal limits  MAGNESIUM - Abnormal; Notable for the following components:   Magnesium 1.4 (*)    All other components within normal limits  CBC  TROPONIN I (HIGH SENSITIVITY)  TROPONIN I (HIGH SENSITIVITY)     EKG  ED ECG REPORT I, Blake Divine, the attending physician, personally viewed and interpreted this ECG.   Date: 04/01/2023  EKG Time: 16:07  Rate: 110  Rhythm: sinus tachycardia  Axis: LAD  Intervals:left anterior fascicular block  ST&T Change: None  RADIOLOGY Chest x-ray reviewed and interpreted by me with no infiltrate, edema, or effusion.  PROCEDURES:  Critical Care performed: No  Procedures   MEDICATIONS ORDERED IN ED: Medications  magnesium oxide (MAG-OX) tablet 800  mg (800 mg Oral Given 04/01/23 2131)     IMPRESSION / MDM / ASSESSMENT AND PLAN / ED COURSE  I reviewed the triage vital signs and the nursing notes.                              77 y.o. female with past medical history of hypertension, diabetes, anemia, asthma, CAD, and SIADH who presents to the ED complaining of intermittent fluttering in her chest for multiple weeks, now with some intermittent chest pressure since yesterday.  Patient's presentation is most consistent with acute presentation with potential threat to life or bodily function.  Differential diagnosis includes, but is not limited to, arrhythmia, ACS, PE, dissection, pneumonia, pneumothorax, electrolyte abnormality, anemia, AKI.  Patient  nontoxic-appearing and in no acute distress, vital signs are unremarkable.  EKG shows sinus tachycardia with occasional PAC, no evidence of arrhythmia but will observe on cardiac monitor.  Labs are reassuring with no significant anemia, leukocytosis, or AKI, 2 sets of troponin are within normal limits.  Low suspicion for ACS given atypical symptoms and doubt PE or dissection as patient having no chest pain currently.  She does have low magnesium level and we will replete.  No events noted on cardiac monitor, patient requesting to be discharged home and continues to deny any active chest pain.  She was counseled to return to the ED for new or worsening symptoms, follow-up with her PCP for recheck of magnesium level and reestablish with cardiology.  She states she already has prescription available for oral magnesium at home.      FINAL CLINICAL IMPRESSION(S) / ED DIAGNOSES   Final diagnoses:  Palpitations  Atypical chest pain  Hypomagnesemia     Rx / DC Orders   ED Discharge Orders          Ordered    Ambulatory referral to Cardiology        04/01/23 2135             Note:  This document was prepared using Dragon voice recognition software and may include unintentional dictation errors.   Blake Divine, MD 04/01/23 2137

## 2023-04-01 NOTE — ED Triage Notes (Signed)
Pt sent to ER for eval from [facility].  Pt reports chest pain for 2-3 days.  Pt also has sob and feels weak all over.  No cough.  No n/v/d   pt alert   speech clear.

## 2023-04-06 ENCOUNTER — Telehealth: Payer: Self-pay

## 2023-04-06 NOTE — Telephone Encounter (Signed)
Tried calling pt to see where she would like referral for mohs to go and pt did not answer and no voicemail./sh

## 2023-04-09 ENCOUNTER — Telehealth: Payer: Self-pay

## 2023-04-09 DIAGNOSIS — C44311 Basal cell carcinoma of skin of nose: Secondary | ICD-10-CM

## 2023-04-09 NOTE — Telephone Encounter (Signed)
-----   Message from Tara Stewart, MD sent at 03/23/2023 12:29 PM EDT ----- Skin , mid nasal dorsum BASAL CELL CARCINOMA, SUPERFICIAL AND NODULAR PATTERNS  BCC skin cancer, recommend Mohs surgery, refer to [facility], [facility], or [facility] per pt preference.   - please call patient 

## 2023-04-09 NOTE — Telephone Encounter (Signed)
Called pt to see if she decided where she would like to have mohs treatment for the [facility] of the mid nasal dorsum.  Pt advised she would like to go to [facility].  Referral sent to Dr. Lorn Junes at [facility]./sh

## 2023-06-24 DIAGNOSIS — I480 Paroxysmal atrial fibrillation: Secondary | ICD-10-CM | POA: Diagnosis present

## 2023-06-25 ENCOUNTER — Other Ambulatory Visit: Payer: Self-pay

## 2023-06-25 DIAGNOSIS — D649 Anemia, unspecified: Secondary | ICD-10-CM

## 2023-06-26 ENCOUNTER — Inpatient Hospital Stay: Payer: Medicare PPO

## 2023-06-26 ENCOUNTER — Inpatient Hospital Stay: Payer: Medicare PPO | Attending: Nurse Practitioner | Admitting: Internal Medicine

## 2023-07-07 ENCOUNTER — Inpatient Hospital Stay: Payer: Medicare PPO

## 2023-07-07 ENCOUNTER — Inpatient Hospital Stay: Payer: Medicare PPO | Admitting: Internal Medicine

## 2023-07-07 ENCOUNTER — Inpatient Hospital Stay: Payer: Medicare PPO | Attending: Nurse Practitioner

## 2023-07-07 ENCOUNTER — Inpatient Hospital Stay (HOSPITAL_BASED_OUTPATIENT_CLINIC_OR_DEPARTMENT_OTHER): Payer: Medicare PPO | Admitting: Internal Medicine

## 2023-07-07 VITALS — BP 128/75 | HR 81 | Temp 98.5°F | Wt 185.0 lb

## 2023-07-07 DIAGNOSIS — Z803 Family history of malignant neoplasm of breast: Secondary | ICD-10-CM | POA: Diagnosis not present

## 2023-07-07 DIAGNOSIS — D649 Anemia, unspecified: Secondary | ICD-10-CM | POA: Diagnosis not present

## 2023-07-07 DIAGNOSIS — Z9012 Acquired absence of left breast and nipple: Secondary | ICD-10-CM | POA: Diagnosis not present

## 2023-07-07 DIAGNOSIS — R911 Solitary pulmonary nodule: Secondary | ICD-10-CM | POA: Insufficient documentation

## 2023-07-07 DIAGNOSIS — Z86000 Personal history of in-situ neoplasm of breast: Secondary | ICD-10-CM | POA: Insufficient documentation

## 2023-07-07 DIAGNOSIS — D0511 Intraductal carcinoma in situ of right breast: Secondary | ICD-10-CM

## 2023-07-07 DIAGNOSIS — E538 Deficiency of other specified B group vitamins: Secondary | ICD-10-CM | POA: Insufficient documentation

## 2023-07-07 DIAGNOSIS — D509 Iron deficiency anemia, unspecified: Secondary | ICD-10-CM | POA: Diagnosis not present

## 2023-07-07 DIAGNOSIS — Z85828 Personal history of other malignant neoplasm of skin: Secondary | ICD-10-CM | POA: Diagnosis not present

## 2023-07-07 DIAGNOSIS — Z800 Family history of malignant neoplasm of digestive organs: Secondary | ICD-10-CM | POA: Diagnosis not present

## 2023-07-07 LAB — CBC WITH DIFFERENTIAL (CANCER CENTER ONLY)
Abs Immature Granulocytes: 0.02 10*3/uL (ref 0.00–0.07)
Basophils Absolute: 0.1 10*3/uL (ref 0.0–0.1)
Basophils Relative: 1 %
Eosinophils Absolute: 0.2 10*3/uL (ref 0.0–0.5)
Eosinophils Relative: 3 %
HCT: 38.1 % (ref 36.0–46.0)
Hemoglobin: 12.3 g/dL (ref 12.0–15.0)
Immature Granulocytes: 0 %
Lymphocytes Relative: 32 %
Lymphs Abs: 1.8 10*3/uL (ref 0.7–4.0)
MCH: 28.6 pg (ref 26.0–34.0)
MCHC: 32.3 g/dL (ref 30.0–36.0)
MCV: 88.6 fL (ref 80.0–100.0)
Monocytes Absolute: 0.4 10*3/uL (ref 0.1–1.0)
Monocytes Relative: 6 %
Neutro Abs: 3.2 10*3/uL (ref 1.7–7.7)
Neutrophils Relative %: 58 %
Platelet Count: 209 10*3/uL (ref 150–400)
RBC: 4.3 MIL/uL (ref 3.87–5.11)
RDW: 12.1 % (ref 11.5–15.5)
WBC Count: 5.7 10*3/uL (ref 4.0–10.5)
nRBC: 0 % (ref 0.0–0.2)

## 2023-07-07 LAB — CMP (CANCER CENTER ONLY)
ALT: 25 U/L (ref 0–44)
AST: 38 U/L (ref 15–41)
Albumin: 3.7 g/dL (ref 3.5–5.0)
Alkaline Phosphatase: 79 U/L (ref 38–126)
Anion gap: 8 (ref 5–15)
BUN: 13 mg/dL (ref 8–23)
CO2: 25 mmol/L (ref 22–32)
Calcium: 9 mg/dL (ref 8.9–10.3)
Chloride: 104 mmol/L (ref 98–111)
Creatinine: 0.64 mg/dL (ref 0.44–1.00)
GFR, Estimated: >60 mL/min (ref >60)
Glucose, Bld: 243 mg/dL — ABNORMAL HIGH (ref 70–99)
Potassium: 3.9 mmol/L (ref 3.5–5.1)
Sodium: 137 mmol/L (ref 135–145)
Total Bilirubin: 0.7 mg/dL (ref 0.3–1.2)
Total Protein: 7.6 g/dL (ref 6.5–8.1)

## 2023-07-07 LAB — IRON AND TIBC
Iron: 88 ug/dL (ref 28–170)
Saturation Ratios: 29 % (ref 10.4–31.8)
TIBC: 302 ug/dL (ref 250–450)
UIBC: 214 ug/dL

## 2023-07-07 LAB — FERRITIN: Ferritin: 26 ng/mL (ref 11–307)

## 2023-07-07 NOTE — Progress Notes (Signed)
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   HPI: Hailey Hawkins presented as a 77 y.o. 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  Pathology revealed a 1.4 cm focus of grade 3 comedo type DCIS with microcalcifications and desmoplastic stromal response.  There was no evidence of invasive carcinoma. Margins were clear.  Tumor was ER was negative (< 1%) and PR was negative (0%).  She completed radiation to her right breast on 04/04/2014.    Mammogram on 12/17/2015 revealed a new group of 6 mm calcifications of the right breast which appeared around a central lucency suggestive of fat necrosis.  Right-sided mammogram on 06/17/2016 revealed calcifications within the medial right breasts benign consistent with evolving fat necrosis.  Bilateral mammogram on 01/05/2019 revealed no evidence of malignancy.  Bilateral screening mammogram on 03/05/2020 revealed no evidence of malignancy.   Chest CT on 11/03/2018 revealed an 11 mm groundglass opacity in the peripheral RUL mildly increased in size since 2012.  Indolent pulmonary adenocarcinoma could not be excluded.  Gastrohepatic ligament and retrocrural lymph nodes appear increased in size since CT imaging in 2012, but remained normal by size criteria.  Chest CT on 05/11/2019 revealed a 6 mm right upper lobe pulmonary nodule with no significant change compared to 11/03/2018, although once noted to show minimal increase in size compared to 2012 imaging.  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    Chest CT with contrast on 05/04/2020 revealed no acute findings or explanation for the patient's symptoms. There were stable small pulmonary nodules bilaterally, likely benign based on stability. There were no enlarging or  new pulmonary nodules. There was aortic atherosclerosis with stable mild dilatation of the ascending aorta.  She has a history of iron deficiency.  She is on oral iron.  EGD on 08/14/2016 revealed a small hiatal hernia and a discolored mucosa in the gastric body (iron pill gastritis).  Esophagus biopsy revealed reflux without dysplasia or malignancy.  Stomach polyp was hyperplastic and negative for H pylori, dysplasia or malignancy.  Colonoscopy on 08/14/2016 revealed 1 diminutive polyp in the descending colon, removed with forceps.  Pathology revealed a tubular adenoma which was negative for dysplasia or malignancy.   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    Interval History:  Patient was seen today as follow-up for iron deficiency anemia, mammogram and B12 deficiency. She reports chronic fatigue.  Denies any shortness of breath, chest pain, dizziness. She is taking iron pills once a day which causes upset stomach and mild constipation.  Also taking B12 supplements daily.  Past Medical History:  Diagnosis Date   Anemia    pernicious   Asthma    Back pain    Basal cell carcinoma 03/17/2023   mid nasal dorsum, Mohs 05/06/2023   Breast cancer (HCC) 2015   right c lumpectomy c radiation   DCIS (ductal carcinoma in situ) of breast    Diabetes mellitus without complication (HCC)    Elevated cholesterol    GERD (gastroesophageal reflux disease)    Hashimoto's disease    Hypertension    Hypothyroidism    MRSA infection    Obesity goither   Personal history of radiation therapy 2015   right breast DCIS   SIADH (syndrome of inappropriate ADH  production) (HCC)    SOB (shortness of breath)    Tachycardia     Past Surgical History:  Procedure Laterality Date   ABDOMINAL HYSTERECTOMY     BREAST BIOPSY Right 11/2013   DCIS   BREAST LUMPECTOMY Right 2015   DCIS, clear margins   CESAREAN SECTION     x2   colononoscopy     COLONOSCOPY N/A 08/14/2016   Procedure: COLONOSCOPY;   Surgeon: Scot Jun, MD;  Location: [facility] ENDOSCOPY;  Service: Endoscopy;  Laterality: N/A;   COLONOSCOPY WITH ESOPHAGOGASTRODUODENOSCOPY (EGD)     ESOPHAGOGASTRODUODENOSCOPY (EGD) WITH PROPOFOL N/A 08/14/2016   Procedure: ESOPHAGOGASTRODUODENOSCOPY (EGD) WITH PROPOFOL;  Surgeon: Scot Jun, MD;  Location: [facility] ENDOSCOPY;  Service: Endoscopy;  Laterality: N/A;   INCISION AND DRAINAGE ABSCESS Left 10/18/2021   Procedure: INCISION AND DRAINAGE ABSCESS;  Surgeon: Carolan Shiver, MD;  Location: [facility] ORS;  Service: General;  Laterality: Left;  Right labia   MASTECTOMY     MASTECTOMY, PARTIAL Right    REPLACEMENT TOTAL KNEE Right    REPLACEMENT TOTAL KNEE Left    THYROIDECTOMY, PARTIAL     caused throat paralysis  right side   TONSILLECTOMY      Family History  Problem Relation Age of Onset   Cancer Daughter        colon cancer; age 39   Colon cancer Daughter    Breast cancer Neg Hx     Social History:  reports that she has never smoked. She has never used smokeless tobacco. She reports that she does not drink alcohol and does not use drugs. The patient lives in [place].  Allergies:  Allergies  Allergen Reactions   Methamphetamine Shortness Of Breath    Difficulty breathing (INHALER)   Amoxicillin     Other reaction(s): Diarrhea and vomiting (finding)   Antihistamines, Chlorpheniramine-Type     Other reaction(s): Other (See Comments) Severe dryness   Ciprofloxacin     Other reaction(s): Distress (finding)   Codeine     Other reaction(s): Diarrhea and vomiting (finding), Weal or any derivitive   Other Other (See Comments)    Birth control pills - blood clots Birth control pills - blood clots   Oxycodone-Acetaminophen Nausea And Vomiting   Penicillin G     Other reaction(s): Weal   Meloxicam Palpitations    Current Medications: Current Outpatient Medications  Medication Sig Dispense Refill   acetaminophen (TYLENOL) 500 MG tablet Take 1,000 mg by mouth  every 6 (six) hours as needed for mild pain.     albuterol (PROVENTIL HFA;VENTOLIN HFA) 108 (90 BASE) MCG/ACT inhaler Inhale 2 puffs into the lungs every 4 (four) hours as needed for wheezing or shortness of breath. (Patient not taking: Reported on 03/11/2022)     ALPRAZolam (XANAX) 0.5 MG tablet Take 0.5 mg by mouth daily.     atorvastatin (LIPITOR) 10 MG tablet Take 10 mg by mouth daily.      benazepril (LOTENSIN) 40 MG tablet 40 mg daily.      cloNIDine (CATAPRES) 0.2 MG tablet Take 0.2 mg by mouth daily.      cyanocobalamin 1000 MCG tablet Inject 1,000 mcg into the muscle every 30 (thirty) days. (Patient not taking: Reported on 03/11/2022)     docusate sodium (COLACE) 100 MG capsule Take 100 mg by mouth 2 (two) times daily.     ferrous sulfate 325 (65 FE) MG tablet Take 325 mg by mouth 2 (two) times daily with a meal.  glucose blood (FREESTYLE LITE) test strip Use 2 (two) times daily     hydrALAZINE (APRESOLINE) 100 MG tablet 50 mg 3 (three) times daily.      ibandronate (BONIVA) 150 MG tablet      insulin glargine (LANTUS) 100 UNIT/ML injection Inject 20 Units into the skin at bedtime.     Insulin Pen Needle (NOVOFINE) 30G X 8 MM MISC as directed To inject insulin.     Insulin Syringe-Needle U-100 (INSULIN SYRINGE .5CC/31GX5/16") 31G X 5/16" 0.5 ML MISC Use 1 syringe daily for insulin injections     isosorbide mononitrate (IMDUR) 30 MG 24 hr tablet Take by mouth.     levothyroxine (SYNTHROID, LEVOTHROID) 100 MCG tablet Take 100 mcg by mouth daily before breakfast.     magnesium oxide (MAG-OX) 400 MG tablet Take 400 mg by mouth daily.      MELATONIN PO Take by mouth. (Patient not taking: Reported on 03/11/2022)     metFORMIN (GLUCOPHAGE) 1000 MG tablet Take 1,000 mg by mouth 2 (two) times daily with a meal.     metoprolol tartrate (LOPRESSOR) 50 MG tablet Take 50 mg by mouth 2 (two) times daily.     Multiple Vitamins-Minerals (ZINC PO) Take by mouth. (Patient not taking: Reported on  02/07/2021)     mupirocin ointment (BACTROBAN) 2 % Apply 1 application topically 3 (three) times daily as needed.     omeprazole (PRILOSEC OTC) 20 MG tablet Take 20 mg by mouth daily.      OXYGEN Inhale 2 L into the lungs at bedtime.     pramoxine (PROCTOFOAM) 1 % foam Place 1 application rectally 3 (three) times daily as needed for hemorrhoids. 15 g 0   propranolol (INDERAL) 80 MG tablet Take by mouth.     sucralfate (CARAFATE) 1 G tablet Take 1 g by mouth 4 (four) times daily.      sulfamethoxazole-trimethoprim (BACTRIM DS) 800-160 MG tablet Take 1 tablet by mouth 2 (two) times daily. 7 tablet 0   No current facility-administered medications for this visit.   Facility-Administered Medications Ordered in Other Visits  Medication Dose Route Frequency Provider Last Rate Last Admin   0.9 %  sodium chloride infusion   Intravenous Continuous Alinda Dooms, NP   Stopped at 03/26/23 1542    Review of Systems  Constitutional:  Negative for chills, fever, malaise/fatigue and weight loss.  HENT:  Negative for hearing loss, nosebleeds, sore throat and tinnitus.   Eyes:  Negative for blurred vision and double vision.  Respiratory:  Negative for cough, hemoptysis, shortness of breath and wheezing.   Cardiovascular:  Negative for chest pain, palpitations and leg swelling.  Gastrointestinal:  Negative for abdominal pain, blood in stool, constipation, diarrhea, melena, nausea and vomiting.  Genitourinary:  Negative for dysuria and urgency.  Musculoskeletal:  Negative for back pain, falls, joint pain and myalgias.  Skin:  Negative for itching and rash.  Neurological:  Negative for dizziness, tingling, sensory change, loss of consciousness, weakness and headaches.  Endo/Heme/Allergies:  Negative for environmental allergies. Does not bruise/bleed easily.  Psychiatric/Behavioral:  Negative for depression. The patient is not nervous/anxious and does not have insomnia.    Performance status (ECOG):  1  Vitals There were no vitals taken for this visit.   Physical Exam Constitutional:      Appearance: She is not ill-appearing.  Cardiovascular:     Rate and Rhythm: Normal rate and regular rhythm.  Pulmonary:     Effort: Pulmonary effort is normal.  No respiratory distress.  Abdominal:     General: There is no distension.     Tenderness: There is no abdominal tenderness.  Skin:    General: Skin is warm and dry.  Neurological:     Mental Status: She is alert and oriented to person, place, and time.  Psychiatric:        Mood and Affect: Mood normal.        Behavior: Behavior normal.      Latest Ref Rng & Units 07/07/2023    2:03 PM 04/01/2023    3:45 PM 03/26/2023    1:30 PM  CBC  WBC 4.0 - 10.5 K/uL 5.7  8.0  8.6   Hemoglobin 12.0 - 15.0 g/dL 16.1  09.6  04.5   Hematocrit 36.0 - 46.0 % 38.1  40.1  37.2   Platelets 150 - 400 K/uL 209  206  208       Latest Ref Rng & Units 04/01/2023    3:45 PM 03/26/2023    1:30 PM 03/11/2022   10:38 AM  CMP  Glucose 70 - 99 mg/dL 409  811  914   BUN 8 - 23 mg/dL 10  16  10    Creatinine 0.44 - 1.00 mg/dL 7.82  9.56  2.13   Sodium 135 - 145 mmol/L 134  137  132   Potassium 3.5 - 5.1 mmol/L 4.1  4.0  4.3   Chloride 98 - 111 mmol/L 102  104  100   CO2 22 - 32 mmol/L 21  26  25    Calcium 8.9 - 10.3 mg/dL 9.1  8.3  8.9   Total Protein 6.5 - 8.1 g/dL  6.7  7.6   Total Bilirubin 0.3 - 1.2 mg/dL  0.8  0.7   Alkaline Phos 38 - 126 U/L  50  57   AST 15 - 41 U/L  17  25   ALT 0 - 44 U/L  9  13    Iron/TIBC/Ferritin/ %Sat    Component Value Date/Time   IRON 78 03/26/2023 1330   TIBC 246 (L) 03/26/2023 1330   FERRITIN 39 03/26/2023 1330   IRONPCTSAT 32 (H) 03/26/2023 1330     Assessment & Plan: Left breast DCIS- s/p wide excision and radiation. She received no adjuvant endocrine therapy as DCIS is hormone receptor negative.  Mammogram from March 2023 was normal.  Will schedule for repeat mammogram in March 2024.  Right upper lobe nodule-  May 2021. Benign and stable for many years. No additional follow up was recommended.   Iron Deficiency Anemia-hemoglobin today is 12.3.  Iron panel is pending.  From March it has been normal.  Patient is taking iron supplements daily which causes mild constipation.  She was advised can go down to 3 times a week Monday Wednesday Friday.  B12 deficiency-B12 level pending from today.  Continue with OTC B12 1000 mcg daily  Basal Cell carcinoma-underwent Mohs procedure with dermatology in [facility] on bridge of the nose.  Continue to follow-up with dermatology  Orders Placed This Encounter  Procedures   MM 3D SCREENING MAMMOGRAM BILATERAL BREAST   CBC with Differential (Cancer Center Only)   CMP (Cancer Center only)   Iron and TIBC([facility])   Ferritin   Vitamin B12    Disposition: Scheduled for mammogram in March 2025 RTC in 8 months for MD visit, labs, discuss mammo.  I discussed the assessment and treatment plan with the patient.  The patient was provided an opportunity to  ask questions and all were answered.  The patient agreed with the plan and demonstrated an understanding of the instructions.  The patient was advised to call back if the symptoms worsen or if the condition fails to improve as anticipated.   I spent 30 minutes face-to-face visit time dedicated to the care of this patient on the date of this encounter to including pre-visit review of prior med-onc notes, labs, interval imaging, face-to-face time with the patient, speaking to outside provider, and post visit ordering of testing/documentation.   Thank you for allowing me to participate in the care of your very pleasant patient.   Consuello Masse, DNP, AGNP-C [facility] [phone] (clinic)   07/07/2023  CC: Dr. Judithann Sheen

## 2023-07-07 NOTE — Progress Notes (Signed)
Patient is fatigued, which she says that the issue is ongoing for quit some time now.

## 2023-07-07 NOTE — Patient Instructions (Signed)
You can consider to change your iron supplements to three times a week if it is bothersome to your stomach (Mon-Wed-Fri).

## 2023-07-08 ENCOUNTER — Telehealth: Payer: Self-pay

## 2023-07-08 LAB — VITAMIN B12: Vitamin B-12: 2999 pg/mL — ABNORMAL HIGH (ref 180–914)

## 2023-07-08 NOTE — Telephone Encounter (Signed)
Called to let the patient know that her B12 levels were high. So Dr. Alena Bills wants her to reach out to her PCP, (were the patient goes once a month to get her B12 injections). Patient understood.

## 2023-09-08 ENCOUNTER — Ambulatory Visit: Payer: Medicare PPO | Admitting: Dermatology

## 2023-09-08 VITALS — BP 125/76 | HR 68

## 2023-09-08 DIAGNOSIS — Z85828 Personal history of other malignant neoplasm of skin: Secondary | ICD-10-CM

## 2023-09-08 DIAGNOSIS — D229 Melanocytic nevi, unspecified: Secondary | ICD-10-CM

## 2023-09-08 DIAGNOSIS — Z1283 Encounter for screening for malignant neoplasm of skin: Secondary | ICD-10-CM

## 2023-09-08 DIAGNOSIS — D1801 Hemangioma of skin and subcutaneous tissue: Secondary | ICD-10-CM | POA: Diagnosis not present

## 2023-09-08 DIAGNOSIS — L821 Other seborrheic keratosis: Secondary | ICD-10-CM | POA: Diagnosis not present

## 2023-09-08 DIAGNOSIS — L814 Other melanin hyperpigmentation: Secondary | ICD-10-CM

## 2023-09-08 DIAGNOSIS — L578 Other skin changes due to chronic exposure to nonionizing radiation: Secondary | ICD-10-CM

## 2023-09-08 DIAGNOSIS — W908XXA Exposure to other nonionizing radiation, initial encounter: Secondary | ICD-10-CM

## 2023-09-08 NOTE — Patient Instructions (Addendum)
Seborrheic Keratosis  What causes seborrheic keratoses? Seborrheic keratoses are harmless, common skin growths that first appear during adult life.  As time goes by, more growths appear.  Some people may develop a large number of them.  Seborrheic keratoses appear on both covered and uncovered body parts.  They are not caused by sunlight.  The tendency to develop seborrheic keratoses can be inherited.  They vary in color from skin-colored to gray, brown, or even black.  They can be either smooth or have a rough, warty surface.   Seborrheic keratoses are superficial and look as if they were stuck on the skin.  Under the microscope this type of keratosis looks like layers upon layers of skin.  That is why at times the top layer may seem to fall off, but the rest of the growth remains and re-grows.    Treatment Seborrheic keratoses do not need to be treated, but can easily be removed in the office.  Seborrheic keratoses often cause symptoms when they rub on clothing or jewelry.  Lesions can be in the way of shaving.  If they become inflamed, they can cause itching, soreness, or burning.  Removal of a seborrheic keratosis can be accomplished by freezing, burning, or surgery. If any spot bleeds, scabs, or grows rapidly, please return to have it checked, as these can be an indication of a skin cancer.   Due to recent changes in healthcare laws, you may see results of your pathology and/or laboratory studies on MyChart before the doctors have had a chance to review them. We understand that in some cases there may be results that are confusing or concerning to you. Please understand that not all results are received at the same time and often the doctors may need to interpret multiple results in order to provide you with the best plan of care or course of treatment. Therefore, we ask that you please give [place] 2 business days to thoroughly review all your results before contacting the office for clarification.  Should we see a critical lab result, you will be contacted sooner.   If You Need Anything After Your Visit  If you have any questions or concerns for your doctor, please call our main line at [phone] and press option 4 to reach your doctor's medical assistant. If no one answers, please leave a voicemail as directed and we will return your call as soon as possible. Messages left after 4 pm will be answered the following business day.   You may also send [place] a message via MyChart. We typically respond to MyChart messages within 1-2 business days.  For prescription refills, please ask your pharmacy to contact our office. Our fax number is [phone].  If you have an urgent issue when the clinic is closed that cannot wait until the next business day, you can page your doctor at the number below.    Please note that while we do our best to be available for urgent issues outside of office hours, we are not available 24/7.   If you have an urgent issue and are unable to reach [place], you may choose to seek medical care at your doctor's office, retail clinic, urgent care center, or emergency room.  If you have a medical emergency, please immediately call 911 or go to the emergency department.  Pager Numbers  - Dr. Gwen Pounds: [phone]  - Dr. Roseanne Reno: [phone]  - Dr. Katrinka Blazing: [phone]   In the event of inclement weather, please call our main  line at [phone] for an update on the status of any delays or closures.  Dermatology Medication Tips: Please keep the boxes that topical medications come in in order to help keep track of the instructions about where and how to use these. Pharmacies typically print the medication instructions only on the boxes and not directly on the medication tubes.   If your medication is too expensive, please contact our office at [phone] option 4 or send [place] a message through MyChart.   We are unable to tell what your co-pay for medications will be  in advance as this is different depending on your insurance coverage. However, we may be able to find a substitute medication at lower cost or fill out paperwork to get insurance to cover a needed medication.   If a prior authorization is required to get your medication covered by your insurance company, please allow [place] 1-2 business days to complete this process.  Drug prices often vary depending on where the prescription is filled and some pharmacies may offer cheaper prices.  The website www.goodrx.com contains coupons for medications through different pharmacies. The prices here do not account for what the cost may be with help from insurance (it may be cheaper with your insurance), but the website can give you the price if you did not use any insurance.  - You can print the associated coupon and take it with your prescription to the pharmacy.  - You may also stop by our office during regular business hours and pick up a GoodRx coupon card.  - If you need your prescription sent electronically to a different pharmacy, notify our office through [facility] or by phone at [phone] option 4.     Si Usted Necesita Algo Despus de Su Visita  Tambin puede enviarnos un mensaje a travs de Clinical cytogeneticist. Por lo general respondemos a los mensajes de MyChart en el transcurso de 1 a 2 das hbiles.  Para renovar recetas, por favor pida a su farmacia que se ponga en contacto con nuestra oficina. Annie Sable de fax es [employer] [phone].  Si tiene un asunto urgente cuando la clnica est cerrada y que no puede esperar hasta el siguiente da hbil, puede llamar/localizar a su doctor(a) al nmero que aparece a continuacin.   Por favor, tenga en cuenta que aunque hacemos todo lo posible para estar disponibles para asuntos urgentes fuera del horario de Miguel Barrera, no estamos disponibles las 24 horas del da, los 7 [address]  [address] de la [place].   Si tiene un problema urgente y no puede comunicarse con nosotros,  puede optar por buscar atencin mdica  en el consultorio de su doctor(a), en una clnica privada, en un centro de atencin urgente o en una sala de emergencias.  Si tiene Engineer, drilling, por favor llame inmediatamente al 911 o vaya a la sala de emergencias.  Nmeros de bper  - Dr. Gwen Pounds: [phone]  - Dra. Roseanne Reno: [phone]  - Dr. Katrinka Blazing: [phone]   En caso de inclemencias del tiempo, por favor llame a Lacy Duverney principal al [phone] para una actualizacin sobre el [place] de cualquier retraso o cierre.  Consejos para la medicacin en dermatologa: Por favor, guarde las cajas en las que vienen los medicamentos de uso tpico para ayudarle a seguir las instrucciones sobre dnde y cmo usarlos. Las farmacias generalmente imprimen las instrucciones del medicamento slo en las cajas y no directamente en los tubos del [place].   Si su medicamento es [address], por favor, pngase  en contacto con nuestra oficina llamando al [phone] y presione la opcin 4 o envenos un mensaje a travs de Clinical cytogeneticist.   No podemos decirle cul ser su copago por los medicamentos por adelantado ya que esto es diferente dependiendo de la cobertura de su seguro. Sin embargo, es posible que podamos encontrar un medicamento sustituto a Audiological scientist un formulario para que el seguro cubra el medicamento que se considera necesario.   Si se requiere una autorizacin previa para que su compaa de seguros [place] su medicamento, por favor permtanos de 1 a 2 das hbiles para completar [address]  Los precios de los medicamentos varan con frecuencia dependiendo del Environmental consultant de dnde se surte la receta y alguna farmacias pueden ofrecer precios ms baratos.  El sitio web www.goodrx.com tiene cupones para medicamentos de Health and safety inspector. Los precios aqu no tienen en cuenta lo que podra costar con la ayuda del seguro (puede ser ms barato con su seguro), pero el sitio web puede darle  el precio si no utiliz Tourist information centre manager.  - Puede imprimir el cupn correspondiente y llevarlo con su receta a la farmacia.  - Tambin puede pasar por nuestra oficina durante el horario de atencin regular y Education officer, museum una tarjeta de cupones de GoodRx.  - Si necesita que su receta se enve electrnicamente a una farmacia diferente, informe a nuestra oficina a travs de MyChart de [place] o por telfono llamando al [phone] y presione la opcin 4.

## 2023-09-08 NOTE — Progress Notes (Signed)
   Follow-Up Visit   Subjective  Hailey Hawkins is a 77 y.o. female who presents for the following: Skin Cancer Screening and Upper Body Skin Exam  The patient presents for Upper Body Skin Exam (UBSE) for skin cancer screening and mole check. The patient has spots, moles and lesions to be evaluated, some may be new or changing. 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 She has a growth under right breast and right lower leg.   The following portions of the chart were reviewed this encounter and updated as appropriate: medications, allergies, medical history  Review of Systems:  No other skin or systemic complaints except as noted in HPI or Assessment and Plan.  Objective  Well appearing patient in no apparent distress; mood and affect are within normal limits.  All skin waist up examined. Relevant physical exam findings are noted in the Assessment and Plan.    Assessment & Plan    Skin cancer screening performed today.  Actinic Damage - Chronic condition, secondary to cumulative UV/sun exposure - diffuse scaly erythematous macules with underlying dyspigmentation - Recommend daily broad spectrum sunscreen SPF 30+ to sun-exposed areas, reapply every 2 hours as needed.  - Staying in the shade or wearing long sleeves, sun glasses (UVA+UVB protection) and wide brim hats (4-inch brim around the entire circumference of the hat) are also recommended for sun protection.  - Call for new or changing lesions.  Lentigines, Hemangiomas - Benign normal skin lesions - Benign-appearing - Call for any changes  SEBORRHEIC KERATOSIS - Stuck-on, waxy, tan-brown papules and/or plaques, including right inframammary and right lower leg  - Benign-appearing - Discussed benign etiology and prognosis. - Observe - Call for any changes  Melanocytic Nevi - Tan-brown and/or pink-flesh-colored symmetric macules and papules - Benign appearing on exam today -  Observation - Call clinic for new or changing moles - Recommend daily use of broad spectrum spf 30+ sunscreen to sun-exposed areas.   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  - Call if any new or changing lesions are noted between office visits   Return in about 1 year (around 09/07/2024) for TBSE, Hx BCC.  ICherlyn Labella, CMA, am acting as scribe for Willeen Niece, MD .   Documentation: I have reviewed the above documentation for accuracy and completeness, and I agree with the above.  Willeen Niece, MD

## 2023-09-23 ENCOUNTER — Other Ambulatory Visit: Payer: Self-pay | Admitting: Internal Medicine

## 2023-09-23 DIAGNOSIS — M150 Primary generalized (osteo)arthritis: Secondary | ICD-10-CM | POA: Insufficient documentation

## 2023-09-23 DIAGNOSIS — R1900 Intra-abdominal and pelvic swelling, mass and lump, unspecified site: Secondary | ICD-10-CM

## 2023-09-29 ENCOUNTER — Other Ambulatory Visit: Payer: Medicare PPO

## 2023-11-02 ENCOUNTER — Ambulatory Visit: Admission: RE | Admit: 2023-11-02 | Payer: Medicare PPO | Source: Home / Self Care

## 2023-11-02 SURGERY — COLONOSCOPY WITH PROPOFOL
Anesthesia: General

## 2024-01-27 DIAGNOSIS — I2510 Atherosclerotic heart disease of native coronary artery without angina pectoris: Secondary | ICD-10-CM | POA: Diagnosis not present

## 2024-01-27 DIAGNOSIS — R002 Palpitations: Secondary | ICD-10-CM | POA: Diagnosis not present

## 2024-01-27 DIAGNOSIS — I77810 Thoracic aortic ectasia: Secondary | ICD-10-CM | POA: Diagnosis not present

## 2024-01-27 DIAGNOSIS — R0602 Shortness of breath: Secondary | ICD-10-CM | POA: Diagnosis not present

## 2024-01-27 DIAGNOSIS — I480 Paroxysmal atrial fibrillation: Secondary | ICD-10-CM | POA: Diagnosis not present

## 2024-01-27 DIAGNOSIS — I10 Essential (primary) hypertension: Secondary | ICD-10-CM | POA: Diagnosis not present

## 2024-01-27 DIAGNOSIS — I350 Nonrheumatic aortic (valve) stenosis: Secondary | ICD-10-CM | POA: Diagnosis not present

## 2024-03-17 ENCOUNTER — Inpatient Hospital Stay: Payer: Medicare PPO | Attending: Internal Medicine

## 2024-03-17 ENCOUNTER — Inpatient Hospital Stay: Payer: Medicare PPO | Admitting: Internal Medicine

## 2024-03-17 ENCOUNTER — Encounter: Payer: Self-pay | Admitting: Internal Medicine

## 2024-04-04 ENCOUNTER — Observation Stay (HOSPITAL_BASED_OUTPATIENT_CLINIC_OR_DEPARTMENT_OTHER)
Admit: 2024-04-04 | Discharge: 2024-04-04 | Disposition: A | Discharge status: Home / Self Care | Attending: Internal Medicine

## 2024-04-04 ENCOUNTER — Emergency Department

## 2024-04-04 ENCOUNTER — Observation Stay
Admission: EM | Admit: 2024-04-04 | Discharge: 2024-04-08 | Disposition: A | Discharge status: Home Health | Attending: Internal Medicine | Admitting: Internal Medicine

## 2024-04-04 ENCOUNTER — Other Ambulatory Visit: Payer: Self-pay

## 2024-04-04 ENCOUNTER — Encounter: Payer: Self-pay | Admitting: Emergency Medicine

## 2024-04-04 DIAGNOSIS — Z7984 Long term (current) use of oral hypoglycemic drugs: Secondary | ICD-10-CM | POA: Diagnosis not present

## 2024-04-04 DIAGNOSIS — E039 Hypothyroidism, unspecified: Secondary | ICD-10-CM | POA: Diagnosis not present

## 2024-04-04 DIAGNOSIS — Z85828 Personal history of other malignant neoplasm of skin: Secondary | ICD-10-CM | POA: Diagnosis not present

## 2024-04-04 DIAGNOSIS — R9431 Abnormal electrocardiogram [ECG] [EKG]: Secondary | ICD-10-CM | POA: Insufficient documentation

## 2024-04-04 DIAGNOSIS — I480 Paroxysmal atrial fibrillation: Secondary | ICD-10-CM | POA: Diagnosis not present

## 2024-04-04 DIAGNOSIS — E1165 Type 2 diabetes mellitus with hyperglycemia: Secondary | ICD-10-CM | POA: Diagnosis not present

## 2024-04-04 DIAGNOSIS — Z853 Personal history of malignant neoplasm of breast: Secondary | ICD-10-CM | POA: Insufficient documentation

## 2024-04-04 DIAGNOSIS — Z96653 Presence of artificial knee joint, bilateral: Secondary | ICD-10-CM | POA: Insufficient documentation

## 2024-04-04 DIAGNOSIS — E876 Hypokalemia: Secondary | ICD-10-CM | POA: Insufficient documentation

## 2024-04-04 DIAGNOSIS — R451 Restlessness and agitation: Secondary | ICD-10-CM | POA: Insufficient documentation

## 2024-04-04 DIAGNOSIS — Z79899 Other long term (current) drug therapy: Secondary | ICD-10-CM | POA: Insufficient documentation

## 2024-04-04 DIAGNOSIS — R55 Syncope and collapse: Principal | ICD-10-CM

## 2024-04-04 DIAGNOSIS — Z7901 Long term (current) use of anticoagulants: Secondary | ICD-10-CM | POA: Diagnosis not present

## 2024-04-04 DIAGNOSIS — N390 Urinary tract infection, site not specified: Secondary | ICD-10-CM | POA: Diagnosis not present

## 2024-04-04 DIAGNOSIS — E871 Hypo-osmolality and hyponatremia: Secondary | ICD-10-CM | POA: Insufficient documentation

## 2024-04-04 DIAGNOSIS — R42 Dizziness and giddiness: Secondary | ICD-10-CM | POA: Insufficient documentation

## 2024-04-04 DIAGNOSIS — Z794 Long term (current) use of insulin: Secondary | ICD-10-CM | POA: Diagnosis not present

## 2024-04-04 DIAGNOSIS — R531 Weakness: Secondary | ICD-10-CM

## 2024-04-04 DIAGNOSIS — J45909 Unspecified asthma, uncomplicated: Secondary | ICD-10-CM | POA: Insufficient documentation

## 2024-04-04 DIAGNOSIS — E119 Type 2 diabetes mellitus without complications: Secondary | ICD-10-CM

## 2024-04-04 DIAGNOSIS — I10 Essential (primary) hypertension: Secondary | ICD-10-CM | POA: Diagnosis not present

## 2024-04-04 DIAGNOSIS — G9341 Metabolic encephalopathy: Secondary | ICD-10-CM | POA: Diagnosis not present

## 2024-04-04 LAB — CBC
HCT: 35.4 % — ABNORMAL LOW (ref 36.0–46.0)
Hemoglobin: 11.9 g/dL — ABNORMAL LOW (ref 12.0–15.0)
MCH: 29.4 pg (ref 26.0–34.0)
MCHC: 33.6 g/dL (ref 30.0–36.0)
MCV: 87.4 fL (ref 80.0–100.0)
Platelets: 199 10*3/uL (ref 150–400)
RBC: 4.05 MIL/uL (ref 3.87–5.11)
RDW: 12.2 % (ref 11.5–15.5)
WBC: 7.5 10*3/uL (ref 4.0–10.5)
nRBC: 0 % (ref 0.0–0.2)

## 2024-04-04 LAB — BASIC METABOLIC PANEL WITH GFR
Anion gap: 10 (ref 5–15)
BUN: 24 mg/dL — ABNORMAL HIGH (ref 8–23)
CO2: 24 mmol/L (ref 22–32)
Calcium: 9 mg/dL (ref 8.9–10.3)
Chloride: 101 mmol/L (ref 98–111)
Creatinine, Ser: 0.98 mg/dL (ref 0.44–1.00)
GFR, Estimated: 59 mL/min — ABNORMAL LOW (ref >60)
Glucose, Bld: 352 mg/dL — ABNORMAL HIGH (ref 70–99)
Potassium: 4.1 mmol/L (ref 3.5–5.1)
Sodium: 135 mmol/L (ref 135–145)

## 2024-04-04 LAB — HEMOGLOBIN A1C
Hgb A1c MFr Bld: 7.4 % — ABNORMAL HIGH (ref 4.8–5.6)
Mean Plasma Glucose: 166 mg/dL

## 2024-04-04 LAB — URINALYSIS, ROUTINE W REFLEX MICROSCOPIC
Bilirubin Urine: NEGATIVE
Glucose, UA: >=500 mg/dL — AB
Hgb urine dipstick: NEGATIVE
Ketones, ur: NEGATIVE mg/dL
Nitrite: NEGATIVE
Protein, ur: 30 mg/dL — AB
Specific Gravity, Urine: 1.03 (ref 1.005–1.030)
WBC, UA: >50 WBC/hpf (ref 0–5)
pH: 5 (ref 5.0–8.0)

## 2024-04-04 LAB — CBG MONITORING, ED
Glucose-Capillary: 252 mg/dL — ABNORMAL HIGH (ref 70–99)
Glucose-Capillary: 117 mg/dL — ABNORMAL HIGH (ref 70–99)
Glucose-Capillary: 143 mg/dL — ABNORMAL HIGH (ref 70–99)

## 2024-04-04 LAB — PHOSPHORUS: Phosphorus: 4 mg/dL (ref 2.5–4.6)

## 2024-04-04 LAB — MAGNESIUM: Magnesium: 1.9 mg/dL (ref 1.7–2.4)

## 2024-04-04 MED ORDER — SODIUM CHLORIDE 0.9% FLUSH
3.0000 mL | Freq: Two times a day (BID) | INTRAVENOUS | Status: DC
  Administered 2024-04-05 – 2024-04-08 (×6): 3 mL via INTRAVENOUS

## 2024-04-04 MED ORDER — ATORVASTATIN CALCIUM 10 MG PO TABS
10.0000 mg | ORAL_TABLET | Freq: Every day | ORAL | Status: DC
  Administered 2024-04-05 – 2024-04-08 (×4): 10 mg via ORAL
  Filled 2024-04-04 (×4): qty 1

## 2024-04-04 MED ORDER — ACETAMINOPHEN 325 MG PO TABS
650.0000 mg | ORAL_TABLET | ORAL | Status: DC | PRN
  Administered 2024-04-04 – 2024-04-07 (×3): 650 mg via ORAL
  Filled 2024-04-04 (×3): qty 2

## 2024-04-04 MED ORDER — ALBUTEROL SULFATE (2.5 MG/3ML) 0.083% IN NEBU: 3.0000 mL | INHALATION_SOLUTION | RESPIRATORY_TRACT | Status: DC | PRN

## 2024-04-04 MED ORDER — LACTATED RINGERS IV BOLUS
1000.0000 mL | Freq: Once | INTRAVENOUS | Status: AC
  Administered 2024-04-04: 1000 mL via INTRAVENOUS

## 2024-04-04 MED ORDER — QUETIAPINE FUMARATE 25 MG PO TABS
50.0000 mg | ORAL_TABLET | Freq: Every day | ORAL | Status: DC
  Administered 2024-04-04 – 2024-04-07 (×4): 50 mg via ORAL
  Filled 2024-04-04 (×4): qty 2

## 2024-04-04 MED ORDER — APIXABAN 5 MG PO TABS
5.0000 mg | ORAL_TABLET | Freq: Two times a day (BID) | ORAL | Status: DC
  Administered 2024-04-04 – 2024-04-08 (×8): 5 mg via ORAL
  Filled 2024-04-04 (×8): qty 1

## 2024-04-04 MED ORDER — ACETAMINOPHEN 325 MG PO TABS
650.0000 mg | ORAL_TABLET | Freq: Once | ORAL | Status: AC
  Administered 2024-04-04: 650 mg via ORAL
  Filled 2024-04-04: qty 2

## 2024-04-04 MED ORDER — ISOSORBIDE MONONITRATE ER 60 MG PO TB24
30.0000 mg | ORAL_TABLET | Freq: Two times a day (BID) | ORAL | Status: DC
  Administered 2024-04-04 – 2024-04-05 (×2): 30 mg via ORAL
  Filled 2024-04-04 (×2): qty 1

## 2024-04-04 MED ORDER — ONDANSETRON HCL 4 MG/2ML IJ SOLN: 4.0000 mg | Freq: Four times a day (QID) | INTRAMUSCULAR | Status: DC | PRN

## 2024-04-04 MED ORDER — VENLAFAXINE HCL ER 37.5 MG PO CP24
37.5000 mg | ORAL_CAPSULE | Freq: Every day | ORAL | Status: DC
  Administered 2024-04-05 – 2024-04-08 (×4): 37.5 mg via ORAL
  Filled 2024-04-04 (×5): qty 1

## 2024-04-04 MED ORDER — LEVOTHYROXINE SODIUM 50 MCG PO TABS
75.0000 ug | ORAL_TABLET | Freq: Every day | ORAL | Status: DC
  Administered 2024-04-05 – 2024-04-08 (×4): 75 ug via ORAL
  Filled 2024-04-04 (×2): qty 1
  Filled 2024-04-04: qty 2
  Filled 2024-04-04: qty 1

## 2024-04-04 MED ORDER — PROPRANOLOL HCL 20 MG PO TABS
20.0000 mg | ORAL_TABLET | Freq: Two times a day (BID) | ORAL | Status: DC
  Administered 2024-04-04 – 2024-04-05 (×2): 20 mg via ORAL
  Filled 2024-04-04 (×2): qty 1

## 2024-04-04 MED ORDER — ONDANSETRON HCL 4 MG PO TABS: 4.0000 mg | ORAL_TABLET | Freq: Four times a day (QID) | ORAL | Status: DC | PRN

## 2024-04-04 MED ORDER — HYDRALAZINE HCL 50 MG PO TABS
50.0000 mg | ORAL_TABLET | Freq: Three times a day (TID) | ORAL | Status: DC
  Administered 2024-04-04 – 2024-04-05 (×3): 50 mg via ORAL
  Filled 2024-04-04 (×3): qty 1

## 2024-04-04 MED ORDER — INSULIN GLARGINE-YFGN 100 UNIT/ML ~~LOC~~ SOLN
15.0000 [IU] | SUBCUTANEOUS | Status: DC
  Administered 2024-04-04 – 2024-04-06 (×2): 15 [IU] via SUBCUTANEOUS
  Filled 2024-04-04 (×4): qty 0.15

## 2024-04-04 MED ORDER — HYDRALAZINE HCL 20 MG/ML IJ SOLN
5.0000 mg | Freq: Four times a day (QID) | INTRAMUSCULAR | Status: DC | PRN
Start: 2024-04-04 — End: 2024-04-05

## 2024-04-04 MED ORDER — INSULIN ASPART 100 UNIT/ML IJ SOLN
0.0000 [IU] | Freq: Every day | INTRAMUSCULAR | Status: DC
  Administered 2024-04-06: 4 [IU] via SUBCUTANEOUS
  Filled 2024-04-04: qty 1

## 2024-04-04 MED ORDER — PANTOPRAZOLE SODIUM 40 MG PO TBEC
40.0000 mg | DELAYED_RELEASE_TABLET | Freq: Every day | ORAL | Status: DC
  Administered 2024-04-04 – 2024-04-07 (×4): 40 mg via ORAL
  Filled 2024-04-04 (×4): qty 1

## 2024-04-04 MED ORDER — ALPRAZOLAM 0.25 MG PO TABS: 0.2500 mg | ORAL_TABLET | Freq: Every evening | ORAL | Status: DC | PRN

## 2024-04-04 MED ORDER — INSULIN ASPART 100 UNIT/ML IJ SOLN
0.0000 [IU] | Freq: Three times a day (TID) | INTRAMUSCULAR | Status: DC
  Administered 2024-04-04: 11 [IU] via SUBCUTANEOUS
  Administered 2024-04-05: 4 [IU] via SUBCUTANEOUS
  Administered 2024-04-05: 11 [IU] via SUBCUTANEOUS
  Administered 2024-04-06: 4 [IU] via SUBCUTANEOUS
  Administered 2024-04-06: 11 [IU] via SUBCUTANEOUS
  Administered 2024-04-06: 4 [IU] via SUBCUTANEOUS
  Administered 2024-04-07: 15 [IU] via SUBCUTANEOUS
  Administered 2024-04-07: 7 [IU] via SUBCUTANEOUS
  Filled 2024-04-04 (×8): qty 1

## 2024-04-04 NOTE — ED Provider Triage Note (Signed)
 Emergency Medicine Provider Triage Evaluation Note  Hailey Hawkins , a 78 y.o. female  was evaluated in triage.  Pt complains of fall at home and general malaise.  Multiple falls including one witnessed by EMS.  Patient feels better now, denies any pain and SOB.  Review of Systems  Positive: falls, general malaise Negative: chest pain, SOB, abdominal pain, vomiting  Physical Exam  BP 91/62   Pulse 62   Temp 98 F (36.7 C) (Oral)   Resp 18   Wt 83.9 kg   SpO2 95%   BMI 30.78 kg/m  Gen:   Awake, no distress.  Alert and oriented. Resp:  Normal effort, no accessory muscle usage MSK:   Moves extremities without difficulty  Other:  Denies CP and SOB  Medical Decision Making  Medically screening exam initiated at 6:31 AM.  Appropriate orders placed.  Hailey Hawkins was informed that the remainder of the evaluation will be completed by another provider, this initial triage assessment does not replace that evaluation, and the importance of remaining in the ED until their evaluation is complete.     Loleta Rose, MD 04/04/24 [phone]

## 2024-04-04 NOTE — ED Provider Notes (Signed)
 [facility] Provider Note    Event Date/Time   First MD Initiated Contact with Patient 04/04/24 [phone]     (approximate)   History   Loss of Consciousness and Fall Pt in from home via [facility] after mechanical fall while walking to bathroom. Upon EMS arrival, pt syncopized when standing, and they assisted her fall. No reported head trauma or thinners. Pt does have +low back and bilateral knee pain post-fall. Just finished abx for UTI, pt states these symptoms are unimproved. Arrives a&ox3, slight confusion from baseline per husband on scene.  VS w/ems: 90/40 70HR 96%RA 97.7 ETCO2 38 CBG 331   HPI  Hailey Hawkins is a 78 y.o. female PMH multiple comorbidities including anemia, hyperlipidemia, chronic hyponatremia, SIADH, T2DM, remote history of breast cancer presents for evaluation of a loss of consciousness episode with fall -Per EMS, patient reported having a mechanical fall while walking to the bathroom.  Patient then had a syncopal episode witnessed by EMS when they were assisting her. -On my evaluation, patient states she did not get up early in the morning to go to the bathroom, feels she might of felt lightheaded and believes she passed out.  Unclear if she remembers hitting the floor.  Primarily complaining of mid and low back pain as well as bilateral knee pain.  Confirms that she did pass out again with EMS, notes that she does not pass out frequently. -States she was treated with about 10 days of an unspecified antibiotic for a UTI.  No flank pain or fever, no vomiting or dysuria but does endorse ongoing urinary frequency. -She was too weak to get up after her initial fall.  Husband heard her fall, saw her immediately, no prolonged downtime or prolonged loss of consciousness.      Physical Exam   Triage Vital Signs: ED Triage Vitals  Encounter Vitals Group     BP 04/04/24 0546 91/62     Systolic BP Percentile --      Diastolic BP Percentile --       Pulse Rate 04/04/24 0541 62     Resp 04/04/24 0541 18     Temp 04/04/24 0541 98 F (36.7 C)     Temp Source 04/04/24 0541 Oral     SpO2 04/04/24 0547 95 %     Weight 04/04/24 0544 184 lb 15.5 oz (83.9 kg)     Height --      Head Circumference --      Peak Flow --      Pain Score 04/04/24 0544 5     Pain Loc --      Pain Education --      Exclude from Growth Chart --     Most recent vital signs: Vitals:   04/04/24 1219 04/04/24 1325  BP:    Pulse:    Resp:    Temp: 98 F (36.7 C)   SpO2:  94%     General: Awake, no distress.  HEENT: Head atraumatic Neck: No midline C-spine tenderness, full range of motion CV:  Good peripheral perfusion.  RRR, RP 2+.  Murmurs appreciated. Resp:  Normal effort.  CTAB Abd:  No distention.  No no tenderness to deep palpation throughout EXT:  Full range of motion of all joints in bilateral upper and lower extremities, no deformities or bruising appreciated to bilateral upper and lower extremities.  Does have mild tenderness to bilateral knees. Back:  Midline tenderness and T-spine and L-spine.  No  CVA tenderness. PELVIS: stable Neuro: No extremity spontaneously, no focal motor deficits appreciated   ED Results / Procedures / Treatments   Labs (all labs ordered are listed, but only abnormal results are displayed) Labs Reviewed  BASIC METABOLIC PANEL WITH GFR - Abnormal; Notable for the following components:      Result Value   Glucose, Bld 352 (*)    BUN 24 (*)    GFR, Estimated 59 (*)    All other components within normal limits  CBC - Abnormal; Notable for the following components:   Hemoglobin 11.9 (*)    HCT 35.4 (*)    All other components within normal limits  URINALYSIS, ROUTINE W REFLEX MICROSCOPIC - Abnormal; Notable for the following components:   Color, Urine YELLOW (*)    APPearance CLOUDY (*)    Glucose, UA >=500 (*)    Protein, ur 30 (*)    Leukocytes,Ua LARGE (*)    Bacteria, UA RARE (*)    All other  components within normal limits  CBG MONITORING, ED - Abnormal; Notable for the following components:   Glucose-Capillary 252 (*)    All other components within normal limits  MAGNESIUM  PHOSPHORUS  HEMOGLOBIN A1C     EKG  ECG = sinus rhythm, rate 69, no appreciable ST elevation or depression, some diffuse biphasic T waves   RADIOLOGY See ED course below    PROCEDURES:  Critical Care performed: No  Procedures   MEDICATIONS ORDERED IN ED: Medications  sodium chloride flush (NS) 0.9 % injection 3 mL (3 mLs Intravenous Not Given 04/04/24 1056)  ondansetron (ZOFRAN) tablet 4 mg (has no administration in time range)    Or  ondansetron (ZOFRAN) injection 4 mg (has no administration in time range)  hydrALAZINE (APRESOLINE) injection 5 mg (has no administration in time range)  insulin aspart (novoLOG) injection 0-5 Units (has no administration in time range)  insulin aspart (novoLOG) injection 0-20 Units (11 Units Subcutaneous Given 04/04/24 1217)  insulin glargine-yfgn (SEMGLEE) injection 15 Units (has no administration in time range)  lactated ringers bolus 1,000 mL (0 mLs Intravenous Stopped 04/04/24 1056)  acetaminophen (TYLENOL) tablet 650 mg (650 mg Oral Given 04/04/24 0738)     IMPRESSION / MDM / ASSESSMENT AND PLAN / ED COURSE  I reviewed the triage vital signs and the nursing notes.                              Differential diagnosis includes, but is not limited to, arrhythmia, consider vasovagal syncope episode, consider persistent underlying UTI contributing to weakness, consider other infectious etiology such as pneumonia despite no symptoms.  With regard to her associated trauma, consider intracranial hemorrhage or skull fracture, do not clinically suspect C-spine injury at this time.  Do however consider T or L-spine injury given point tenderness in these regions.  Doubt pelvic fracture or bilateral knee fracture, will screen with x-rays.  Given recurrent syncope,  anticipate need for admission.   Patient's presentation is most consistent with acute presentation with potential threat to life or bodily function.  The patient is on the cardiac monitor to evaluate for evidence of arrhythmia and/or significant heart rate changes.   ED course below.  Workup with no traumatic injuries identified, laboratory workup unremarkable, EKG with no acute findings on my read.  Appears last echo on chart review was about 2 years ago, do believe patient may benefit from consideration of repeat echo and  telemetry monitoring.  Admitted to hospitalist service for evaluation and monitoring of recurrent syncope.   Clinical Course as of 04/04/24 1516  Mon Apr 04, 2024  0820 CT head reviewed, unremarkable on my interpretation  Formal read below: IMPRESSION: 1. No acute intracranial abnormalities. 2. Chronic microvascular disease. 3. Mild paranasal sinus disease.   [MM]  0837 Cbc w/ no leukocytosis, overall stable anemia [MM]  0844 XRAys pelvis, chest, b/l knees reviewed, interpreted by myself, unremarkable  Radiology reviewed, unremarkable  CT T, L spines reviewed, interpreted by myself, unremarkable. Radiology reports reviewed.  [MM]  0922 BMP with hyperglycemia, did receive LR, can trend.  Fortunately not consistent with DKA.  Hospitalist consult order placed. [MM]  [phone] Presented to Dr. Chipper Herb for admission [MM]    Clinical Course User Index [MM] Marinell Blight, MD     FINAL CLINICAL IMPRESSION(S) / ED DIAGNOSES   Final diagnoses:  Syncope and collapse  Recurrent syncope  Weakness     Rx / DC Orders   ED Discharge Orders     None        Note:  This document was prepared using Dragon voice recognition software and may include unintentional dictation errors.   Marinell Blight, MD 04/04/24 [phone]

## 2024-04-04 NOTE — Inpatient Diabetes Management (Signed)
 Inpatient Diabetes Program Recommendations  AACE/ADA: New Consensus Statement on Inpatient Glycemic Control (2015)  Target Ranges:  Prepandial:   less than 140 mg/dL      Peak postprandial:   less than 180 mg/dL (1-2 hours)      Critically ill patients:  140 - 180 mg/dL   Lab Results  Component Value Date   GLUCAP 252 (H) 04/04/2024   HGBA1C 12.3 (H) 04/21/2013    Review of Glycemic Control  Latest Reference Range & Units 04/04/24 12:12  Glucose-Capillary 70 - 99 mg/dL 161 (H)   Diabetes history: DM  Outpatient Diabetes medications:  Lantus 20 units q HS Jardiance 10 mg daily Metformin 1000 mg bid Current orders for Inpatient glycemic control:  Novolog 0-20 units tid with meals and HS  Inpatient Diabetes Program Recommendations:    Please consider adding Semglee 15 units q HS and reduce Novolog to moderate (0-15 units) tid with meals.   Thanks,  Lorenza Cambridge, RN, BC-ADM Inpatient Diabetes Coordinator Pager [phone]  (8a-5p)

## 2024-04-04 NOTE — ED Notes (Signed)
 Pt ambulated with standby assist to bathroom, denies any dizziness. Ambulated steadily, but complained of knee pain throughout, asked for Tylenol.

## 2024-04-04 NOTE — Evaluation (Signed)
 Physical Therapy Evaluation Patient Details Name: Hailey Hawkins MRN: [id number] DOB: [birth year] Today's Date: 04/04/2024  History of Present Illness  Pt is a 78 y.o. female presenting to hospital 04/04/24 with c/o syncope.  Pt admitted with syncope and fall, PAF.  PMH includes PAF on Eliquis, refractory htn, mild aortic stenosis, R breast CA stage Ia s/p chemo/immunotherapy/lumpectomy, IIDDM, HLD, asthma/COPD, B TKR.  Clinical Impression  Prior to recent medical concerns, pt reports being independent with ambulation; lives with her husband in 1 level senior condo; 1 small step to enter.  Pt reports B knee and low back pain during activity (none at rest beginning of session).  Currently pt is mod assist semi-supine to sitting EOB; CGA with transfer from ED stretcher bed (x2 trials); and min assist to transfer bed to recliner.  Deferred ambulation d/t pt's c/o dizziness in standing.  OT present with pt end of session (pt requesting to toilet).  Of note, orthostatics taken:  Supine BP 146/90 (MAP 107) with HR 60 bpm; sitting BP 135/89 (MAP 104) with HR 113 bpm; standing BP (at 0 minutes) 124/106 (MAP 102) with HR 92 bpm; and standing BP at 3 minutes BP 123/95 (MAP 105) with HR 93 bpm. Pt reporting 5/10 dizziness in standing.  MD and pt's nurse notified regarding pt's orthostatic vitals and symptoms.   Pt would currently benefit from skilled PT to address noted impairments and functional limitations (see below for any additional details).  Upon hospital discharge, pt would benefit from ongoing therapy.     If plan is discharge home, recommend the following: A little help with walking and/or transfers;A little help with bathing/dressing/bathroom;Assistance with cooking/housework;Assist for transportation;Help with stairs or ramp for entrance   Can travel by private vehicle        Equipment Recommendations Rolling walker (2 wheels);Other (comment) (pt reports having RW at home already)  Recommendations  for Other Services       Functional Status Assessment Patient has had a recent decline in their functional status and demonstrates the ability to make significant improvements in function in a reasonable and predictable amount of time.     Precautions / Restrictions Precautions Precautions: Fall Precaution/Restrictions Comments: Monitor BP Restrictions Weight Bearing Restrictions Per Provider Order: No      Mobility  Bed Mobility Overal bed mobility: Needs Assistance Bed Mobility: Supine to Sit     Supine to sit: Mod assist, HOB elevated     General bed mobility comments: assist for trunk    Transfers Overall transfer level: Needs assistance Equipment used: None Transfers: Sit to/from Stand Sit to Stand: Contact guard assist           General transfer comment: x2 trials standing from bed (pt pushing off of bed with B UE's); stand step turn ED stretcher bed to recliner min assist (no AD use)    Ambulation/Gait               General Gait Details: Deferred d/t dizziness in standing (and BP decreasing)  Stairs            Wheelchair Mobility     Tilt Bed    Modified Rankin (Stroke Patients Only)       Balance Overall balance assessment: Needs assistance Sitting-balance support: No upper extremity supported, Feet supported Sitting balance-Leahy Scale: Good Sitting balance - Comments: steady reaching within BOS   Standing balance support: Single extremity supported Standing balance-Leahy Scale: Fair Standing balance comment: steady static standing balance with single UE  support                             Pertinent Vitals/Pain Pain Assessment Pain Assessment: Faces Faces Pain Scale: Hurts little more Pain Location: low back and B knees with activity Pain Descriptors / Indicators: Aching, Discomfort Pain Intervention(s): Limited activity within patient's tolerance, Monitored during session, Repositioned    Home Living  Family/patient expects to be discharged to:: Private residence Living Arrangements: Spouse/significant other Available Help at Discharge: Family;Available PRN/intermittently Type of Home: Other(Comment) Health visitor) Home Access: Stairs to enter Entrance Stairs-Rails: None Entrance Stairs-Number of Steps: 1 small step   Home Layout: One level Home Equipment: Grab bars - toilet;Grab bars - tub/shower;Cane - single Librarian, academic (2 wheels);Shower seat      Prior Function Prior Level of Function : Independent/Modified Independent                     Extremity/Trunk Assessment   Upper Extremity Assessment Upper Extremity Assessment: Defer to OT evaluation    Lower Extremity Assessment Lower Extremity Assessment: Generalized weakness       Communication   Communication Communication: No apparent difficulties    Cognition Arousal: Alert Behavior During Therapy: WFL for tasks assessed/performed   PT - Cognitive impairments: No apparent impairments                       PT - Cognition Comments: A&Ox4 (although pt reporting still not feeling like she was thinking clearly) Following commands: Impaired Following commands impaired: Follows one step commands with increased time     Cueing Cueing Techniques: Verbal cues     General Comments  Nursing cleared pt for participation in physical therapy.  Pt agreeable to PT session.    Exercises     Assessment/Plan    PT Assessment Patient needs continued PT services  PT Problem List Decreased strength;Decreased activity tolerance;Decreased balance;Decreased mobility;Decreased cognition;Decreased knowledge of use of DME;Decreased knowledge of precautions;Cardiopulmonary status limiting activity;Pain       PT Treatment Interventions DME instruction;Gait training;Stair training;Functional mobility training;Therapeutic activities;Therapeutic exercise;Balance training;Patient/family education    PT Goals  (Current goals can be found in the Care Plan section)  Acute Rehab PT Goals Patient Stated Goal: to improve dizziness PT Goal Formulation: With patient Time For Goal Achievement: 04/18/24 Potential to Achieve Goals: Good    Frequency Min 3X/week     Co-evaluation               AM-PAC PT "6 Clicks" Mobility  Outcome Measure Help needed turning from your back to your side while in a flat bed without using bedrails?: None Help needed moving from lying on your back to sitting on the side of a flat bed without using bedrails?: A Lot Help needed moving to and from a bed to a chair (including a wheelchair)?: A Little Help needed standing up from a chair using your arms (e.g., wheelchair or bedside chair)?: A Little Help needed to walk in hospital room?: A Lot Help needed climbing 3-5 steps with a railing? : A Lot 6 Click Score: 16    End of Session Equipment Utilized During Treatment: Gait belt Activity Tolerance: Other (comment) (limited d/t c/o dizziness in standing) Patient left:  (sitting in recliner with OT present (OT taking pt to bathroom for toileting)) Nurse Communication: Mobility status;Precautions;Other (comment) (pt's BP, HR, and symptoms during session) PT Visit Diagnosis: Unsteadiness on feet (R26.81);Other  abnormalities of gait and mobility (R26.89);Muscle weakness (generalized) (M62.81);History of falling (Z91.81);Pain Pain - Right/Left:  (B) Pain - part of body: Knee (and low back)    Time: 0981-1914 PT Time Calculation (min) (ACUTE ONLY): 23 min   Charges:   PT Evaluation $PT Eval Low Complexity: 1 Low PT Treatments $Therapeutic Activity: 8-22 mins PT General Charges $$ ACUTE PT VISIT: 1 Visit        Hendricks Limes, PT 04/04/24, 2:31 PM

## 2024-04-04 NOTE — ED Triage Notes (Addendum)
 Pt in from home via [facility] after mechanical fall while walking to bathroom. Upon EMS arrival, pt syncopized when standing, and they assisted her fall. No reported head trauma or thinners. Pt does have +low back and bilateral knee pain post-fall. Just finished abx for UTI, pt states these symptoms are unimproved. Arrives a&ox3, slight confusion from baseline per husband on scene.  VS w/ems: 90/40 70HR 96%RA 97.7 ETCO2 38 CBG 331

## 2024-04-04 NOTE — Progress Notes (Signed)
*  PRELIMINARY RESULTS* Echocardiogram 2D Echocardiogram has been performed.  Cristela Blue 04/04/2024, 2:45 PM

## 2024-04-04 NOTE — Evaluation (Signed)
 Occupational Therapy Evaluation Patient Details Name: Hailey Hawkins MRN: [id number] DOB: October 31, 1946 Today's Date: 04/04/2024   History of Present Illness   Pt is a 78 y.o. female presenting to hospital 04/04/24 with c/o syncope.  Pt admitted with syncope and fall, PAF.  PMH includes PAF on Eliquis, refractory htn, mild aortic stenosis, R breast CA stage Ia s/p chemo/immunotherapy/lumpectomy, IIDDM, HLD, asthma/COPD, B TKR.     Clinical Impressions Pt was seen for OT evaluation this date. Prior to hospital admission, pt was living at home with her husband in a one level condo with small step for entry where she was IND with ADLs, IADLs and mobility without AD use. Pt with grab bars in all bathrooms.   Pt presents to acute OT demonstrating impaired ADL performance and functional mobility 2/2 weakness, pain, and dizziness during activity. Pt found with PT in recliner wishing to go to the bathroom. Orthostatic positive with PT with 5/10 dizziness, therefore ambulation deferred, but pt okay to transfer. Pt currently requires Min to CGA for toilet, STS and SPT with grab bar use and RW use. Cueing needed for safety with RW use d/t not typically using one. Min A for LB dressing to doff/don underwear and don new mesh panties d/t L knee pain and limited ROM. Able to stand with CGA to pull over hips. Hand hygiene performed seated in recliner. Mod A for BLE management to return to supine in high gurney. Pt would benefit from skilled OT services to address noted impairments and functional limitations (see below for any additional details) in order to maximize safety and independence while minimizing falls risk and caregiver burden. Do anticipate the need for follow up OT services upon acute hospital DC.      If plan is discharge home, recommend the following:   A little help with walking and/or transfers;A lot of help with bathing/dressing/bathroom;A little help with bathing/dressing/bathroom;Assistance  with cooking/housework;Assist for transportation;Help with stairs or ramp for entrance     Functional Status Assessment   Patient has had a recent decline in their functional status and demonstrates the ability to make significant improvements in function in a reasonable and predictable amount of time.     Equipment Recommendations   BSC/3in1;Tub/shower seat     Recommendations for Other Services         Precautions/Restrictions   Precautions Precautions: Fall Precaution/Restrictions Comments: Monitor BP Restrictions Weight Bearing Restrictions Per Provider Order: No     Mobility Bed Mobility Overal bed mobility: Needs Assistance Bed Mobility: Sit to Supine       Sit to supine: Mod assist, Min assist   General bed mobility comments: BLE management back onto high gurney    Transfers Overall transfer level: Needs assistance Equipment used: Rolling walker (2 wheels) Transfers: Sit to/from Stand, Bed to chair/wheelchair/BSC Sit to Stand: Contact guard assist, Min assist Stand pivot transfers: Contact guard assist, Min assist         General transfer comment: Min A for STS from toilet, then progressed to CGA using grab bars, CGA using grab bars for SPT to toilet from recliner; CGA from toilet to recliner using RW; Min/CGA from recliner back to high gurney using RW with cueing for proper RW use      Balance Overall balance assessment: Needs assistance Sitting-balance support: No upper extremity supported, Feet supported Sitting balance-Leahy Scale: Good     Standing balance support: Reliant on assistive device for balance, During functional activity, Bilateral upper extremity supported Standing balance-Leahy Scale:  Fair Standing balance comment: no LOB with transfers using RW with CGA                           ADL either performed or assessed with clinical judgement   ADL Overall ADL's : Needs assistance/impaired     Grooming: Wash/dry  hands;Sitting;Set up               Lower Body Dressing: Moderate assistance;Sit to/from stand;Sitting/lateral leans;Minimal assistance Lower Body Dressing Details (indicate cue type and reason): assist to get up to her thighs, then pt able to stand and pull over hips in standing with CGA from therapist for balance Toilet Transfer: Minimal assistance;Grab bars;Rolling walker (2 wheels) Toilet Transfer Details (indicate cue type and reason): Min A, RW use of grab bar use as well Toileting- Clothing Manipulation and Hygiene: Supervision/safety;Sitting/lateral lean               Vision         Perception         Praxis         Pertinent Vitals/Pain Pain Assessment Pain Assessment: Faces Faces Pain Scale: Hurts little more Pain Location: low back and B knees with activity Pain Descriptors / Indicators: Aching, Discomfort Pain Intervention(s): Monitored during session, Repositioned, Limited activity within patient's tolerance     Extremity/Trunk Assessment Upper Extremity Assessment Upper Extremity Assessment: Overall WFL for tasks assessed   Lower Extremity Assessment Lower Extremity Assessment: Generalized weakness       Communication Communication Communication: No apparent difficulties   Cognition Arousal: Alert Behavior During Therapy: WFL for tasks assessed/performed                                 Following commands: Impaired Following commands impaired: Follows one step commands with increased time     Cueing  General Comments   Cueing Techniques: Verbal cues  5/10 dizziness during session with positive orthostatics taken by PT   Exercises Other Exercises Other Exercises: Edu on role of OT in acute setting.   Shoulder Instructions      Home Living Family/patient expects to be discharged to:: Private residence Living Arrangements: Spouse/significant other Available Help at Discharge: Family;Available PRN/intermittently Type of  Home: Other(Comment) Health visitor) Home Access: Stairs to enter [employer] of Steps: 1 small step Entrance Stairs-Rails: None Home Layout: One level     Bathroom Shower/Tub: Tub/shower unit;Walk-in shower (Uses tub shower usually)   Bathroom Toilet: Handicapped height     Home Equipment: Grab bars - toilet;Grab bars - tub/shower;Cane - single Librarian, academic (2 wheels);Shower seat          Prior Functioning/Environment Prior Level of Function : Independent/Modified Independent                    OT Problem List: Decreased strength;Decreased activity tolerance;Impaired balance (sitting and/or standing);Pain   OT Treatment/Interventions: Self-care/ADL training;Balance training;Therapeutic exercise;Therapeutic activities;Patient/family education;DME and/or AE instruction      OT Goals(Current goals can be found in the care plan section)   Acute Rehab OT Goals Patient Stated Goal: improve dizziness/function to return home OT Goal Formulation: With patient Time For Goal Achievement: 04/18/24 Potential to Achieve Goals: Fair ADL Goals Pt Will Perform Lower Body Bathing: with supervision;sit to/from stand;sitting/lateral leans Pt Will Perform Lower Body Dressing: with supervision;sit to/from stand;sitting/lateral leans Pt Will Transfer to Toilet: with supervision;with modified  independence;regular height toilet;ambulating Pt Will Perform Toileting - Clothing Manipulation and hygiene: with modified independence;with supervision;sit to/from stand;sitting/lateral leans   OT Frequency:  Min 2X/week    Co-evaluation              AM-PAC OT "6 Clicks" Daily Activity     Outcome Measure Help from another person eating meals?: None Help from another person taking care of personal grooming?: A Little Help from another person toileting, which includes using toliet, bedpan, or urinal?: A Little Help from another person bathing (including washing, rinsing,  drying)?: A Lot Help from another person to put on and taking off regular upper body clothing?: A Little Help from another person to put on and taking off regular lower body clothing?: A Lot 6 Click Score: 17   End of Session Equipment Utilized During Treatment: Rolling walker (2 wheels);Gait belt Nurse Communication: Mobility status  Activity Tolerance: Patient tolerated treatment well Patient left: in bed;with call bell/phone within reach;with bed alarm set  OT Visit Diagnosis: Other abnormalities of gait and mobility (R26.89);Muscle weakness (generalized) (M62.81);Dizziness and giddiness (R42)                Time: 5784-6962 OT Time Calculation (min): 23 min Charges:  OT General Charges $OT Visit: 1 Visit OT Evaluation $OT Eval Moderate Complexity: 1 Mod OT Treatments $Self Care/Home Management : 8-22 mins Wakeelah Solan, OTR/L  04/04/24, 2:43 PM  Zephyra Bernardi E Joselinne Lawal 04/04/2024, 2:38 PM

## 2024-04-04 NOTE — H&P (Addendum)
 History and Physical    Hailey Hawkins ZOX:[id number] DOB: [birth year] DOA: 04/04/2024  PCP: Marguarite Arbour, MD (Confirm with patient/family/NH records and if not entered, this has to be entered at [facility] point of entry) Patient coming from: Home  I have personally briefly reviewed patient's old medical records in [facility]  Chief Complaint: I passed out  HPI: Hailey Hawkins is a 78 y.o. female with medical history significant of PAF on Eliquis, refractory HTN, mild aortic stenosis, breast cancer stage Ia status post chemo/immunotherapy, IDDM, HLD, hypothyroidism, asthma/COPD, presented with syncope.  Patient woke up in the middle night and went to bathroom and passed out.  Found out by husband who heard a loud thump and went to see what happened and found patient on the floor.  Estimated episode less than 1 minute.  Patient recovered consciousness and no postictal confusion denied any tongue biting loss of urine or bowel movement.  She does not remember that she had any prodrome such as lightheadedness palpitation shortness of breath before the episode.  She only complains about bilateral knee pain which is chronic.  She does have symptoms of occasional lightheadedness and exertional dyspnea in recent month.  She can tolerate 5 to 10 minutes walk before start to feel shortness of breath.  No chest pains no palpitation no nauseous vomiting.  She has been eating and drinking fine and no diarrhea.  ED Course: Afebrile, bradycardia heart rate 62 blood pressure 91/60.  Sinus rhythm on telemonitoring.  Blood work showed K4.1, glucose 352, BUN 24 creatinine 2.9, hemoglobin 11.9 WBC 7.5.  Chest x-ray appears to show mild pulmonary congestion but no acute infiltrates.  Review of Systems: As per HPI otherwise 14 point review of systems negative.    Past Medical History:  Diagnosis Date   Anemia    pernicious   Asthma    Back pain    Basal cell carcinoma 03/17/2023   mid nasal dorsum, Mohs  05/06/2023   Breast cancer (HCC) 2015   right c lumpectomy c radiation   DCIS (ductal carcinoma in situ) of breast    Diabetes mellitus without complication (HCC)    Elevated cholesterol    GERD (gastroesophageal reflux disease)    Hashimoto's disease    Hypertension    Hypothyroidism    MRSA infection    Obesity goither   Personal history of radiation therapy 2015   right breast DCIS   SIADH (syndrome of inappropriate ADH production) (HCC)    SOB (shortness of breath)    Tachycardia     Past Surgical History:  Procedure Laterality Date   ABDOMINAL HYSTERECTOMY     BREAST BIOPSY Right 11/2013   DCIS   BREAST LUMPECTOMY Right 2015   DCIS, clear margins   CESAREAN SECTION     x2   colononoscopy     COLONOSCOPY N/A 08/14/2016   Procedure: COLONOSCOPY;  Surgeon: Scot Jun, MD;  Location: [facility] ENDOSCOPY;  Service: Endoscopy;  Laterality: N/A;   COLONOSCOPY WITH ESOPHAGOGASTRODUODENOSCOPY (EGD)     ESOPHAGOGASTRODUODENOSCOPY (EGD) WITH PROPOFOL N/A 08/14/2016   Procedure: ESOPHAGOGASTRODUODENOSCOPY (EGD) WITH PROPOFOL;  Surgeon: Scot Jun, MD;  Location: [facility] ENDOSCOPY;  Service: Endoscopy;  Laterality: N/A;   INCISION AND DRAINAGE ABSCESS Left 10/18/2021   Procedure: INCISION AND DRAINAGE ABSCESS;  Surgeon: Carolan Shiver, MD;  Location: [facility] ORS;  Service: General;  Laterality: Left;  Right labia   MASTECTOMY     MASTECTOMY, PARTIAL Right    REPLACEMENT TOTAL  KNEE Right    REPLACEMENT TOTAL KNEE Left    THYROIDECTOMY, PARTIAL     caused throat paralysis  right side   TONSILLECTOMY       reports that she has never smoked. She has never used smokeless tobacco. She reports that she does not drink alcohol and does not use drugs.  Allergies  Allergen Reactions   Methamphetamine Shortness Of Breath    Difficulty breathing (INHALER)   Amoxicillin     Other reaction(s): Diarrhea and vomiting (finding)   Antihistamines, Chlorpheniramine-Type     Other  reaction(s): Other (See Comments) Severe dryness   Ciprofloxacin     Other reaction(s): Distress (finding)   Codeine     Other reaction(s): Diarrhea and vomiting (finding), Weal or any derivitive   Other Other (See Comments)    Birth control pills - blood clots Birth control pills - blood clots   Oxycodone-Acetaminophen Nausea And Vomiting   Penicillin G     Other reaction(s): Weal   Meloxicam Palpitations    Family History  Problem Relation Age of Onset   Cancer Daughter        colon cancer; age 17   Colon cancer Daughter    Breast cancer Neg Hx      Prior to Admission medications   Medication Sig Start Date End Date Taking? Authorizing Provider  acetaminophen (TYLENOL) 500 MG tablet Take 1,000 mg by mouth every 6 (six) hours as needed for mild pain.    [provider]  albuterol (PROVENTIL HFA;VENTOLIN HFA) 108 (90 BASE) MCG/ACT inhaler Inhale 2 puffs into the lungs every 4 (four) hours as needed for wheezing or shortness of breath.    [provider]  ALPRAZolam Prudy Feeler) 0.5 MG tablet Take 0.5 mg by mouth daily.    [provider]  apixaban (ELIQUIS) 5 MG TABS tablet Take 5 mg by mouth 2 (two) times daily.    [provider]  atorvastatin (LIPITOR) 10 MG tablet Take 10 mg by mouth daily.  11/04/16 03/11/22  [provider]  benazepril (LOTENSIN) 40 MG tablet 40 mg daily.  05/27/18   [provider]  cloNIDine (CATAPRES) 0.2 MG tablet Take 0.2 mg by mouth daily.  04/26/18 07/07/23  [provider]  docusate sodium (COLACE) 100 MG capsule Take 100 mg by mouth 2 (two) times daily.    [provider]  ferrous sulfate 325 (65 FE) MG tablet Take 325 mg by mouth 2 (two) times daily with a meal.    [provider]  glucose blood (FREESTYLE LITE) test strip Use 2 (two) times daily 07/29/19   [provider]  hydrALAZINE (APRESOLINE) 100 MG tablet 50 mg 3 (three) times daily.  12/05/16   [provider]  ibandronate (BONIVA) 150 MG tablet  10/24/18   [provider]  insulin glargine (LANTUS) 100 UNIT/ML injection Inject 20 Units into the skin at bedtime.    [provider]  Insulin Pen Needle (NOVOFINE) 30G X 8 MM MISC as directed To inject insulin. 07/29/19   [provider]  Insulin Syringe-Needle U-100 (INSULIN SYRINGE .5CC/31GX5/16") 31G X 5/16" 0.5 ML MISC Use 1 syringe daily for insulin injections 04/26/18   [provider]  isosorbide mononitrate (IMDUR) 30 MG 24 hr tablet Take by mouth. 12/14/19 07/07/23  [provider]  JARDIANCE 10 MG TABS tablet  11/24/22   [provider]  levothyroxine (SYNTHROID, LEVOTHROID) 100 MCG tablet Take 100 mcg by mouth daily before breakfast.  [provider]  magnesium oxide (MAG-OX) 400 MG tablet Take 400 mg by mouth daily.  05/08/16   [provider]  metFORMIN (GLUCOPHAGE) 1000 MG tablet Take 1,000 mg by mouth 2 (two) times daily with a meal.    [provider]  metoprolol tartrate (LOPRESSOR) 50 MG tablet Take 50 mg by mouth 2 (two) times daily.    [provider]  Multiple Vitamin (MULTIVITAMIN) tablet Take 1 tablet by mouth daily.    [provider]  omeprazole (PRILOSEC OTC) 20 MG tablet Take 20 mg by mouth daily.     [provider]  OXYGEN Inhale 2 L into the lungs at bedtime.    [provider]  pramoxine (PROCTOFOAM) 1 % foam Place 1 application rectally 3 (three) times daily as needed for hemorrhoids. 09/14/18   Sharman Cheek, MD  propranolol (INDERAL) 80 MG tablet Take by mouth. 03/24/23 03/23/24  [provider]  sucralfate (CARAFATE) 1 G tablet Take 1 g by mouth 4 (four) times daily.     [provider]    Physical Exam: Vitals:   04/04/24 0541 04/04/24 0544 04/04/24 0546 04/04/24 0547  BP:   91/62   Pulse: 62     Resp: 18     Temp: 98 F (36.7 C)     TempSrc: Oral     SpO2:    95%   Weight:  83.9 kg      Constitutional: NAD, calm, comfortable Vitals:   04/04/24 0541 04/04/24 0544 04/04/24 0546 04/04/24 0547  BP:   91/62   Pulse: 62     Resp: 18     Temp: 98 F (36.7 C)     TempSrc: Oral     SpO2:    95%  Weight:  83.9 kg     Eyes: PERRL, lids and conjunctivae normal ENMT: Mucous membranes are moist. Posterior pharynx clear of any exudate or lesions.Normal dentition.  Neck: normal, supple, no masses, no thyromegaly Respiratory: clear to auscultation bilaterally, no wheezing, no crackles. Normal respiratory effort. No accessory muscle use.  Cardiovascular: Regular rate and rhythm, systolic murmur on heart base, diminished S2. No extremity edema. 2+ pedal pulses. No carotid bruits.  Abdomen: no tenderness, no masses palpated. No hepatosplenomegaly. Bowel sounds positive.  Musculoskeletal: no clubbing / cyanosis. No joint deformity upper and lower extremities. Good ROM, no contractures. Normal muscle tone.  Skin: no rashes, lesions, ulcers. No induration Neurologic: CN 2-12 grossly intact. Sensation intact, DTR normal. Strength 5/5 in all 4.  Psychiatric: Normal judgment and insight. Alert and oriented x 3. Normal mood.     Labs on Admission: I have personally reviewed following labs and imaging studies  CBC: Recent Labs  Lab 04/04/24 0635  WBC 7.5  HGB 11.9*  HCT 35.4*  MCV 87.4  PLT 199   Basic Metabolic Panel: Recent Labs  Lab 04/04/24 0635  NA 135  K 4.1  CL 101  CO2 24  GLUCOSE 352*  BUN 24*  CREATININE 0.98  CALCIUM 9.0   GFR: CrCl cannot be calculated (Unknown ideal weight.). Liver Function Tests: No results for input(s): "AST", "ALT", "ALKPHOS", "BILITOT", "PROT", "ALBUMIN" in the last 168 hours. No results for input(s): "LIPASE", "AMYLASE" in the last 168 hours. No results for input(s): "AMMONIA" in the last 168 hours. Coagulation Profile: No results for input(s): "INR", "PROTIME" in the last 168 hours. Cardiac Enzymes: No  results for input(s): "CKTOTAL", "CKMB", "CKMBINDEX", "TROPONINI" in the last 168 hours. BNP (last 3  results) No results for input(s): "PROBNP" in the last 8760 hours. HbA1C: No results for input(s): "HGBA1C" in the last 72 hours. CBG: No results for input(s): "GLUCAP" in the last 168 hours. Lipid Profile: No results for input(s): "CHOL", "HDL", "LDLCALC", "TRIG", "CHOLHDL", "LDLDIRECT" in the last 72 hours. Thyroid Function Tests: No results for input(s): "TSH", "T4TOTAL", "FREET4", "T3FREE", "THYROIDAB" in the last 72 hours. Anemia Panel: No results for input(s): "VITAMINB12", "FOLATE", "FERRITIN", "TIBC", "IRON", "RETICCTPCT" in the last 72 hours. Urine analysis:    Component Value Date/Time   COLORURINE Straw 04/20/2013 1513   APPEARANCEUR Clear 04/20/2013 1513   LABSPEC 1.027 04/20/2013 1513   PHURINE 5.0 04/20/2013 1513   GLUCOSEU >=500 04/20/2013 1513   HGBUR Negative 04/20/2013 1513   BILIRUBINUR Negative 04/20/2013 1513   KETONESUR Negative 04/20/2013 1513   PROTEINUR Negative 04/20/2013 1513   NITRITE Negative 04/20/2013 1513   LEUKOCYTESUR 1+ 04/20/2013 1513    Radiological Exams on Admission: CT Lumbar Spine Wo Contrast Result Date: 04/04/2024 CLINICAL DATA:  78 year old female status post fall.  Pain. EXAM: CT LUMBAR SPINE WITHOUT CONTRAST TECHNIQUE: Multidetector CT imaging of the lumbar spine was performed without intravenous contrast administration. Multiplanar CT image reconstructions were also generated. RADIATION DOSE REDUCTION: This exam was performed according to the departmental dose-optimization program which includes automated exposure control, adjustment of the mA and/or kV according to patient size and/or use of iterative reconstruction technique. COMPARISON:  Thoracic CT today reported separately. Chest CT 05/04/2020. FINDINGS: Segmentation: Normal, concordant with the thoracic numbering today. Alignment: Mild levoconvex lumbar scoliosis. Maintained lumbar  lordosis. Mild degenerative retrolisthesis L1-L2 and L2-L3. Vertebrae: Osteopenia. L1 vertebral height stable since 2021. Maintained other lumbar vertebral body height. Benign hemangioma of the L3 vertebral body (no follow-up imaging recommended). Lumbar vertebrae appear intact. Visible sacrum and SI joints appear intact. Paraspinal and other soft tissues: Negative costophrenic angles. Aortoiliac calcified atherosclerosis. Normal caliber abdominal aorta. Large bowel retained stool. Lumbar paraspinal soft tissues are within normal limits. Disc levels: Chronic vacuum disc at L1-L2. Mild degenerative lumbar spinal stenosis suspected there. And mild to moderate at L2-L3 related to combined disc and posterior element degeneration (sagittal image 32). Mild L3-L4 degeneration also suspected with bulky calcified ligament flavum hypertrophy contributing there. Otherwise L4-L5 and L5-S1 facet arthropathy. IMPRESSION: 1. Osteopenia. No acute traumatic injury identified in the Lumbar Spine. 2. 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 Electronically Signed   By: Odessa Fleming M.D.   On: 04/04/2024 08:40   CT Thoracic Spine Wo Contrast Result Date: 04/04/2024 CLINICAL DATA:  78 year old female status post fall.  Pain. EXAM: CT THORACIC SPINE WITHOUT CONTRAST TECHNIQUE: Multidetector CT images of the thoracic were obtained using the standard protocol without intravenous contrast. RADIATION DOSE REDUCTION: This exam was performed according to the departmental dose-optimization program which includes automated exposure control, adjustment of the mA and/or kV according to patient size and/or use of iterative reconstruction technique. COMPARISON:  Chest CT 05/04/2020. FINDINGS: Limited cervical spine imaging: Cervicothoracic junction alignment is within normal limits. Chronic disc and endplate degeneration at that level. Thoracic spine segmentation:  Normal. Alignment: Stable thoracic kyphosis  since 2021. Mild thoracic scoliosis. No spondylolisthesis. Vertebrae: Chronic osteopenia. Maintained thoracic vertebral height. Chronic interbody ankylosis appears degenerative at T6-T7. No acute thoracic vertebral fracture identified. Visible posterior ribs appear intact. Paraspinal and other soft tissues: Visible central airways, lungs demonstrate stable patency and ventilation. Calcified coronary artery atherosclerosis or stent. Calcified aortic atherosclerosis. No pericardial or pleural effusion is  evident. Negative visible noncontrast upper abdominal viscera. Thoracic paraspinal soft tissues are within normal limits. Disc levels: Age-appropriate thoracic spine degeneration does not appear significantly changed from the 2021 CT. Lumbar spine reported separately. IMPRESSION: 1. No acute traumatic injury identified in the Thoracic Spine. 2. Stable CT appearance of the thoracic spine since 2021. 3.  Aortic Atherosclerosis (ICD10-I70.0). Electronically Signed   By: Odessa Fleming M.D.   On: 04/04/2024 08:28   DG Chest Portable 1 View Result Date: 04/04/2024 CLINICAL DATA:  Mechanical fall. EXAM: PORTABLE CHEST 1 VIEW COMPARISON:  04/01/2023. FINDINGS: Bilateral lung fields are clear. Bilateral costophrenic angles are clear. Note is made of elevated right hemidiaphragm. Normal cardio-mediastinal silhouette. No acute osseous abnormalities. The soft tissues are within normal limits. IMPRESSION: No active disease. Electronically Signed   By: Jules Schick M.D.   On: 04/04/2024 08:23   DG Knee 1-2 Views Right Result Date: 04/04/2024 CLINICAL DATA:  Mechanical fall.  Bilateral knee pain. EXAM: RIGHT KNEE - 1-2 VIEW; LEFT KNEE - 1-2 VIEW COMPARISON:  09/20/2012 and 04/21/2011. FINDINGS: No acute fracture or dislocation. No aggressive osseous lesion. Redemonstration of bilateral total knee arthroplasty with patellar resurfacing. The hardware is intact. No periprosthetic fracture or lucency. No interval change in alignment,  when compared to the available prior examination. No knee effusion or focal soft tissue swelling. No radiopaque foreign bodies. IMPRESSION: *No acute osseous abnormality of the bilateral knees. Bilateral total knee arthroplasty without evidence of hardware failure. Electronically Signed   By: Jules Schick M.D.   On: 04/04/2024 08:22   DG Knee 1-2 Views Left Result Date: 04/04/2024 CLINICAL DATA:  Mechanical fall.  Bilateral knee pain. EXAM: RIGHT KNEE - 1-2 VIEW; LEFT KNEE - 1-2 VIEW COMPARISON:  09/20/2012 and 04/21/2011. FINDINGS: No acute fracture or dislocation. No aggressive osseous lesion. Redemonstration of bilateral total knee arthroplasty with patellar resurfacing. The hardware is intact. No periprosthetic fracture or lucency. No interval change in alignment, when compared to the available prior examination. No knee effusion or focal soft tissue swelling. No radiopaque foreign bodies. IMPRESSION: *No acute osseous abnormality of the bilateral knees. Bilateral total knee arthroplasty without evidence of hardware failure. Electronically Signed   By: Jules Schick M.D.   On: 04/04/2024 08:22   DG Pelvis 1-2 Views Result Date: 04/04/2024 CLINICAL DATA:  Mechanical fall. EXAM: PELVIS - 1-2 VIEW COMPARISON:  None Available. FINDINGS: Pelvis is intact with normal and symmetric sacroiliac joints. No acute fracture or dislocation. No aggressive osseous lesion. Visualized sacral arcuate lines are unremarkable. There are changes of chronic pubic symphisitis. There are moderate to severe degenerative changes of bilateral hip joints (right > left), in the form of reduced joint space, subchondral sclerosis / cystic changes and osteophytosis. No radiopaque foreign bodies. IMPRESSION: *No acute osseous abnormality of the pelvis. Electronically Signed   By: Jules Schick M.D.   On: 04/04/2024 08:19   CT Head Wo Contrast Result Date: 04/04/2024 CLINICAL DATA:  Mental status change.  Syncope.  No trauma. EXAM: CT  HEAD WITHOUT CONTRAST TECHNIQUE: Contiguous axial images were obtained from the base of the skull through the vertex without intravenous contrast. RADIATION DOSE REDUCTION: This exam was performed according to the departmental dose-optimization program which includes automated exposure control, adjustment of the mA and/or kV according to patient size and/or use of iterative reconstruction technique. COMPARISON:  Brain MRI from 05/15/2014 FINDINGS: Brain: No evidence of acute infarction, hemorrhage, hydrocephalus, extra-axial collection or mass lesion/mass effect. There is mild diffuse low-attenuation within  the subcortical and periventricular white matter compatible with chronic microvascular disease. Vascular: No hyperdense vessel or unexpected calcification. Skull: Normal. Negative for fracture or focal lesion. Sinuses/Orbits: There is mild mucosal thickening involving the left maxillary sinus and sphenoid sinus. Other: None. IMPRESSION: 1. No acute intracranial abnormalities. 2. Chronic microvascular disease. 3. Mild paranasal sinus disease. Electronically Signed   By: Signa Kell M.D.   On: 04/04/2024 07:30    EKG: Independently reviewed.  Sinus rhythm, no acute ST changes.  PR interval 156, prolonged QTc 514.  Assessment/Plan Principal Problem:   Syncope  (please populate well all problems here in Problem List. (For example, if patient is on BP meds at home and you resume or decide to hold them, it is a problem that needs to be her. Same for CAD, COPD, HLD and so on)  Syncope and fall -Suspect cardiac etiology. -Given history of AS, and other symptoms of recent decompensation such as exertional dyspnea and lightheadedness, suspect progress of aortic stenosis, will check echo -Other DDx, blood pressure on the lower side, we will hold off some of her BP meds today and check orthostatic vital signs. -Telemonitoring x 24 hours to rule out any ventricular arrhythmia. -K= 4.1, will check magnesium  and phosphorus level. - PT evaluation, bilateral knee x-ray negative for fracture  PAF - No acute concern in sinus rhythm, continue telemonitoring x 24 hours - Continue Eliquis - Resume home metoprolol 50 g daily  HTN - As above, will hold off home BP meds including clonidine and ARB today, continue hydralazine and Imdur regimen, continue propranolol -Start as needed hydralazine   Prolonged Qtc - Mild, repeat EKG tomorrow  DVT prophylaxis: Eliquis Code Status: Full code Family Communication: None at bedside Disposition Plan: Expect less than 2 midnight hospital stay Consults called: None Admission status: Telemetry observation   Emeline General MD [facility] Pager [phone]  04/04/2024, 11:02 AM

## 2024-04-05 DIAGNOSIS — R55 Syncope and collapse: Secondary | ICD-10-CM | POA: Diagnosis not present

## 2024-04-05 LAB — ECHOCARDIOGRAM COMPLETE
S' Lateral: 2.7 cm
Weight: 2959.46 [oz_av]

## 2024-04-05 LAB — CBG MONITORING, ED
Glucose-Capillary: 168 mg/dL — ABNORMAL HIGH (ref 70–99)
Glucose-Capillary: 254 mg/dL — ABNORMAL HIGH (ref 70–99)
Glucose-Capillary: 290 mg/dL — ABNORMAL HIGH (ref 70–99)
Glucose-Capillary: 91 mg/dL (ref 70–99)

## 2024-04-05 LAB — GLUCOSE, CAPILLARY: Glucose-Capillary: 108 mg/dL — ABNORMAL HIGH (ref 70–99)

## 2024-04-05 MED ORDER — SODIUM CHLORIDE 0.9 % IV SOLN: INTRAVENOUS | Status: AC

## 2024-04-05 MED ORDER — PROPRANOLOL HCL 20 MG PO TABS
20.0000 mg | ORAL_TABLET | Freq: Two times a day (BID) | ORAL | Status: DC
  Administered 2024-04-05 – 2024-04-06 (×2): 20 mg via ORAL
  Filled 2024-04-05 (×2): qty 1

## 2024-04-05 MED ORDER — HALOPERIDOL LACTATE 5 MG/ML IJ SOLN: 1.0000 mg | Freq: Once | INTRAMUSCULAR | Status: DC

## 2024-04-05 MED ORDER — SODIUM CHLORIDE 0.9 % IV SOLN
1.0000 g | INTRAVENOUS | Status: AC
  Administered 2024-04-05 – 2024-04-07 (×3): 1 g via INTRAVENOUS
  Filled 2024-04-05 (×3): qty 10

## 2024-04-05 NOTE — ED Notes (Signed)
 This RN gave report to Andre Lefort RN and performed care handoff. Call light in reach, bed wheels locked, side rail raised, pt placed on fall precautions. Rounding completed.

## 2024-04-05 NOTE — ED Notes (Signed)
 Writer assisted pt to bathroom at this time via wheelchair. Pt able to stand and pivot onto toilet without assist from RN. Writer remained in bathroom with pt to ensure safety.

## 2024-04-05 NOTE — ED Notes (Signed)
 Writer completed rounding on pt and found pt to have climbed over side rails and sitting on commode. Pt had removed peripheral IV and it was found to be intact on pt's bed. Pt also removed all medical equipment. Pt's bed alarm replaced and RN educated pt she is not to get out of bed without assistance. Verbal acknowledgement from pt and understanding. Pt placed back on medical equipment. Sheets and gown changed and hygiene completed. Pt remains in a room in front of nurse's station. Call light in reach of pt with return demonstration on how to use it. Fall risk precautions maintained.

## 2024-04-05 NOTE — TOC Initial Note (Signed)
 Transition of Care [facility]) - Initial/Assessment Note    Patient Details  Name: Hailey Hawkins MRN: [id number] Date of Birth: 05/15/1946  Transition of Care [facility]) CM/SW Contact:    Colin Broach, LCSW Phone Number: 04/05/2024, 5:56 PM  Clinical Narrative:                 CSW met with patient at bedside to complete initial assessment.  CSW introduced self and reason for visit.  Patient amenable to answering questions.  Patient states that her PCP is Aram Beecham and she uses [facility] in [place].  Pt has no concerns about paying for her meds.  She lives in the home with her husband, Hailey Hawkins [phone]).  She states that she doesn't have any DME and her husband will transport her home at discharge.    CSW spoke with her about PT recommendation for [facility] and DME.  Patient given [facility] choice list.  She would like to get the RW and possibly the WC (depending on how her BP does per PT note.  TOC to follow up with choice.  Patient states that she doesn't have her glasses with her so she may not be able to see the list well.  She states that she had PT in the home some time ago, but she doesn't remember the agency's name.    TOC to order any DME needed for discharge.    Expected Discharge Plan: Home w Home Health Services Barriers to Discharge: Continued Medical Work up   Patient Goals and CMS Choice   CMS Medicare.gov Compare Post Acute Care list provided to:: Patient Choice offered to / list presented to : Patient      Expected Discharge Plan and Services     Post Acute Care Choice: Home Health Living arrangements for the past 2 months: Single Family Home                           HH Arranged: PT, OT          Prior Living Arrangements/Services Living arrangements for the past 2 months: Single Family Home Lives with:: Spouse Patient language and need for interpreter reviewed:: Yes Do you feel safe going back to the place where you live?: Yes      Need for Family  Participation in Patient Care: Yes (Comment) Care giver support system in place?: Yes (comment)   Criminal Activity/Legal Involvement Pertinent to Current Situation/Hospitalization: No - Comment as needed  Activities of Daily Living      Permission Sought/Granted                  Emotional Assessment Appearance:: Appears stated age Attitude/Demeanor/Rapport: Gracious Affect (typically observed): Appropriate Orientation: : Oriented to Self, Oriented to Place, Oriented to  Time, Oriented to Situation Alcohol / Substance Use: Not Applicable Psych Involvement: No (comment)  Admission diagnosis:  Syncope [R55] Patient Active Problem List   Diagnosis Date Noted   Syncope 04/04/2024   B12 deficiency 07/07/2023   Weight loss 03/03/2021   Elevated LFTs 03/03/2021   Nodule of upper lobe of right lung 05/06/2019   Nose dryness 03/21/2019   Abnormal CT of the chest 12/07/2018   Chronic fatigue 12/07/2018   Exertional dyspnea 12/07/2018   PR3 ANCA antibodies present 12/07/2018   Coronary artery disease involving native coronary artery of native heart 11/16/2018   Type II diabetes mellitus, uncontrolled 02/18/2018   Iron deficiency anemia 01/14/2018   Hx  of adenomatous colonic polyps 03/26/2016   Mild aortic valve stenosis 02/07/2016   Morbid obesity with BMI of 40.0-44.9, adult (HCC) 01/11/2016   SIADH (syndrome of inappropriate ADH production) (HCC) 10/17/2015   Chronic hyponatremia 07/05/2015   Acquired hypothyroidism 03/06/2014   Anemia, unspecified 03/06/2014   Benign essential hypertension 03/06/2014   Nontoxic uninodular goiter 03/06/2014   Osteoarthrosis involving lower leg 03/06/2014   Pernicious anemia 03/06/2014   Pure hypercholesterolemia 03/06/2014   Type 2 diabetes mellitus without complication, with long-term current use of insulin (HCC) 03/06/2014   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   PCP:  Marguarite Arbour, MD Pharmacy:    [facility] [phone] - [place], [place] - [address] AT [facility] OF [address] [place] [address] Phone: [phone] Fax: [phone]     Social Drivers of Health (SDOH) Social History: SDOH Screenings   Food Insecurity: No Food Insecurity (08/28/2023)   Received from [facility]  Housing: Unknown (02/26/2024)   Received from [facility]  Transportation Needs: No Transportation Needs (08/28/2023)   Received from [facility]  Utilities: Not At Risk (08/28/2023)   Received from [facility]  Financial Resource Strain: Low Risk  (08/28/2023)   Received from [facility]  Tobacco Use: Low Risk  (04/04/2024)   SDOH Interventions:     Readmission Risk Interventions     No data to display

## 2024-04-05 NOTE — ED Notes (Signed)
  This RN received report from Augustin Coupe RN and performed care handoff. This RN introduced self to pt. Call light in reach, bed wheels locked, side rail raised, pt updated on plan of care. Rounding completed.

## 2024-04-05 NOTE — ED Notes (Signed)
 Pt reporting to ED d/t UTI that lead to weakness and fall. Pt states that she fell in her bedroom, on the way to use the bathroom. The pt also states that she felt weak just before passing out. Pt denies hitting her head and denies any pain, aside from bilateral knees. Pt reported hx of bilateral knee repair but has not had any issues with them since. Pt is resting comfortably in bed at this time, awaiting a clean room on the inpatient floor. Pt ABCs intact. RR even and unlabored. Pt in NAD. Bed in lowest locked position. Call bell in reach. Pt family at the bedside.   Past Medical History:  Diagnosis Date   Anemia    pernicious   Asthma    Back pain    Basal cell carcinoma 03/17/2023   mid nasal dorsum, Mohs 05/06/2023   Breast cancer (HCC) 2015   right c lumpectomy c radiation   DCIS (ductal carcinoma in situ) of breast    Diabetes mellitus without complication (HCC)    Elevated cholesterol    GERD (gastroesophageal reflux disease)    Hashimoto's disease    Hypertension    Hypothyroidism    MRSA infection    Obesity goither   Personal history of radiation therapy 2015   right breast DCIS   SIADH (syndrome of inappropriate ADH production) (HCC)    SOB (shortness of breath)    Tachycardia

## 2024-04-05 NOTE — Progress Notes (Signed)
 Progress Note   Patient: Hailey Hawkins UEA:[id number] DOB: 1946-11-02 DOA: 04/04/2024     0 DOS: the patient was seen and examined on 04/05/2024   Brief hospital course:  Hailey Hawkins is a 78 y.o. female with medical history significant of PAF on Eliquis, refractory HTN, mild aortic stenosis, breast cancer stage Ia status post chemo/immunotherapy, IDDM, HLD, hypothyroidism, asthma/COPD, presented with syncope.   Patient woke up in the middle night and went to bathroom and passed out.  Found out by husband who heard a loud thump and went to see what happened and found patient on the floor.  Estimated episode less than 1 minute.  Patient recovered consciousness and no postictal confusion denied any tongue biting loss of urine or bowel movement.  She does not remember that she had any prodrome such as lightheadedness palpitation shortness of breath before the episode.  She only complains about bilateral knee pain which is chronic.  She does have symptoms of occasional lightheadedness and exertional dyspnea in recent month.  She can tolerate 5 to 10 minutes walk before start to feel shortness of breath.  No chest pains no palpitation no nauseous vomiting.  She has been eating and drinking fine and no diarrhea.   ED Course: Afebrile, bradycardia heart rate 62 blood pressure 91/60.  Sinus rhythm on telemonitoring.  Blood work showed K4.1, glucose 352, BUN 24 creatinine 2.9, hemoglobin 11.9 WBC 7.5.  Chest x-ray appears to show mild pulmonary congestion but no acute infiltrates.    Assessment and Plan:   Syncope and collapse Appears to be vasovagal and may also be related to patient's known history of aortic stenosis Patient continues to feel dizzy and has orthostatic blood pressure changes, Supine BP 140/77 (MAP 94) with HR 83 bpm; sitting BP 134/93 (MAP 106) with HR 94 bpm; standing BP (at 0 minutes) 102/84 (MAP 91) with HR 82 bpm.  Given history of AS, and other symptoms of recent decompensation  such as exertional dyspnea and lightheadedness, suspect progression of aortic stenosis.  2D echocardiogram shows an LVEF of 60 to 65% with normal LV systolic function.  Normal aortic valve, aortic valve sclerosis/calcification is present without evidence of stenosis Continue to hold antihypertensive medications    PAF No acute concern in sinus rhythm, continue telemonitoring x 24 hours Continue Eliquis Continue propranolol for rate control   HTN Patient on several antihypertensive medications including clonidine, benazepril, hydralazine and Imdur all of which will remain on hold She will need adjustments of her blood pressure medications prior to discharge    Prolonged Qtc Mild Follow-up repeat EKG   Pyuria Concerning for UTI Urine culture yielded E. Coli Will treat patient empirically with Rocephin while awaiting results of urine culture     Subjective: Continues to complain of dizziness.  Physical Exam: Vitals:   04/05/24 0730 04/05/24 0745 04/05/24 0830 04/05/24 1040  BP: (!) 161/111  (!) 143/91 (!) 147/72  Pulse: 91 89 77 90  Resp: 19 19 17    Temp: 98.1 F (36.7 C)     TempSrc: Oral     SpO2: 98% 98% 96%   Weight:       Eyes: PERRL, lids and conjunctivae normal ENMT: Mucous membranes are moist. Posterior pharynx clear of any exudate or lesions.Normal dentition.  Neck: normal, supple, no masses, no thyromegaly Respiratory: clear to auscultation bilaterally, no wheezing, no crackles. Normal respiratory effort. No accessory muscle use.  Cardiovascular: Regular rate and rhythm, No extremity edema. 2+ pedal pulses. No carotid  bruits.  Abdomen: no tenderness, no masses palpated. No hepatosplenomegaly. Bowel sounds positive.  Musculoskeletal: no clubbing / cyanosis. No joint deformity upper and lower extremities. Good ROM, no contractures. Normal muscle tone.  Skin: no rashes, lesions, ulcers. No induration Neurologic: CN 2-12 grossly intact. Sensation intact, DTR normal.  Strength 5/5 in all 4.  Psychiatric: Normal judgment and insight. Alert and oriented x 3. Normal mood.       Data Reviewed: Labs reviewed.  Hemoglobin A1c 7.4, pyuria There are no new results to review at this time.  Family Communication: Plan of care discussed with patient in detail.  She verbalizes understanding and agrees with the plan  Disposition: Status is: Observation The patient remains OBS appropriate and will d/c before 2 midnights.  Planned Discharge Destination: Home with Home Health    Time spent: 40  minutes  Author: Lucile Shutters, MD 04/05/2024 1:59 PM  For on call review [email or web address].

## 2024-04-05 NOTE — ED Notes (Signed)
 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 Family members at the bedside appears to have a better understanding of patients condition and hospital plan.

## 2024-04-05 NOTE — ED Notes (Addendum)
 ORTHOSTATIC VITAL SIGNS completed by Physical Therapy via Emily H Supine BP 140/77 (MAP 94) with HR 83 bpm; sitting BP 134/93 (MAP 106) with HR 94 bpm; standing BP (at 0 minutes) 102/84 (MAP 91) with HR 82 bpm. Pt felt like she was going to pass out in standing so [place] with PT placed ptback to bed. Once laying down, pt's BP improved to 144/86 (MAP 103) with HR 80 bpm; symptoms resolved.   Dr. Joylene Igo MD aware.

## 2024-04-05 NOTE — Care Management Obs Status (Signed)
 MEDICARE OBSERVATION STATUS NOTIFICATION   Patient Details  Name: Hailey Hawkins MRN: [id number] Date of Birth: Apr 19, 1946   Medicare Observation Status Notification Given:  Orland Dec, CMA 04/05/2024, 9:52 AM

## 2024-04-05 NOTE — Progress Notes (Signed)
 Physical Therapy Treatment Patient Details Name: Hailey Hawkins MRN: [id number] DOB: 28-Jan-1946 Today's Date: 04/05/2024   History of Present Illness Pt is a 78 y.o. female presenting to hospital 04/04/24 with c/o syncope.  Pt admitted with syncope and fall, PAF.  PMH includes PAF on Eliquis, refractory htn, mild aortic stenosis, R breast CA stage Ia s/p chemo/immunotherapy/lumpectomy, IIDDM, HLD, asthma/COPD, B TKR.    PT Comments  Pt resting in bed upon PT arrival; pt's husband present; pt agreeable to therapy.  Took pt's orthostatics: Supine BP 140/77 (MAP 94) with HR 83 bpm; sitting BP 134/93 (MAP 106) with HR 94 bpm; standing BP (at 0 minutes) 102/84 (MAP 91) with HR 82 bpm. Pt felt like she was going to pass out in standing so therapist assisted pt back to bed (unable to get BP reading at 3 minutes standing d/t this). Once laying down, pt's BP improved to 144/86 (MAP 103) with HR 80 bpm; symptoms resolved.  MD and pt's nurse updated regarding pt's vitals and symptoms.  Will continue to progress pt's functional mobility per pt tolerance.   If plan is discharge home, recommend the following: A little help with walking and/or transfers;A little help with bathing/dressing/bathroom;Assistance with cooking/housework;Assist for transportation;Help with stairs or ramp for entrance   Can travel by private vehicle        Equipment Recommendations  Rolling walker (2 wheels);Other (comment);Wheelchair (measurements PT);Wheelchair cushion (measurements PT) (pt reports having RW at home already; may need manual w/c if BP does not improve)    Recommendations for Other Services       Precautions / Restrictions Precautions Precautions: Fall Precaution/Restrictions Comments: Monitor BP Restrictions Weight Bearing Restrictions Per Provider Order: No     Mobility  Bed Mobility Overal bed mobility: Needs Assistance Bed Mobility: Supine to Sit, Sit to Supine     Supine to sit: Supervision, HOB  elevated, Used rails Sit to supine: Supervision, HOB elevated   General bed mobility comments: mild increased effort to perform on own    Transfers Overall transfer level: Needs assistance Equipment used: Rolling walker (2 wheels) Transfers: Sit to/from Stand Sit to Stand: Contact guard assist           General transfer comment: vc's for UE placement during transfer    Ambulation/Gait               General Gait Details: Deferred d/t pt reporting feeling like she was going to pass out in standing   Stairs             Wheelchair Mobility     Tilt Bed    Modified Rankin (Stroke Patients Only)       Balance Overall balance assessment: Needs assistance Sitting-balance support: No upper extremity supported, Feet supported Sitting balance-Leahy Scale: Good Sitting balance - Comments: steady reaching within BOS   Standing balance support: Bilateral upper extremity supported, Reliant on assistive device for balance Standing balance-Leahy Scale: Fair Standing balance comment: steady static standing with B UE support on RW                            Communication Communication Communication: No apparent difficulties  Cognition Arousal: Alert Behavior During Therapy: WFL for tasks assessed/performed   PT - Cognitive impairments: No apparent impairments                       PT - Cognition Comments: A&Ox4 (although pt  reporting still not feeling like she was thinking clearly) Following commands: Impaired Following commands impaired: Follows one step commands with increased time    Cueing Cueing Techniques: Verbal cues  Exercises      General Comments  Nursing cleared pt for participation in physical therapy.  Pt agreeable to PT session.      Pertinent Vitals/Pain Pain Assessment Pain Assessment: Faces Faces Pain Scale: Hurts little more Pain Location: low back and B knees with activity Pain Descriptors / Indicators: Aching,  Discomfort Pain Intervention(s): Limited activity within patient's tolerance, Monitored during session, Repositioned    Home Living                          Prior Function            PT Goals (current goals can now be found in the care plan section) Acute Rehab PT Goals Patient Stated Goal: to improve dizziness PT Goal Formulation: With patient Time For Goal Achievement: 04/18/24 Potential to Achieve Goals: Good Progress towards PT goals: Progressing toward goals    Frequency    Min 3X/week      PT Plan      Co-evaluation              AM-PAC PT "6 Clicks" Mobility   Outcome Measure  Help needed turning from your back to your side while in a flat bed without using bedrails?: None Help needed moving from lying on your back to sitting on the side of a flat bed without using bedrails?: A Little Help needed moving to and from a bed to a chair (including a wheelchair)?: A Little Help needed standing up from a chair using your arms (e.g., wheelchair or bedside chair)?: A Little Help needed to walk in hospital room?: A Lot Help needed climbing 3-5 steps with a railing? : A Lot 6 Click Score: 17    End of Session   Activity Tolerance: Other (comment) (limited d/t pt feeling like she was going to pass out in standing) Patient left: in bed;with call bell/phone within reach;with bed alarm set;with family/visitor present Nurse Communication: Mobility status;Precautions;Other (comment) (Pt requesting to go to bathroom (didn't want to use BSC in room); discussed with nurse use of w/c to/from bathroom with 1 assist d/t BP/symptom concerns (is what nursing has been doing)) PT Visit Diagnosis: Unsteadiness on feet (R26.81);Other abnormalities of gait and mobility (R26.89);Muscle weakness (generalized) (M62.81);History of falling (Z91.81);Pain Pain - Right/Left:  (B) Pain - part of body: Knee     Time: 1610-9604 PT Time Calculation (min) (ACUTE ONLY): 28  min  Charges:    $Therapeutic Activity: 23-37 mins PT General Charges $$ ACUTE PT VISIT: 1 Visit                     Hendricks Limes, PT 04/05/24, 2:23 PM
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
## 2024-04-05 NOTE — ED Notes (Signed)
 Pt being transported to inpatient room 245 at this time with Primary RN AJ.

## 2024-04-06 DIAGNOSIS — R55 Syncope and collapse: Secondary | ICD-10-CM | POA: Diagnosis not present

## 2024-04-06 LAB — GLUCOSE, CAPILLARY
Glucose-Capillary: 188 mg/dL — ABNORMAL HIGH (ref 70–99)
Glucose-Capillary: 254 mg/dL — ABNORMAL HIGH (ref 70–99)
Glucose-Capillary: 159 mg/dL — ABNORMAL HIGH (ref 70–99)
Glucose-Capillary: 311 mg/dL — ABNORMAL HIGH (ref 70–99)

## 2024-04-06 LAB — BASIC METABOLIC PANEL WITH GFR
Anion gap: 10 (ref 5–15)
BUN: 15 mg/dL (ref 8–23)
CO2: 24 mmol/L (ref 22–32)
Calcium: 9.1 mg/dL (ref 8.9–10.3)
Chloride: 103 mmol/L (ref 98–111)
Creatinine, Ser: 0.58 mg/dL (ref 0.44–1.00)
GFR, Estimated: >60 mL/min (ref >60)
Glucose, Bld: 169 mg/dL — ABNORMAL HIGH (ref 70–99)
Potassium: 3.5 mmol/L (ref 3.5–5.1)
Sodium: 137 mmol/L (ref 135–145)

## 2024-04-06 MED ORDER — CLONIDINE HCL 0.1 MG PO TABS
0.1000 mg | ORAL_TABLET | Freq: Two times a day (BID) | ORAL | Status: DC
  Administered 2024-04-06 – 2024-04-08 (×5): 0.1 mg via ORAL
  Filled 2024-04-06 (×5): qty 1

## 2024-04-06 MED ORDER — BENAZEPRIL HCL 20 MG PO TABS
40.0000 mg | ORAL_TABLET | Freq: Every day | ORAL | Status: DC
  Administered 2024-04-06 – 2024-04-07 (×2): 40 mg via ORAL
  Filled 2024-04-06 (×2): qty 2

## 2024-04-06 MED ORDER — METOPROLOL SUCCINATE ER 25 MG PO TB24
25.0000 mg | ORAL_TABLET | Freq: Every day | ORAL | Status: DC
  Administered 2024-04-06 – 2024-04-08 (×3): 25 mg via ORAL
  Filled 2024-04-06 (×3): qty 1

## 2024-04-06 MED ORDER — ISOSORBIDE MONONITRATE ER 30 MG PO TB24
30.0000 mg | ORAL_TABLET | Freq: Two times a day (BID) | ORAL | Status: DC
  Administered 2024-04-06 – 2024-04-08 (×5): 30 mg via ORAL
  Filled 2024-04-06 (×5): qty 1

## 2024-04-06 NOTE — Inpatient Diabetes Management (Signed)
 Inpatient Diabetes Program Recommendations  AACE/ADA: New Consensus Statement on Inpatient Glycemic Control  Target Ranges:  Prepandial:   less than 140 mg/dL      Peak postprandial:   less than 180 mg/dL (1-2 hours)      Critically ill patients:  140 - 180 mg/dL    Latest Reference Range & Units 04/05/24 07:42 04/05/24 14:05 04/05/24 16:57 04/05/24 23:01 04/05/24 23:35 04/06/24 08:28  Glucose-Capillary 70 - 99 mg/dL 045 (H) 409 (H) 811 (H) 91 108 (H) 188 (H)   Review of Glycemic Control  Diabetes history: DM2 Outpatient Diabetes medications: Lantus 20 units at bedtime, Jardiance 10 mg daily, Metformin 1000 mg BID Current orders for Inpatient glycemic control: Semglee 15 units Q24H, Novolog 0-20 units TID with meals, Novolog 0-5 units QHS  Inpatient Diabetes Program Recommendations:    Insulin: In reviewing chart, noted Semglee was NOT GIVEN last night (charted as medication is not available).  Thanks, Orlando Penner, RN, MSN, CDCES Diabetes Coordinator Inpatient Diabetes Program [phone] (Team Pager from 8am to 5pm)

## 2024-04-06 NOTE — Plan of Care (Signed)
  Problem: Education: Goal: Knowledge of condition and prescribed therapy will improve Outcome: Progressing   Problem: Cardiac: Goal: Will achieve and/or maintain adequate cardiac output Outcome: Progressing   Problem: Physical Regulation: Goal: Complications related to the disease process, condition or treatment will be avoided or minimized Outcome: Progressing   Problem: Coping: Goal: Ability to adjust to condition or change in health will improve Outcome: Progressing   Problem: Fluid Volume: Goal: Ability to maintain a balanced intake and output will improve Outcome: Progressing   Problem: Health Behavior/Discharge Planning: Goal: Ability to identify and utilize available resources and services will improve Outcome: Progressing   Problem: Health Behavior/Discharge Planning: Goal: Ability to manage health-related needs will improve Outcome: Progressing

## 2024-04-06 NOTE — Progress Notes (Signed)
 Physical Therapy Treatment Patient Details Name: Houda Brau MRN: [id number] DOB: [birth year] Today's Date: 04/06/2024   History of Present Illness Pt is a 78 y.o. female presenting to hospital 04/04/24 with c/o syncope.  Pt admitted with syncope and fall, PAF.  PMH includes PAF on Eliquis, refractory htn, mild aortic stenosis, R breast CA stage Ia s/p chemo/immunotherapy/lumpectomy, IIDDM, HLD, asthma/COPD, B TKR.    PT Comments  The patient is cooperative throughout session today. No dizziness reported initially with standing or walking. She walked a short distance with rolling walker with no loss of balance. Gait distance is self limited due to reported dizziness with increased activity. Encouraged patient to continue using rolling walker for safety with mobility. PT will continue to follow to maximize independence and decrease caregiver burden.    If plan is discharge home, recommend the following: A little help with walking and/or transfers;A little help with bathing/dressing/bathroom;Assistance with cooking/housework;Assist for transportation;Help with stairs or ramp for entrance   Can travel by private vehicle        Equipment Recommendations  Rolling walker (2 wheels);Other (comment);Wheelchair (measurements PT);Wheelchair cushion (measurements PT)    Recommendations for Other Services       Precautions / Restrictions Precautions Precautions: Fall Restrictions Weight Bearing Restrictions Per Provider Order: No     Mobility  Bed Mobility Overal bed mobility: Needs Assistance Bed Mobility: Supine to Sit, Sit to Supine     Supine to sit: Supervision Sit to supine: Supervision   General bed mobility comments: increased time, no physical assistance required    Transfers Overall transfer level: Needs assistance Equipment used: Rolling walker (2 wheels) Transfers: Sit to/from Stand Sit to Stand: Contact guard assist           General transfer comment: cues for  hand placement and anterior weight shifting    Ambulation/Gait Ambulation/Gait assistance: Contact guard assist Gait Distance (Feet): 15 Feet Assistive device: Rolling walker (2 wheels) Gait Pattern/deviations: Step-through pattern Gait velocity: decreased     General Gait Details: gait distance self limited due to reported dizziness. no loss of balance with mobility using rolling walker for support. encouraged patient to continue using rolling walker for safety with walking   Stairs             Wheelchair Mobility     Tilt Bed    Modified Rankin (Stroke Patients Only)       Balance Overall balance assessment: Needs assistance Sitting-balance support: No upper extremity supported, Feet supported Sitting balance-Leahy Scale: Good     Standing balance support: Bilateral upper extremity supported, Reliant on assistive device for balance Standing balance-Leahy Scale: Fair Standing balance comment: no UE support briefly for hand washing with no loss of balance                            Communication Communication Communication: No apparent difficulties  Cognition Arousal: Alert Behavior During Therapy: WFL for tasks assessed/performed   PT - Cognitive impairments: No apparent impairments                         Following commands: Impaired Following commands impaired: Follows one step commands with increased time    Cueing Cueing Techniques: Verbal cues  Exercises      General Comments General comments (skin integrity, edema, etc.): encouraged patient to monitor for signs of dizziness for fall prevention      Pertinent Vitals/Pain Pain  Assessment Pain Assessment: No/denies pain    Home Living                          Prior Function            PT Goals (current goals can now be found in the care plan section) Acute Rehab PT Goals Patient Stated Goal: to improve dizziness PT Goal Formulation: With patient Time For  Goal Achievement: 04/18/24 Potential to Achieve Goals: Good Progress towards PT goals: Progressing toward goals    Frequency    Min 3X/week      PT Plan      Co-evaluation              AM-PAC PT "6 Clicks" Mobility   Outcome Measure  Help needed turning from your back to your side while in a flat bed without using bedrails?: None Help needed moving from lying on your back to sitting on the side of a flat bed without using bedrails?: A Little Help needed moving to and from a bed to a chair (including a wheelchair)?: A Little Help needed standing up from a chair using your arms (e.g., wheelchair or bedside chair)?: A Little Help needed to walk in hospital room?: A Little Help needed climbing 3-5 steps with a railing? : A Lot 6 Click Score: 18    End of Session Equipment Utilized During Treatment: Gait belt Activity Tolerance: Patient tolerated treatment well Patient left: in bed;with call bell/phone within reach;with family/visitor present;with nursing/sitter in room Nurse Communication: Mobility status (nurse present during session) PT Visit Diagnosis: Unsteadiness on feet (R26.81);Other abnormalities of gait and mobility (R26.89);Muscle weakness (generalized) (M62.81);History of falling (Z91.81);Pain     Time: 1208-1218 PT Time Calculation (min) (ACUTE ONLY): 10 min  Charges:    $Therapeutic Activity: 8-22 mins PT General Charges $$ ACUTE PT VISIT: 1 Visit                     Donna Bernard, PT, MPT    Ina Homes 04/06/2024, 1:31 PM

## 2024-04-06 NOTE — Plan of Care (Signed)

## 2024-04-06 NOTE — Progress Notes (Signed)
 Progress Note   Patient: Hailey Hawkins:[id number] DOB: [birth year] DOA: 04/04/2024     0 DOS: the patient was seen and examined on 04/06/2024   Brief hospital course: Hailey Hawkins is a 78 y.o. female with medical history significant of PAF on Eliquis, refractory HTN, mild aortic stenosis, breast cancer stage Ia status post chemo/immunotherapy, IDDM, HLD, hypothyroidism, asthma/COPD, presented with syncope.   Patient woke up in the middle night and went to bathroom and passed out.  Found out by husband who heard a loud thump and went to see what happened and found patient on the floor.  Estimated episode less than 1 minute.  Patient recovered consciousness and no postictal confusion denied any tongue biting loss of urine or bowel movement.  She does not remember that she had any prodrome such as lightheadedness palpitation shortness of breath before the episode.  She only complains about bilateral knee pain which is chronic.  She does have symptoms of occasional lightheadedness and exertional dyspnea in recent month.  She can tolerate 5 to 10 minutes walk before start to feel shortness of breath.  No chest pains no palpitation no nauseous vomiting.  She has been eating and drinking fine and no diarrhea.   ED Course: Afebrile, bradycardia heart rate 62 blood pressure 91/60.  Sinus rhythm on telemonitoring.  Blood work showed K4.1, glucose 352, BUN 24 creatinine 2.9, hemoglobin 11.9 WBC 7.5.  Chest x-ray appears to show mild pulmonary congestion but no acute infiltrates.     Assessment and Plan:   Syncope and collapse Appears to be vasovagal and also thought to be related to patient's known history of aortic stenosis Patient  had orthostatic blood pressure changes, Supine BP 140/77 (MAP 94) with HR 83 bpm; sitting BP 134/93 (MAP 106) with HR 94 bpm; standing BP (at 0 minutes) 102/84 (MAP 91) with HR 82 bpm.  Given history of AS, and other symptoms of recent decompensation such as exertional  dyspnea and lightheadedness, suspect progression of aortic stenosis.  2D echocardiogram was done and it showed an LVEF of 60 to 65% with normal LV systolic function.  Normal aortic valve, aortic valve sclerosis/calcification is present without evidence of stenosis Awaiting cardiology consult      PAF Continue Eliquis Continue propranolol for rate control    HTN Patient on several antihypertensive medications including clonidine, benazepril, hydralazine and Imdur Antihypertensive medications were placed on hold on admission and some will be resumed due to rebound hypertension Resume clonidine but at a decreased dose.  Resume Imdur     Prolonged Qtc Mild Follow-up repeat EKG     Pyuria Concerning for UTI Urine culture yielded E. Coli Will treat patient empirically with Rocephin while awaiting results of urine culture      Episode of agitation and Concerning for sundowning versus metabolic encephalopathy Was informed by RN that patient was very aggressive and difficult to reorient She received a one-time dose of Haldol and during my evaluation this morning, she appears to be back to her baseline.        Subjective: Patient seen and examined at bedside.  Sitting up in a recliner  Physical Exam: Vitals:   04/06/24 0016 04/06/24 0310 04/06/24 0700 04/06/24 1024  BP:  (!) 178/84 (!) 166/100 (!) 162/98  Pulse:  72 82   Resp:  18    Temp:  98.1 F (36.7 C) 97.9 F (36.6 C)   TempSrc:   Oral   SpO2:  97% 95%   Weight:  73.5 kg     Height: 5\' 5"  (1.651 m)      Eyes: PERRL, lids and conjunctivae normal ENMT: Mucous membranes are moist. Posterior pharynx clear of any exudate or lesions.Normal dentition.  Neck: normal, supple, no masses, no thyromegaly Respiratory: clear to auscultation bilaterally, no wheezing, no crackles. Normal respiratory effort. No accessory muscle use.  Cardiovascular: Regular rate and rhythm, No extremity edema. 2+ pedal pulses. No carotid bruits.   Abdomen: no tenderness, no masses palpated. No hepatosplenomegaly. Bowel sounds positive.  Musculoskeletal: no clubbing / cyanosis. No joint deformity upper and lower extremities. Good ROM, no contractures. Normal muscle tone.  Skin: no rashes, lesions, ulcers. No induration Neurologic: CN 2-12 grossly intact. Sensation intact, DTR normal. Strength 5/5 in all 4.  Psychiatric: Normal judgment and insight. Alert and oriented x 3. Normal mood.     Data Reviewed:  Labs reviewed.  Family Communication: Plan of care discussed with patient at the bedside.  All questions and concerns have been addressed.  Disposition: Status is: Observation The patient remains OBS appropriate and will d/c before 2 midnights.  Planned Discharge Destination: Home with Home Health    Time spent: 36  minutes  Author: Lucile Shutters, MD 04/06/2024 12:20 PM  For on call review [email or web address].

## 2024-04-06 NOTE — Consult Note (Signed)
 [facility] CLINIC CARDIOLOGY CONSULT NOTE       Patient ID: Hailey Hawkins MRN: [id number] DOB/AGE: 79-Sep-1947 79 y.o.  Admit date: 04/04/2024 Referring Physician Dr. Joylene Igo Primary Physician Sparks, Duane Lope, MD  Primary Cardiologist Dr. Corky Sing Reason for Consultation Syncope  HPI: Hailey Hawkins is a 78 y.o. female  with a past medical history of paroxysmal atrial fibrillation, mild aortic stenosis, hypertension, hyperlipidemia, type 2 diabetes mellitus who presented to the ED on 04/04/2024 for syncope. Cardiology was consulted for further evaluation.   Patient reports to the ED via EMS due to a syncopal episode.  Patient states 3 AM she got out of bed to walk to the bathroom.  As she was walking she felt dizzy and then lost consciousness.  Reports no head trauma.  Workup in the ED notable for blood glucose 252, sodium 137, potassium 3.5, creatinine 0.58, hemoglobin 11.9 urinary analysis with large leukocytes cloudy appearance. CT lumbar/thoracic spine with no acute traumatic injury.  Chest x-ray with no active disease. Echo yesterday with EF 60 to 65% with no regional wall motion abnormalities.  EKG in the ED with sinus rhythm rate 69 bpm with PACs.  Patient received fluids and restarted home medications Eliquis 5 mg twice daily, hydralazine, propranolol, Imdur.   At the time of my evaluation this afternoon, patient was resting comfortably with husband at bedside.  Patient states she woke up at 3 AM 2 nights ago to go to the bathroom and felt dizzy which led to a syncopal episode. Patient reports having diarrhea since admission.  Endorses intermittent mild shortness of breath for the past year with walking.  Patient denies any vomiting or chest pain. No concerning events on tele.   Review of systems complete and found to be negative unless listed above    Past Medical History:  Diagnosis Date   Anemia    pernicious   Asthma    Back pain    Basal cell carcinoma 03/17/2023   mid nasal  dorsum, Mohs 05/06/2023   Breast cancer (HCC) 2015   right c lumpectomy c radiation   DCIS (ductal carcinoma in situ) of breast    Diabetes mellitus without complication (HCC)    Elevated cholesterol    GERD (gastroesophageal reflux disease)    Hashimoto's disease    Hypertension    Hypothyroidism    MRSA infection    Obesity goither   Personal history of radiation therapy 2015   right breast DCIS   SIADH (syndrome of inappropriate ADH production) (HCC)    SOB (shortness of breath)    Tachycardia     Past Surgical History:  Procedure Laterality Date   ABDOMINAL HYSTERECTOMY     BREAST BIOPSY Right 11/2013   DCIS   BREAST LUMPECTOMY Right 2015   DCIS, clear margins   CESAREAN SECTION     x2   colononoscopy     COLONOSCOPY N/A 08/14/2016   Procedure: COLONOSCOPY;  Surgeon: Scot Jun, MD;  Location: [facility] ENDOSCOPY;  Service: Endoscopy;  Laterality: N/A;   COLONOSCOPY WITH ESOPHAGOGASTRODUODENOSCOPY (EGD)     ESOPHAGOGASTRODUODENOSCOPY (EGD) WITH PROPOFOL N/A 08/14/2016   Procedure: ESOPHAGOGASTRODUODENOSCOPY (EGD) WITH PROPOFOL;  Surgeon: Scot Jun, MD;  Location: [facility] ENDOSCOPY;  Service: Endoscopy;  Laterality: N/A;   INCISION AND DRAINAGE ABSCESS Left 10/18/2021   Procedure: INCISION AND DRAINAGE ABSCESS;  Surgeon: Carolan Shiver, MD;  Location: [facility] ORS;  Service: General;  Laterality: Left;  Right labia   MASTECTOMY     MASTECTOMY,  PARTIAL Right    REPLACEMENT TOTAL KNEE Right    REPLACEMENT TOTAL KNEE Left    THYROIDECTOMY, PARTIAL     caused throat paralysis  right side   TONSILLECTOMY      Medications Prior to Admission  Medication Sig Dispense Refill Last Dose/Taking   acetaminophen (TYLENOL) 500 MG tablet Take 1,000 mg by mouth every 6 (six) hours as needed for mild pain.   Taking As Needed   albuterol (PROVENTIL HFA;VENTOLIN HFA) 108 (90 BASE) MCG/ACT inhaler Inhale 2 puffs into the lungs every 4 (four) hours as needed for wheezing or shortness  of breath.   Taking As Needed   ALPRAZolam (XANAX) 0.5 MG tablet Take 0.5 mg by mouth at bedtime as needed for sleep.   Past Week   apixaban (ELIQUIS) 5 MG TABS tablet Take 5 mg by mouth 2 (two) times daily.   04/03/2024   atorvastatin (LIPITOR) 10 MG tablet Take 10 mg by mouth daily.    04/03/2024   benazepril (LOTENSIN) 40 MG tablet Take 40 mg by mouth daily.   04/03/2024   cloNIDine (CATAPRES) 0.2 MG tablet Take 0.2 mg by mouth 3 (three) times daily.   04/03/2024   desvenlafaxine (PRISTIQ) 25 MG 24 hr tablet Take 25 mg by mouth daily.   04/03/2024   empagliflozin (JARDIANCE) 25 MG TABS tablet Take 25 mg by mouth daily.   04/03/2024 Morning   hydrALAZINE (APRESOLINE) 50 MG tablet Take 50 mg by mouth 3 (three) times daily.   04/03/2024 Evening   insulin glargine (LANTUS) 100 UNIT/ML injection Inject 20 Units into the skin at bedtime.   04/03/2024 Bedtime   isosorbide mononitrate (IMDUR) 30 MG 24 hr tablet Take 30 mg by mouth 2 (two) times daily.   04/03/2024 Evening   levothyroxine (SYNTHROID) 75 MCG tablet Take 75 mcg by mouth daily before breakfast.   04/03/2024 Morning   metFORMIN (GLUCOPHAGE) 1000 MG tablet Take 1,000 mg by mouth 2 (two) times daily with a meal.   04/03/2024 Evening   Multiple Vitamin (MULTIVITAMIN) tablet Take 1 tablet by mouth daily.   04/03/2024   mupirocin ointment (BACTROBAN) 2 % Apply 1 Application topically 3 (three) times daily.   Past Week   omeprazole (PRILOSEC) 20 MG capsule Take 20 mg by mouth 2 (two) times daily before a meal.   04/03/2024 Evening   propranolol (INDERAL) 20 MG tablet Take 1 tablet by mouth 2 (two) times daily.   04/03/2024 Evening   QUEtiapine (SEROQUEL) 50 MG tablet Take 50 mg by mouth at bedtime.   04/03/2024 Bedtime   glucose blood (FREESTYLE LITE) test strip Use 2 (two) times daily      Insulin Pen Needle (NOVOFINE) 30G X 8 MM MISC as directed To inject insulin.      Insulin Syringe-Needle U-100 (INSULIN SYRINGE .5CC/31GX5/16") 31G X 5/16" 0.5 ML MISC Use 1 syringe daily  for insulin injections      OXYGEN Inhale 2 L into the lungs at bedtime.      Social History   Socioeconomic History   Marital status: Married    Spouse name: Not on file   Number of children: Not on file   Years of education: Not on file   Highest education level: Not on file  Occupational History   Not on file  Tobacco Use   Smoking status: Never   Smokeless tobacco: Never  Vaping Use   Vaping status: Never Used  Substance and Sexual Activity   Alcohol use: No  Alcohol/week: 0.0 standard drinks of alcohol   Drug use: No   Sexual activity: Never  Other Topics Concern   Not on file  Social History Narrative   Not on file   Social Drivers of Health   Financial Resource Strain: Low Risk  (08/28/2023)   Received from [facility]   Overall Financial Resource Strain (CARDIA)    Difficulty of Paying Living Expenses: Not hard at all  Food Insecurity: No Food Insecurity (04/05/2024)   Hunger Vital Sign    Worried About Running Out of Food in the Last Year: Never true    Ran Out of Food in the Last Year: Never true  Transportation Needs: No Transportation Needs (04/05/2024)   PRAPARE - Administrator, Civil Service (Medical): No    Lack of Transportation (Non-Medical): No  Physical Activity: Not on file  Stress: Not on file  Social Connections: Moderately Integrated (04/05/2024)   Social Connection and Isolation Panel [NHANES]    Frequency of Communication with Friends and Family: More than three times a week    Frequency of Social Gatherings with Friends and Family: More than three times a week    Attends Religious Services: More than 4 times per year    Active Member of [employer] or Organizations: No    Attends Banker Meetings: Never    Marital Status: Married  Catering manager Violence: Not At Risk (04/05/2024)   Humiliation, Afraid, Rape, and Kick questionnaire    Fear of Current or Ex-Partner: No    Emotionally Abused: No     Physically Abused: No    Sexually Abused: No    Family History  Problem Relation Age of Onset   Cancer Daughter        colon cancer; age 79   Colon cancer Daughter    Breast cancer Neg Hx      Vitals:   04/06/24 0310 04/06/24 0700 04/06/24 1024 04/06/24 1248  BP: (!) 178/84 (!) 166/100 (!) 162/98 (!) 176/103  Pulse: 72 82  80  Resp: 18     Temp: 98.1 F (36.7 C) 97.9 F (36.6 C)    TempSrc:  Oral    SpO2: 97% 95%  94%  Weight:      Height:        PHYSICAL EXAM General: Well-appearing elderly woman, well nourished, in no acute distress. HEENT: Normocephalic and atraumatic. Neck: No JVD.  Lungs: Normal respiratory effort on room air. Clear bilaterally to auscultation. No wheezes, crackles, rhonchi.  Heart: HRRR. Normal S1 and S2 without gallops or murmurs.  Abdomen: Non-distended appearing.  Msk: Normal strength and tone for age. Extremities: Warm and well perfused. No clubbing, cyanosis. No edema.  Neuro: Alert and oriented X 3. Psych: Answers questions appropriately.   Labs: Basic Metabolic Panel: Recent Labs    04/04/24 0635 04/06/24 0459  NA 135 137  K 4.1 3.5  CL 101 103  CO2 24 24  GLUCOSE 352* 169*  BUN 24* 15  CREATININE 0.98 0.58  CALCIUM 9.0 9.1  MG 1.9  --   PHOS 4.0  --    Liver Function Tests: No results for input(s): "AST", "ALT", "ALKPHOS", "BILITOT", "PROT", "ALBUMIN" in the last 72 hours. No results for input(s): "LIPASE", "AMYLASE" in the last 72 hours. CBC: Recent Labs    04/04/24 0635  WBC 7.5  HGB 11.9*  HCT 35.4*  MCV 87.4  PLT 199   Cardiac Enzymes: No results for input(s): "CKTOTAL", "CKMB", "  CKMBINDEX", "TROPONINIHS" in the last 72 hours. BNP: No results for input(s): "BNP" in the last 72 hours. D-Dimer: No results for input(s): "DDIMER" in the last 72 hours. Hemoglobin A1C: Recent Labs    04/04/24 0635  HGBA1C 7.4*   Fasting Lipid Panel: No results for input(s): "CHOL", "HDL", "LDLCALC", "TRIG", "CHOLHDL",  "LDLDIRECT" in the last 72 hours. Thyroid Function Tests: No results for input(s): "TSH", "T4TOTAL", "T3FREE", "THYROIDAB" in the last 72 hours.  Invalid input(s): "FREET3" Anemia Panel: No results for input(s): "VITAMINB12", "FOLATE", "FERRITIN", "TIBC", "IRON", "RETICCTPCT" in the last 72 hours.   Radiology: ECHOCARDIOGRAM COMPLETE Result Date: 04/05/2024    ECHOCARDIOGRAM REPORT   Patient Name:   BRIXTON SCHNAPP [facility] Date of Exam: 04/04/2024 Medical Rec #:  [id number]        Height:       65.0 in Accession #:    [id number]       Weight:       185.0 lb Date of Birth:  05-22-1946       BSA:          1.914 m Patient Age:    77 years         BP:           116/77 mmHg Patient Gender: F                HR:           72 bpm. Exam Location:  [facility] Procedure: 2D Echo, Cardiac Doppler and Color Doppler (Both Spectral and Color            Flow Doppler were utilized during procedure). Indications:     Syncope R55  History:         Patient has no prior history of Echocardiogram examinations.                  Signs/Symptoms:Shortness of Breath; Risk Factors:Hypertension.                  Tachycardia.  Sonographer:     Cristela Blue Referring Phys:  [id number] Emeline General Diagnosing Phys: Clotilde Dieter  Sonographer Comments: Technically challenging study due to limited acoustic windows and no apical window. IMPRESSIONS  1. Left ventricular ejection fraction, by estimation, is 60 to 65%. The left ventricle has normal function. The left ventricle has no regional wall motion abnormalities. Left ventricular diastolic parameters were normal.  2. Right ventricular systolic function is normal. The right ventricular size is normal.  3. The mitral valve is normal in structure. Trivial mitral valve regurgitation. No evidence of mitral stenosis.  4. The aortic valve is normal in structure. Aortic valve regurgitation is mild. Aortic valve sclerosis/calcification is present, without any evidence of aortic stenosis.  5. The inferior vena cava  is normal in size with greater than 50% respiratory variability, suggesting right atrial pressure of 3 mmHg. FINDINGS  Left Ventricle: Left ventricular ejection fraction, by estimation, is 60 to 65%. The left ventricle has normal function. The left ventricle has no regional wall motion abnormalities. The left ventricular internal cavity size was normal in size. There is  no left ventricular hypertrophy. Left ventricular diastolic parameters were normal. Right Ventricle: The right ventricular size is normal. No increase in right ventricular wall thickness. Right ventricular systolic function is normal. Left Atrium: Left atrial size was normal in size. Right Atrium: Right atrial size was normal in size. Pericardium: There is no evidence of pericardial effusion. Mitral Valve: The mitral valve  is normal in structure. Trivial mitral valve regurgitation. No evidence of mitral valve stenosis. Tricuspid Valve: The tricuspid valve is normal in structure. Tricuspid valve regurgitation is trivial. Aortic Valve: The aortic valve is normal in structure. Aortic valve regurgitation is mild. Aortic valve sclerosis/calcification is present, without any evidence of aortic stenosis. Pulmonic Valve: The pulmonic valve was normal in structure. Pulmonic valve regurgitation is not visualized. Aorta: The aortic root is normal in size and structure. Venous: The inferior vena cava is normal in size with greater than 50% respiratory variability, suggesting right atrial pressure of 3 mmHg. IAS/Shunts: No atrial level shunt detected by color flow Doppler.  LEFT VENTRICLE PLAX 2D LVIDd:         3.90 cm LVIDs:         2.70 cm LV PW:         1.50 cm LV IVS:        1.40 cm LVOT diam:     2.85 cm LVOT Area:     6.38 cm  LEFT ATRIUM         Index LA diam:    4.50 cm 2.35 cm/m   AORTA Ao Root diam: 3.50 cm TRICUSPID VALVE TR Peak grad:   12.7 mmHg TR Vmax:        178.00 cm/s  SHUNTS Systemic Diam: 2.85 cm Rozell Searing Custovic Electronically signed by  Clotilde Dieter Signature Date/Time: 04/05/2024/1:47:18 PM    Final    CT Lumbar Spine Wo Contrast Result Date: 04/04/2024 CLINICAL DATA:  78 year old female status post fall.  Pain. EXAM: CT LUMBAR SPINE WITHOUT CONTRAST TECHNIQUE: Multidetector CT imaging of the lumbar spine was performed without intravenous contrast administration. Multiplanar CT image reconstructions were also generated. RADIATION DOSE REDUCTION: This exam was performed according to the departmental dose-optimization program which includes automated exposure control, adjustment of the mA and/or kV according to patient size and/or use of iterative reconstruction technique. COMPARISON:  Thoracic CT today reported separately. Chest CT 05/04/2020. FINDINGS: Segmentation: Normal, concordant with the thoracic numbering today. Alignment: Mild levoconvex lumbar scoliosis. Maintained lumbar lordosis. Mild degenerative retrolisthesis L1-L2 and L2-L3. Vertebrae: Osteopenia. L1 vertebral height stable since 2021. Maintained other lumbar vertebral body height. Benign hemangioma of the L3 vertebral body (no follow-up imaging recommended). Lumbar vertebrae appear intact. Visible sacrum and SI joints appear intact. Paraspinal and other soft tissues: Negative costophrenic angles. Aortoiliac calcified atherosclerosis. Normal caliber abdominal aorta. Large bowel retained stool. Lumbar paraspinal soft tissues are within normal limits. Disc levels: Chronic vacuum disc at L1-L2. Mild degenerative lumbar spinal stenosis suspected there. And mild to moderate at L2-L3 related to combined disc and posterior element degeneration (sagittal image 32). Mild L3-L4 degeneration also suspected with bulky calcified ligament flavum hypertrophy contributing there. Otherwise L4-L5 and L5-S1 facet arthropathy. IMPRESSION: 1. Osteopenia. No acute traumatic injury identified in the Lumbar Spine. 2. 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  Electronically Signed   By: Odessa Fleming M.D.   On: 04/04/2024 08:40   CT Thoracic Spine Wo Contrast Result Date: 04/04/2024 CLINICAL DATA:  78 year old female status post fall.  Pain. EXAM: CT THORACIC SPINE WITHOUT CONTRAST TECHNIQUE: Multidetector CT images of the thoracic were obtained using the standard protocol without intravenous contrast. RADIATION DOSE REDUCTION: This exam was performed according to the departmental dose-optimization program which includes automated exposure control, adjustment of the mA and/or kV according to patient size and/or use of iterative reconstruction technique. COMPARISON:  Chest CT 05/04/2020. FINDINGS: Limited cervical spine imaging: Cervicothoracic  junction alignment is within normal limits. Chronic disc and endplate degeneration at that level. Thoracic spine segmentation:  Normal. Alignment: Stable thoracic kyphosis since 2021. Mild thoracic scoliosis. No spondylolisthesis. Vertebrae: Chronic osteopenia. Maintained thoracic vertebral height. Chronic interbody ankylosis appears degenerative at T6-T7. No acute thoracic vertebral fracture identified. Visible posterior ribs appear intact. Paraspinal and other soft tissues: Visible central airways, lungs demonstrate stable patency and ventilation. Calcified coronary artery atherosclerosis or stent. Calcified aortic atherosclerosis. No pericardial or pleural effusion is evident. Negative visible noncontrast upper abdominal viscera. Thoracic paraspinal soft tissues are within normal limits. Disc levels: Age-appropriate thoracic spine degeneration does not appear significantly changed from the 2021 CT. Lumbar spine reported separately. IMPRESSION: 1. No acute traumatic injury identified in the Thoracic Spine. 2. Stable CT appearance of the thoracic spine since 2021. 3.  Aortic Atherosclerosis (ICD10-I70.0). Electronically Signed   By: Odessa Fleming M.D.   On: 04/04/2024 08:28   DG Chest Portable 1 View Result Date: 04/04/2024 CLINICAL DATA:   Mechanical fall. EXAM: PORTABLE CHEST 1 VIEW COMPARISON:  04/01/2023. FINDINGS: Bilateral lung fields are clear. Bilateral costophrenic angles are clear. Note is made of elevated right hemidiaphragm. Normal cardio-mediastinal silhouette. No acute osseous abnormalities. The soft tissues are within normal limits. IMPRESSION: No active disease. Electronically Signed   By: Jules Schick M.D.   On: 04/04/2024 08:23   DG Knee 1-2 Views Right Result Date: 04/04/2024 CLINICAL DATA:  Mechanical fall.  Bilateral knee pain. EXAM: RIGHT KNEE - 1-2 VIEW; LEFT KNEE - 1-2 VIEW COMPARISON:  09/20/2012 and 04/21/2011. FINDINGS: No acute fracture or dislocation. No aggressive osseous lesion. Redemonstration of bilateral total knee arthroplasty with patellar resurfacing. The hardware is intact. No periprosthetic fracture or lucency. No interval change in alignment, when compared to the available prior examination. No knee effusion or focal soft tissue swelling. No radiopaque foreign bodies. IMPRESSION: *No acute osseous abnormality of the bilateral knees. Bilateral total knee arthroplasty without evidence of hardware failure. Electronically Signed   By: Jules Schick M.D.   On: 04/04/2024 08:22   DG Knee 1-2 Views Left Result Date: 04/04/2024 CLINICAL DATA:  Mechanical fall.  Bilateral knee pain. EXAM: RIGHT KNEE - 1-2 VIEW; LEFT KNEE - 1-2 VIEW COMPARISON:  09/20/2012 and 04/21/2011. FINDINGS: No acute fracture or dislocation. No aggressive osseous lesion. Redemonstration of bilateral total knee arthroplasty with patellar resurfacing. The hardware is intact. No periprosthetic fracture or lucency. No interval change in alignment, when compared to the available prior examination. No knee effusion or focal soft tissue swelling. No radiopaque foreign bodies. IMPRESSION: *No acute osseous abnormality of the bilateral knees. Bilateral total knee arthroplasty without evidence of hardware failure. Electronically Signed   By: Jules Schick M.D.   On: 04/04/2024 08:22   DG Pelvis 1-2 Views Result Date: 04/04/2024 CLINICAL DATA:  Mechanical fall. EXAM: PELVIS - 1-2 VIEW COMPARISON:  None Available. FINDINGS: Pelvis is intact with normal and symmetric sacroiliac joints. No acute fracture or dislocation. No aggressive osseous lesion. Visualized sacral arcuate lines are unremarkable. There are changes of chronic pubic symphisitis. There are moderate to severe degenerative changes of bilateral hip joints (right > left), in the form of reduced joint space, subchondral sclerosis / cystic changes and osteophytosis. No radiopaque foreign bodies. IMPRESSION: *No acute osseous abnormality of the pelvis. Electronically Signed   By: Jules Schick M.D.   On: 04/04/2024 08:19   CT Head Wo Contrast Result Date: 04/04/2024 CLINICAL DATA:  Mental status change.  Syncope.  No trauma.  EXAM: CT HEAD WITHOUT CONTRAST TECHNIQUE: Contiguous axial images were obtained from the base of the skull through the vertex without intravenous contrast. RADIATION DOSE REDUCTION: This exam was performed according to the departmental dose-optimization program which includes automated exposure control, adjustment of the mA and/or kV according to patient size and/or use of iterative reconstruction technique. COMPARISON:  Brain MRI from 05/15/2014 FINDINGS: Brain: No evidence of acute infarction, hemorrhage, hydrocephalus, extra-axial collection or mass lesion/mass effect. There is mild diffuse low-attenuation within the subcortical and periventricular white matter compatible with chronic microvascular disease. Vascular: No hyperdense vessel or unexpected calcification. Skull: Normal. Negative for fracture or focal lesion. Sinuses/Orbits: There is mild mucosal thickening involving the left maxillary sinus and sphenoid sinus. Other: None. IMPRESSION: 1. No acute intracranial abnormalities. 2. Chronic microvascular disease. 3. Mild paranasal sinus disease. Electronically Signed    By: Signa Kell M.D.   On: 04/04/2024 07:30    ECHO as above  TELEMETRY reviewed by me 04/06/2024: Sinus tachycardia, rate 100s  EKG reviewed by me: sinus rhythm rate 69 bpm with PACs.  Data reviewed by me 04/06/2024: last 24h vitals tele labs imaging I/O ED provider note, admission H&P, hospitalist progress note  Principal Problem:   Syncope    ASSESSMENT AND PLAN:  Hailey Hawkins is a 78 y.o. female  with a past medical history of paroxysmal atrial fibrillation, mild aortic stenosis, hypertension, hyperlipidemia, type 2 diabetes mellitus who presented to the ED on 04/04/2024 for syncope. Cardiology was consulted for further evaluation.   # Syncopal Episode  # Paroxsymal atrial fibrillation # Hypertension # Urinary tract infection, E. coli Patient presented to the ED after having a syncopal episode with prodrome of dizziness and diarrhea for the past 2 days. Patient has history of paroxysmal atrial fibrillation however has been in sinus rhythm since admissions with controlled heart rates.  Echo this admission with EF 60 to 65% with no regional wall motion abnormality.  She has been hypertensive with normal heart rates.  -Continue Eliquis 5 mg twice daily for stroke risk reduction. -Resume home benazepril 40 mg daily, clonidine 0.1 mg twice daily. Can consider resuming home hydralazine if BP remains elevated. -Continue Imdur 30 mg twice daily -Switch to metoprolol succinate 25 mg daily from home propranolol. -Continue atorvastatin 10 mg daily -Encouraged patient to walk around the unit and to stay hydrated. -Plan for monitor placement prior to discharge.   This patient's plan of care was discussed and created with Dr. Corky Sing and he is in agreement.  Signed: Gale Journey, PA-C  04/06/2024, 2:52 PM [facility]

## 2024-04-07 DIAGNOSIS — R55 Syncope and collapse: Secondary | ICD-10-CM | POA: Diagnosis not present

## 2024-04-07 LAB — GLUCOSE, CAPILLARY
Glucose-Capillary: 226 mg/dL — ABNORMAL HIGH (ref 70–99)
Glucose-Capillary: 241 mg/dL — ABNORMAL HIGH (ref 70–99)
Glucose-Capillary: 304 mg/dL — ABNORMAL HIGH (ref 70–99)
Glucose-Capillary: 70 mg/dL (ref 70–99)
Glucose-Capillary: 280 mg/dL — ABNORMAL HIGH (ref 70–99)

## 2024-04-07 LAB — BASIC METABOLIC PANEL WITH GFR
Anion gap: 10 (ref 5–15)
BUN: 25 mg/dL — ABNORMAL HIGH (ref 8–23)
CO2: 24 mmol/L (ref 22–32)
Calcium: 9 mg/dL (ref 8.9–10.3)
Chloride: 102 mmol/L (ref 98–111)
Creatinine, Ser: 0.63 mg/dL (ref 0.44–1.00)
GFR, Estimated: >60 mL/min (ref >60)
Glucose, Bld: 229 mg/dL — ABNORMAL HIGH (ref 70–99)
Potassium: 3.3 mmol/L — ABNORMAL LOW (ref 3.5–5.1)
Sodium: 136 mmol/L (ref 135–145)

## 2024-04-07 LAB — URINE CULTURE
Culture: >=100000 — AB
Special Requests: NORMAL

## 2024-04-07 MED ORDER — INSULIN ASPART 100 UNIT/ML IJ SOLN: 4.0000 [IU] | Freq: Three times a day (TID) | INTRAMUSCULAR | Status: DC

## 2024-04-07 MED ORDER — HYDRALAZINE HCL 25 MG PO TABS
25.0000 mg | ORAL_TABLET | Freq: Three times a day (TID) | ORAL | Status: DC
  Filled 2024-04-07: qty 1

## 2024-04-07 MED ORDER — INSULIN ASPART 100 UNIT/ML IJ SOLN
0.0000 [IU] | Freq: Three times a day (TID) | INTRAMUSCULAR | Status: DC
  Administered 2024-04-08: 5 [IU] via SUBCUTANEOUS
  Administered 2024-04-08: 8 [IU] via SUBCUTANEOUS
  Filled 2024-04-07 (×2): qty 1

## 2024-04-07 MED ORDER — INSULIN GLARGINE-YFGN 100 UNIT/ML ~~LOC~~ SOLN
20.0000 [IU] | SUBCUTANEOUS | Status: DC
  Administered 2024-04-07: 20 [IU] via SUBCUTANEOUS
  Filled 2024-04-07 (×3): qty 0.2

## 2024-04-07 MED ORDER — INSULIN ASPART 100 UNIT/ML IJ SOLN: 2.0000 [IU] | Freq: Three times a day (TID) | INTRAMUSCULAR | Status: DC

## 2024-04-07 MED ORDER — MECLIZINE HCL 25 MG PO TABS
25.0000 mg | ORAL_TABLET | Freq: Three times a day (TID) | ORAL | Status: DC
  Administered 2024-04-07 – 2024-04-08 (×5): 25 mg via ORAL
  Filled 2024-04-07 (×6): qty 1

## 2024-04-07 MED ORDER — POTASSIUM CHLORIDE CRYS ER 20 MEQ PO TBCR
40.0000 meq | EXTENDED_RELEASE_TABLET | Freq: Once | ORAL | Status: AC
  Administered 2024-04-07: 40 meq via ORAL
  Filled 2024-04-07: qty 2

## 2024-04-07 NOTE — Progress Notes (Addendum)
 Progress Note   Patient: Hailey Hawkins URK:[id number] DOB: 20-Nov-1946 DOA: 04/04/2024     0 DOS: the patient was seen and examined on 04/07/2024   Brief hospital course: Hailey Hawkins is a 78 y.o. female with medical history significant of PAF on Eliquis, refractory HTN, mild aortic stenosis, breast cancer stage Ia status post chemo/immunotherapy, IDDM, HLD, hypothyroidism, asthma/COPD, presented with syncope.   Patient woke up in the middle night and went to bathroom and passed out.  Found out by husband who heard a loud thump and went to see what happened and found patient on the floor.  Estimated episode less than 1 minute.  Patient recovered consciousness and no postictal confusion denied any tongue biting loss of urine or bowel movement.  She does not remember that she had any prodrome such as lightheadedness palpitation shortness of breath before the episode.  She only complains about bilateral knee pain which is chronic.  She does have symptoms of occasional lightheadedness and exertional dyspnea in recent month.  She can tolerate 5 to 10 minutes walk before start to feel shortness of breath.  No chest pains no palpitation no nauseous vomiting.  She has been eating and drinking fine and no diarrhea.   ED Course: Afebrile, bradycardia heart rate 62 blood pressure 91/60.  Sinus rhythm on telemonitoring.  Blood work showed K4.1, glucose 352, BUN 24 creatinine 2.9, hemoglobin 11.9 WBC 7.5.  Chest x-ray appears to show mild pulmonary congestion but no acute infiltrates.     Assessment and Plan:  Syncope and collapse Appears to be vasovagal and also thought to be related to patient's known history of aortic stenosis Patient  had orthostatic blood pressure changes that have resolved, Supine BP 140/77 (MAP 94) with HR 83 bpm; sitting BP 134/93 (MAP 106) with HR 94 bpm; standing BP (at 0 minutes) 102/84 (MAP 91) with HR 82 bpm.  Given history of AS, and other symptoms of recent decompensation  such as exertional dyspnea and lightheadedness, suspect progression of aortic stenosis.  2D echocardiogram was done and it showed an LVEF of 60 to 65% with normal LV systolic function.  Normal aortic valve, aortic valve sclerosis/calcification is present without evidence of stenosis Appreciate cardiology input       PAF Continue Eliquis Continue propranolol for rate control     HTN Patient on several antihypertensive medications including clonidine, benazepril, hydralazine and Imdur Antihypertensive medications were placed on hold on admission and some will be resumed due to rebound hypertension Resume clonidine but at a decreased dose.  Resume Imdur Continue to hold hydralazine and benazepril     Prolonged Qtc Mild Follow-up repeat EKG     Urinary tract infection Patient with significant pyuria Urine culture yielded E. Coli Continue empiric antibiotic therapy with IV Rocephin         Episode of agitation Acute metabolic encephalopathy Concerning for sundowning versus metabolic encephalopathy Was informed by RN that patient was very aggressive and difficult to reorient She received a one-time dose of Haldol and during my evaluation this morning, she appears to be back to her baseline. Has not had any further episodes of agitation      Dizziness ??  Vestibular dysfunction No witnessed nystagmus Trial of meclizine Continue PT    Hypokalemia Supplement potassium    Diabetes mellitus with hyperglycemia Increase Semglee to 20 units Place patient on Premeal NovoLog Continue consistent carbohydrate diet     Subjective: Remains dizzy and unable to ambulate  Physical Exam: Vitals:  04/07/24 0757 04/07/24 1018 04/07/24 1024 04/07/24 1247  BP: (!) 157/102 111/70 112/78 128/73  Pulse: 80 91 (!) 102 75  Resp:      Temp: 98 F (36.7 C)   97.8 F (36.6 C)  TempSrc:      SpO2: 97%   95%  Weight:      Height:       Eyes: PERRL, lids and conjunctivae  normal ENMT: Mucous membranes are moist. Posterior pharynx clear of any exudate or lesions.Normal dentition.  Neck: normal, supple, no masses, no thyromegaly Respiratory: clear to auscultation bilaterally, no wheezing, no crackles. Normal respiratory effort. No accessory muscle use.  Cardiovascular: Regular rate and rhythm, No extremity edema. 2+ pedal pulses. No carotid bruits.  Abdomen: no tenderness, no masses palpated. No hepatosplenomegaly. Bowel sounds positive.  Musculoskeletal: no clubbing / cyanosis. No joint deformity upper and lower extremities. Good ROM, no contractures. Normal muscle tone.  Skin: no rashes, lesions, ulcers. No induration Neurologic: CN 2-12 grossly intact. Sensation intact, DTR normal. Strength 5/5 in all 4.  Psychiatric: Normal judgment and insight. Alert and oriented x 3. Normal mood.    Data Reviewed: Potassium 3.3, glucose 229 Labs reviewed  Family Communication: Plan of care was discussed with patient in detail.  She verbalizes understanding and agrees with the plan.  Disposition: Status is: Observation The patient remains OBS appropriate and will d/c before 2 midnights.  Planned Discharge Destination: Home with Home Health    Time spent: 36 minutes  Author: Lucile Shutters, MD 04/07/2024 1:29 PM  For on call review [email or web address].

## 2024-04-07 NOTE — Progress Notes (Signed)
 Occupational Therapy Treatment Patient Details Name: Hailey Hawkins MRN: [id number] DOB: 16-Sep-1946 Today's Date: 04/07/2024   History of present illness Pt is a 78 y.o. female presenting to hospital 04/04/24 with c/o syncope.  Pt admitted with syncope and fall, PAF.  PMH includes PAF on Eliquis, refractory htn, mild aortic stenosis, R breast CA stage Ia s/p chemo/immunotherapy/lumpectomy, IIDDM, HLD, asthma/COPD, B TKR.   OT comments  Pt seen for OT/PT treatment on this date. Upon arrival to room pt resting in supine, agreeable to tx. Bed mobility supervision with extra time to complete. Pt completed face washing (set up) and donning/doffing of bilateral socks while seated on EOB (supervision). No physcial assistance to complete ADL tasks. Pt amb to BR with CGA + 2 and the RW with no noted LOB. Deferred further mobility due to increased level of dizziness. Orthostatic vitals taken, pt unable to tolerate prolonged standing to completed. RN in room to assess. Pt retired in bed with all needs in reach. Pt making good progress toward goals, will continue to follow POC. Discharge recommendation remains appropriate.    Orthostatic vitals:  Sitting BP: 120/108 (MAP 114), HR 94 Standing BP: 112/178 (MAP 90), HR 102 (unable to stand for full 3 minutes.       If plan is discharge home, recommend the following:  A little help with walking and/or transfers;A lot of help with bathing/dressing/bathroom;A little help with bathing/dressing/bathroom;Assistance with cooking/housework;Assist for transportation;Help with stairs or ramp for entrance   Equipment Recommendations  BSC/3in1;Tub/shower seat    Recommendations for Other Services      Precautions / Restrictions Precautions Precautions: Fall Restrictions Weight Bearing Restrictions Per Provider Order: No       Mobility Bed Mobility Overal bed mobility: Needs Assistance Bed Mobility: Supine to Sit, Sit to Supine     Supine to sit:  Supervision Sit to supine: Supervision   General bed mobility comments: increased time, no physical assistance required    Transfers Overall transfer level: Needs assistance Equipment used: Rolling walker (2 wheels) Transfers: Sit to/from Stand Sit to Stand: Contact guard assist Stand pivot transfers: Contact guard assist, Min assist               Balance Overall balance assessment: Needs assistance Sitting-balance support: No upper extremity supported, Feet supported Sitting balance-Leahy Scale: Good     Standing balance support: Bilateral upper extremity supported, Reliant on assistive device for balance Standing balance-Leahy Scale: Fair                             ADL either performed or assessed with clinical judgement   ADL Overall ADL's : Needs assistance/impaired Eating/Feeding: Modified independent   Grooming: Wash/dry hands;Sitting;Set up           Upper Body Dressing : Set up;Sitting   Lower Body Dressing: Supervision/safety Lower Body Dressing Details (indicate cue type and reason): Donning/doffing bilateral socks while seated on EOB Toilet Transfer: Contact guard assist;Cueing for safety;Ambulation;Regular Toilet;Rolling walker (2 wheels)   Toileting- Clothing Manipulation and Hygiene: Supervision/safety;Sitting/lateral lean       Functional mobility during ADLs: Contact guard assist;+2 for safety/equipment;Rolling walker (2 wheels) General ADL Comments: Pt completed face washing (set up) and donning/doffing of bilateral socks while seated on EOB (supervision). No physcial assistance to complete ADL tasks.    Extremity/Trunk Assessment Upper Extremity Assessment Upper Extremity Assessment: Generalized weakness;Overall [facility] for tasks assessed   Lower Extremity Assessment Lower Extremity Assessment: Generalized  weakness;Defer to PT evaluation;Overall WFL for tasks assessed        Vision   Vision Assessment?: Yes Ocular Range of  Motion: Within Functional Limits Alignment/Gaze Preference: Within Defined Limits Tracking/Visual Pursuits: Able to track stimulus in all quads without difficulty Saccades: Within functional limits Convergence: Within functional limits Additional Comments: Noted possible vestibular related dizziness, will continue to assess   Perception     Praxis     Communication Communication Communication: No apparent difficulties   Cognition Arousal: Alert Behavior During Therapy: WFL for tasks assessed/performed Cognition: No apparent impairments             OT - Cognition Comments: A/O x4                 Following commands: Impaired Following commands impaired: Follows multi-step commands with increased time      Cueing   Cueing Techniques: Verbal cues  Exercises Exercises: Other exercises Other Exercises Other Exercises: Edu: dizziness prevention during standing, DME hand placement    Shoulder Instructions       General Comments Pt reported dizziness at rest and with mobility. Orthostatics taken during session.    Pertinent Vitals/ Pain       Pain Assessment Pain Assessment: No/denies pain  Home Living                                          Prior Functioning/Environment              Frequency  Min 2X/week        Progress Toward Goals  OT Goals(current goals can now be found in the care plan section)  Progress towards OT goals: Progressing toward goals  Acute Rehab OT Goals Patient Stated Goal: improve dizziness/function to return home OT Goal Formulation: With patient Time For Goal Achievement: 04/18/24 Potential to Achieve Goals: Fair ADL Goals Pt Will Perform Lower Body Bathing: with supervision;sit to/from stand;sitting/lateral leans Pt Will Perform Lower Body Dressing: with supervision;sit to/from stand;sitting/lateral leans Pt Will Transfer to Toilet: with supervision;with modified independence;regular height  toilet;ambulating Pt Will Perform Toileting - Clothing Manipulation and hygiene: with modified independence;with supervision;sit to/from stand;sitting/lateral leans  Plan      Co-evaluation    PT/OT/SLP Co-Evaluation/Treatment: Yes Reason for Co-Treatment: Complexity of the patient's impairments (multi-system involvement);For patient/therapist safety   OT goals addressed during session: ADL's and self-care;Proper use of Adaptive equipment and DME      AM-PAC OT "6 Clicks" Daily Activity     Outcome Measure   Help from another person eating meals?: None Help from another person taking care of personal grooming?: A Little Help from another person toileting, which includes using toliet, bedpan, or urinal?: A Little Help from another person bathing (including washing, rinsing, drying)?: A Lot Help from another person to put on and taking off regular upper body clothing?: A Little Help from another person to put on and taking off regular lower body clothing?: A Lot 6 Click Score: 17    End of Session Equipment Utilized During Treatment: Rolling walker (2 wheels);Gait belt  OT Visit Diagnosis: Other abnormalities of gait and mobility (R26.89);Muscle weakness (generalized) (M62.81);Dizziness and giddiness (R42)   Activity Tolerance Patient tolerated treatment well   Patient Left in bed;with call bell/phone within reach;with bed alarm set   Nurse Communication Mobility status        Time: 1610-9604 OT Time  Calculation (min): 34 min  Charges: OT General Charges $OT Visit: 1 Visit OT Treatments $Self Care/Home Management : 8-22 mins  Glenard Haring M.S. OTR/L  04/07/24, 1:00 PM

## 2024-04-07 NOTE — Progress Notes (Signed)
 [facility] CLINIC CARDIOLOGY PROGRESS NOTE       Patient ID: Hailey Hawkins MRN: [id number] DOB/AGE: 1946-07-18 78 y.o.  Admit date: 04/04/2024 Referring Physician Dr. Joylene Igo Primary Physician Sparks, Duane Lope, MD  Primary Cardiologist Dr. Corky Sing Reason for Consultation Syncope  HPI: Hailey Hawkins is a 78 y.o. female  with a past medical history of paroxysmal atrial fibrillation, mild aortic stenosis, hypertension, hyperlipidemia, type 2 diabetes mellitus who presented to the ED on 04/04/2024 for syncope. Cardiology was consulted for further evaluation.   Interval history: -Patient seen and examined this morning, resting comfortably in hospital bed.  Feeling better overall. -States she has been out of bed to the bathroom and did not have any dizziness with this. -Heart rate remained stable without any arrhythmias on telemetry.  Review of systems complete and found to be negative unless listed above    Past Medical History:  Diagnosis Date   Anemia    pernicious   Asthma    Back pain    Basal cell carcinoma 03/17/2023   mid nasal dorsum, Mohs 05/06/2023   Breast cancer (HCC) 2015   right c lumpectomy c radiation   DCIS (ductal carcinoma in situ) of breast    Diabetes mellitus without complication (HCC)    Elevated cholesterol    GERD (gastroesophageal reflux disease)    Hashimoto's disease    Hypertension    Hypothyroidism    MRSA infection    Obesity goither   Personal history of radiation therapy 2015   right breast DCIS   SIADH (syndrome of inappropriate ADH production) (HCC)    SOB (shortness of breath)    Tachycardia     Past Surgical History:  Procedure Laterality Date   ABDOMINAL HYSTERECTOMY     BREAST BIOPSY Right 11/2013   DCIS   BREAST LUMPECTOMY Right 2015   DCIS, clear margins   CESAREAN SECTION     x2   colononoscopy     COLONOSCOPY N/A 08/14/2016   Procedure: COLONOSCOPY;  Surgeon: Scot Jun, MD;  Location: [facility] ENDOSCOPY;  Service:  Endoscopy;  Laterality: N/A;   COLONOSCOPY WITH ESOPHAGOGASTRODUODENOSCOPY (EGD)     ESOPHAGOGASTRODUODENOSCOPY (EGD) WITH PROPOFOL N/A 08/14/2016   Procedure: ESOPHAGOGASTRODUODENOSCOPY (EGD) WITH PROPOFOL;  Surgeon: Scot Jun, MD;  Location: [facility] ENDOSCOPY;  Service: Endoscopy;  Laterality: N/A;   INCISION AND DRAINAGE ABSCESS Left 10/18/2021   Procedure: INCISION AND DRAINAGE ABSCESS;  Surgeon: Carolan Shiver, MD;  Location: [facility] ORS;  Service: General;  Laterality: Left;  Right labia   MASTECTOMY     MASTECTOMY, PARTIAL Right    REPLACEMENT TOTAL KNEE Right    REPLACEMENT TOTAL KNEE Left    THYROIDECTOMY, PARTIAL     caused throat paralysis  right side   TONSILLECTOMY      Medications Prior to Admission  Medication Sig Dispense Refill Last Dose/Taking   acetaminophen (TYLENOL) 500 MG tablet Take 1,000 mg by mouth every 6 (six) hours as needed for mild pain.   Taking As Needed   albuterol (PROVENTIL HFA;VENTOLIN HFA) 108 (90 BASE) MCG/ACT inhaler Inhale 2 puffs into the lungs every 4 (four) hours as needed for wheezing or shortness of breath.   Taking As Needed   ALPRAZolam (XANAX) 0.5 MG tablet Take 0.5 mg by mouth at bedtime as needed for sleep.   Past Week   apixaban (ELIQUIS) 5 MG TABS tablet Take 5 mg by mouth 2 (two) times daily.   04/03/2024   atorvastatin (LIPITOR) 10 MG tablet  Take 10 mg by mouth daily.    04/03/2024   benazepril (LOTENSIN) 40 MG tablet Take 40 mg by mouth daily.   04/03/2024   cloNIDine (CATAPRES) 0.2 MG tablet Take 0.2 mg by mouth 3 (three) times daily.   04/03/2024   desvenlafaxine (PRISTIQ) 25 MG 24 hr tablet Take 25 mg by mouth daily.   04/03/2024   empagliflozin (JARDIANCE) 25 MG TABS tablet Take 25 mg by mouth daily.   04/03/2024 Morning   hydrALAZINE (APRESOLINE) 50 MG tablet Take 50 mg by mouth 3 (three) times daily.   04/03/2024 Evening   insulin glargine (LANTUS) 100 UNIT/ML injection Inject 20 Units into the skin at bedtime.   04/03/2024 Bedtime    isosorbide mononitrate (IMDUR) 30 MG 24 hr tablet Take 30 mg by mouth 2 (two) times daily.   04/03/2024 Evening   levothyroxine (SYNTHROID) 75 MCG tablet Take 75 mcg by mouth daily before breakfast.   04/03/2024 Morning   metFORMIN (GLUCOPHAGE) 1000 MG tablet Take 1,000 mg by mouth 2 (two) times daily with a meal.   04/03/2024 Evening   Multiple Vitamin (MULTIVITAMIN) tablet Take 1 tablet by mouth daily.   04/03/2024   mupirocin ointment (BACTROBAN) 2 % Apply 1 Application topically 3 (three) times daily.   Past Week   omeprazole (PRILOSEC) 20 MG capsule Take 20 mg by mouth 2 (two) times daily before a meal.   04/03/2024 Evening   propranolol (INDERAL) 20 MG tablet Take 1 tablet by mouth 2 (two) times daily.   04/03/2024 Evening   QUEtiapine (SEROQUEL) 50 MG tablet Take 50 mg by mouth at bedtime.   04/03/2024 Bedtime   glucose blood (FREESTYLE LITE) test strip Use 2 (two) times daily      Insulin Pen Needle (NOVOFINE) 30G X 8 MM MISC as directed To inject insulin.      Insulin Syringe-Needle U-100 (INSULIN SYRINGE .5CC/31GX5/16") 31G X 5/16" 0.5 ML MISC Use 1 syringe daily for insulin injections      OXYGEN Inhale 2 L into the lungs at bedtime.      Social History   Socioeconomic History   Marital status: Married    Spouse name: Not on file   Number of children: Not on file   Years of education: Not on file   Highest education level: Not on file  Occupational History   Not on file  Tobacco Use   Smoking status: Never   Smokeless tobacco: Never  Vaping Use   Vaping status: Never Used  Substance and Sexual Activity   Alcohol use: No    Alcohol/week: 0.0 standard drinks of alcohol   Drug use: No   Sexual activity: Never  Other Topics Concern   Not on file  Social History Narrative   Not on file   Social Drivers of Health   Financial Resource Strain: Low Risk  (08/28/2023)   Received from [facility]   Overall Financial Resource Strain (CARDIA)    Difficulty of Paying Living  Expenses: Not hard at all  Food Insecurity: No Food Insecurity (04/05/2024)   Hunger Vital Sign    Worried About Running Out of Food in the Last Year: Never true    Ran Out of Food in the Last Year: Never true  Transportation Needs: No Transportation Needs (04/05/2024)   PRAPARE - Administrator, Civil Service (Medical): No    Lack of Transportation (Non-Medical): No  Physical Activity: Not on file  Stress: Not on file  Social Connections: Moderately Integrated (04/05/2024)   Social Connection and Isolation Panel [NHANES]    Frequency of Communication with Friends and Family: More than three times a week    Frequency of Social Gatherings with Friends and Family: More than three times a week    Attends Religious Services: More than 4 times per year    Active Member of Clubs or Organizations: No    Attends Banker Meetings: Never    Marital Status: Married  Catering manager Violence: Not At Risk (04/05/2024)   Humiliation, Afraid, Rape, and Kick questionnaire    Fear of Current or Ex-Partner: No    Emotionally Abused: No    Physically Abused: No    Sexually Abused: No    Family History  Problem Relation Age of Onset   Cancer Daughter        colon cancer; age 8   Colon cancer Daughter    Breast cancer Neg Hx      Vitals:   04/06/24 2340 04/07/24 0500 04/07/24 0503 04/07/24 0757  BP: (!) 148/82  (!) 151/80 (!) 157/102  Pulse: 84  73 80  Resp: 17  17   Temp: 97.7 F (36.5 C)  97.8 F (36.6 C) 98 F (36.7 C)  TempSrc: Oral  Oral   SpO2: 95%  96% 97%  Weight:  74.9 kg    Height:        PHYSICAL EXAM General: Well-appearing elderly woman, well nourished, in no acute distress. HEENT: Normocephalic and atraumatic. Neck: No JVD.  Lungs: Normal respiratory effort on room air. Clear bilaterally to auscultation. No wheezes, crackles, rhonchi.  Heart: HRRR. Normal S1 and S2 without gallops or murmurs.  Abdomen: Non-distended appearing.  Msk: Normal strength  and tone for age. Extremities: Warm and well perfused. No clubbing, cyanosis. No edema.  Neuro: Alert and oriented X 3. Psych: Answers questions appropriately.   Labs: Basic Metabolic Panel: Recent Labs    04/06/24 0459 04/07/24 0356  NA 137 136  K 3.5 3.3*  CL 103 102  CO2 24 24  GLUCOSE 169* 229*  BUN 15 25*  CREATININE 0.58 0.63  CALCIUM 9.1 9.0   Liver Function Tests: No results for input(s): "AST", "ALT", "ALKPHOS", "BILITOT", "PROT", "ALBUMIN" in the last 72 hours. No results for input(s): "LIPASE", "AMYLASE" in the last 72 hours. CBC: No results for input(s): "WBC", "NEUTROABS", "HGB", "HCT", "MCV", "PLT" in the last 72 hours.  Cardiac Enzymes: No results for input(s): "CKTOTAL", "CKMB", "CKMBINDEX", "TROPONINIHS" in the last 72 hours. BNP: No results for input(s): "BNP" in the last 72 hours. D-Dimer: No results for input(s): "DDIMER" in the last 72 hours. Hemoglobin A1C: No results for input(s): "HGBA1C" in the last 72 hours.  Fasting Lipid Panel: No results for input(s): "CHOL", "HDL", "LDLCALC", "TRIG", "CHOLHDL", "LDLDIRECT" in the last 72 hours. Thyroid Function Tests: No results for input(s): "TSH", "T4TOTAL", "T3FREE", "THYROIDAB" in the last 72 hours.  Invalid input(s): "FREET3" Anemia Panel: No results for input(s): "VITAMINB12", "FOLATE", "FERRITIN", "TIBC", "IRON", "RETICCTPCT" in the last 72 hours.   Radiology: ECHOCARDIOGRAM COMPLETE Result Date: 04/05/2024    ECHOCARDIOGRAM REPORT   Patient Name:   Hailey Hawkins [facility] Date of Exam: 04/04/2024 Medical Rec #:  [id number]        Height:       65.0 in Accession #:    [id number]       Weight:       185.0 lb Date of Birth:  1946/05/10  BSA:          1.914 m Patient Age:    77 years         BP:           116/77 mmHg Patient Gender: F                HR:           72 bpm. Exam Location:  [facility] Procedure: 2D Echo, Cardiac Doppler and Color Doppler (Both Spectral and Color            Flow Doppler were utilized  during procedure). Indications:     Syncope R55  History:         Patient has no prior history of Echocardiogram examinations.                  Signs/Symptoms:Shortness of Breath; Risk Factors:Hypertension.                  Tachycardia.  Sonographer:     Cristela Blue Referring Phys:  [id number] Emeline General Diagnosing Phys: Clotilde Dieter  Sonographer Comments: Technically challenging study due to limited acoustic windows and no apical window. IMPRESSIONS  1. Left ventricular ejection fraction, by estimation, is 60 to 65%. The left ventricle has normal function. The left ventricle has no regional wall motion abnormalities. Left ventricular diastolic parameters were normal.  2. Right ventricular systolic function is normal. The right ventricular size is normal.  3. The mitral valve is normal in structure. Trivial mitral valve regurgitation. No evidence of mitral stenosis.  4. The aortic valve is normal in structure. Aortic valve regurgitation is mild. Aortic valve sclerosis/calcification is present, without any evidence of aortic stenosis.  5. The inferior vena cava is normal in size with greater than 50% respiratory variability, suggesting right atrial pressure of 3 mmHg. FINDINGS  Left Ventricle: Left ventricular ejection fraction, by estimation, is 60 to 65%. The left ventricle has normal function. The left ventricle has no regional wall motion abnormalities. The left ventricular internal cavity size was normal in size. There is  no left ventricular hypertrophy. Left ventricular diastolic parameters were normal. Right Ventricle: The right ventricular size is normal. No increase in right ventricular wall thickness. Right ventricular systolic function is normal. Left Atrium: Left atrial size was normal in size. Right Atrium: Right atrial size was normal in size. Pericardium: There is no evidence of pericardial effusion. Mitral Valve: The mitral valve is normal in structure. Trivial mitral valve regurgitation. No  evidence of mitral valve stenosis. Tricuspid Valve: The tricuspid valve is normal in structure. Tricuspid valve regurgitation is trivial. Aortic Valve: The aortic valve is normal in structure. Aortic valve regurgitation is mild. Aortic valve sclerosis/calcification is present, without any evidence of aortic stenosis. Pulmonic Valve: The pulmonic valve was normal in structure. Pulmonic valve regurgitation is not visualized. Aorta: The aortic root is normal in size and structure. Venous: The inferior vena cava is normal in size with greater than 50% respiratory variability, suggesting right atrial pressure of 3 mmHg. IAS/Shunts: No atrial level shunt detected by color flow Doppler.  LEFT VENTRICLE PLAX 2D LVIDd:         3.90 cm LVIDs:         2.70 cm LV PW:         1.50 cm LV IVS:        1.40 cm LVOT diam:     2.85 cm LVOT Area:     6.38 cm  LEFT ATRIUM         Index LA diam:    4.50 cm 2.35 cm/m   AORTA Ao Root diam: 3.50 cm TRICUSPID VALVE TR Peak grad:   12.7 mmHg TR Vmax:        178.00 cm/s  SHUNTS Systemic Diam: 2.85 cm Clotilde Dieter Electronically signed by Clotilde Dieter Signature Date/Time: 04/05/2024/1:47:18 PM    Final    CT Lumbar Spine Wo Contrast Result Date: 04/04/2024 CLINICAL DATA:  78 year old female status post fall.  Pain. EXAM: CT LUMBAR SPINE WITHOUT CONTRAST TECHNIQUE: Multidetector CT imaging of the lumbar spine was performed without intravenous contrast administration. Multiplanar CT image reconstructions were also generated. RADIATION DOSE REDUCTION: This exam was performed according to the departmental dose-optimization program which includes automated exposure control, adjustment of the mA and/or kV according to patient size and/or use of iterative reconstruction technique. COMPARISON:  Thoracic CT today reported separately. Chest CT 05/04/2020. FINDINGS: Segmentation: Normal, concordant with the thoracic numbering today. Alignment: Mild levoconvex lumbar scoliosis. Maintained lumbar  lordosis. Mild degenerative retrolisthesis L1-L2 and L2-L3. Vertebrae: Osteopenia. L1 vertebral height stable since 2021. Maintained other lumbar vertebral body height. Benign hemangioma of the L3 vertebral body (no follow-up imaging recommended). Lumbar vertebrae appear intact. Visible sacrum and SI joints appear intact. Paraspinal and other soft tissues: Negative costophrenic angles. Aortoiliac calcified atherosclerosis. Normal caliber abdominal aorta. Large bowel retained stool. Lumbar paraspinal soft tissues are within normal limits. Disc levels: Chronic vacuum disc at L1-L2. Mild degenerative lumbar spinal stenosis suspected there. And mild to moderate at L2-L3 related to combined disc and posterior element degeneration (sagittal image 32). Mild L3-L4 degeneration also suspected with bulky calcified ligament flavum hypertrophy contributing there. Otherwise L4-L5 and L5-S1 facet arthropathy. IMPRESSION: 1. Osteopenia. No acute traumatic injury identified in the Lumbar Spine. 2. 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 Electronically Signed   By: Odessa Fleming M.D.   On: 04/04/2024 08:40   CT Thoracic Spine Wo Contrast Result Date: 04/04/2024 CLINICAL DATA:  78 year old female status post fall.  Pain. EXAM: CT THORACIC SPINE WITHOUT CONTRAST TECHNIQUE: Multidetector CT images of the thoracic were obtained using the standard protocol without intravenous contrast. RADIATION DOSE REDUCTION: This exam was performed according to the departmental dose-optimization program which includes automated exposure control, adjustment of the mA and/or kV according to patient size and/or use of iterative reconstruction technique. COMPARISON:  Chest CT 05/04/2020. FINDINGS: Limited cervical spine imaging: Cervicothoracic junction alignment is within normal limits. Chronic disc and endplate degeneration at that level. Thoracic spine segmentation:  Normal. Alignment: Stable thoracic kyphosis  since 2021. Mild thoracic scoliosis. No spondylolisthesis. Vertebrae: Chronic osteopenia. Maintained thoracic vertebral height. Chronic interbody ankylosis appears degenerative at T6-T7. No acute thoracic vertebral fracture identified. Visible posterior ribs appear intact. Paraspinal and other soft tissues: Visible central airways, lungs demonstrate stable patency and ventilation. Calcified coronary artery atherosclerosis or stent. Calcified aortic atherosclerosis. No pericardial or pleural effusion is evident. Negative visible noncontrast upper abdominal viscera. Thoracic paraspinal soft tissues are within normal limits. Disc levels: Age-appropriate thoracic spine degeneration does not appear significantly changed from the 2021 CT. Lumbar spine reported separately. IMPRESSION: 1. No acute traumatic injury identified in the Thoracic Spine. 2. Stable CT appearance of the thoracic spine since 2021. 3.  Aortic Atherosclerosis (ICD10-I70.0). Electronically Signed   By: Odessa Fleming M.D.   On: 04/04/2024 08:28   DG Chest Portable 1 View Result Date: 04/04/2024 CLINICAL DATA:  Mechanical fall. EXAM: PORTABLE CHEST 1  VIEW COMPARISON:  04/01/2023. FINDINGS: Bilateral lung fields are clear. Bilateral costophrenic angles are clear. Note is made of elevated right hemidiaphragm. Normal cardio-mediastinal silhouette. No acute osseous abnormalities. The soft tissues are within normal limits. IMPRESSION: No active disease. Electronically Signed   By: Jules Schick M.D.   On: 04/04/2024 08:23   DG Knee 1-2 Views Right Result Date: 04/04/2024 CLINICAL DATA:  Mechanical fall.  Bilateral knee pain. EXAM: RIGHT KNEE - 1-2 VIEW; LEFT KNEE - 1-2 VIEW COMPARISON:  09/20/2012 and 04/21/2011. FINDINGS: No acute fracture or dislocation. No aggressive osseous lesion. Redemonstration of bilateral total knee arthroplasty with patellar resurfacing. The hardware is intact. No periprosthetic fracture or lucency. No interval change in alignment,  when compared to the available prior examination. No knee effusion or focal soft tissue swelling. No radiopaque foreign bodies. IMPRESSION: *No acute osseous abnormality of the bilateral knees. Bilateral total knee arthroplasty without evidence of hardware failure. Electronically Signed   By: Jules Schick M.D.   On: 04/04/2024 08:22   DG Knee 1-2 Views Left Result Date: 04/04/2024 CLINICAL DATA:  Mechanical fall.  Bilateral knee pain. EXAM: RIGHT KNEE - 1-2 VIEW; LEFT KNEE - 1-2 VIEW COMPARISON:  09/20/2012 and 04/21/2011. FINDINGS: No acute fracture or dislocation. No aggressive osseous lesion. Redemonstration of bilateral total knee arthroplasty with patellar resurfacing. The hardware is intact. No periprosthetic fracture or lucency. No interval change in alignment, when compared to the available prior examination. No knee effusion or focal soft tissue swelling. No radiopaque foreign bodies. IMPRESSION: *No acute osseous abnormality of the bilateral knees. Bilateral total knee arthroplasty without evidence of hardware failure. Electronically Signed   By: Jules Schick M.D.   On: 04/04/2024 08:22   DG Pelvis 1-2 Views Result Date: 04/04/2024 CLINICAL DATA:  Mechanical fall. EXAM: PELVIS - 1-2 VIEW COMPARISON:  None Available. FINDINGS: Pelvis is intact with normal and symmetric sacroiliac joints. No acute fracture or dislocation. No aggressive osseous lesion. Visualized sacral arcuate lines are unremarkable. There are changes of chronic pubic symphisitis. There are moderate to severe degenerative changes of bilateral hip joints (right > left), in the form of reduced joint space, subchondral sclerosis / cystic changes and osteophytosis. No radiopaque foreign bodies. IMPRESSION: *No acute osseous abnormality of the pelvis. Electronically Signed   By: Jules Schick M.D.   On: 04/04/2024 08:19   CT Head Wo Contrast Result Date: 04/04/2024 CLINICAL DATA:  Mental status change.  Syncope.  No trauma. EXAM: CT  HEAD WITHOUT CONTRAST TECHNIQUE: Contiguous axial images were obtained from the base of the skull through the vertex without intravenous contrast. RADIATION DOSE REDUCTION: This exam was performed according to the departmental dose-optimization program which includes automated exposure control, adjustment of the mA and/or kV according to patient size and/or use of iterative reconstruction technique. COMPARISON:  Brain MRI from 05/15/2014 FINDINGS: Brain: No evidence of acute infarction, hemorrhage, hydrocephalus, extra-axial collection or mass lesion/mass effect. There is mild diffuse low-attenuation within the subcortical and periventricular white matter compatible with chronic microvascular disease. Vascular: No hyperdense vessel or unexpected calcification. Skull: Normal. Negative for fracture or focal lesion. Sinuses/Orbits: There is mild mucosal thickening involving the left maxillary sinus and sphenoid sinus. Other: None. IMPRESSION: 1. No acute intracranial abnormalities. 2. Chronic microvascular disease. 3. Mild paranasal sinus disease. Electronically Signed   By: Signa Kell M.D.   On: 04/04/2024 07:30    ECHO as above  TELEMETRY reviewed by me 04/07/2024: Sinus rhythm rate 70s  EKG reviewed by me: sinus  rhythm rate 69 bpm with PACs.  Data reviewed by me 04/07/2024: last 24h vitals tele labs imaging I/O ED provider note, admission H&P, hospitalist progress note  Principal Problem:   Syncope    ASSESSMENT AND PLAN:  Hailey Hawkins is a 78 y.o. female  with a past medical history of paroxysmal atrial fibrillation, mild aortic stenosis, hypertension, hyperlipidemia, type 2 diabetes mellitus who presented to the ED on 04/04/2024 for syncope. Cardiology was consulted for further evaluation.   # Syncopal Episode  # Paroxsymal atrial fibrillation # Hypertension # Urinary tract infection, E. coli Patient presented to the ED after having a syncopal episode with prodrome of dizziness and  diarrhea for the past 2 days. Patient has history of paroxysmal atrial fibrillation however has been in sinus rhythm since admissions with controlled heart rates.  Echo this admission with EF 60 to 65% with no regional wall motion abnormality.  She has been hypertensive with normal heart rates.  -Continue Eliquis 5 mg twice daily for stroke risk reduction. -Resume hydralazine at reduced dose of 25 mg 3 times daily.  Continue home benazepril 40 mg daily, clonidine 0.1 mg twice daily.  -Continue Imdur 30 mg twice daily -Continue metoprolol succinate 25 mg daily. -Continue atorvastatin 10 mg daily -Encouraged patient to walk around the unit and to stay hydrated. -Will place monitor today for further evaluation on discharge.  Ok for discharge today from a cardiac perspective. Will arrange for follow up in clinic with Dr. Corky Sing in 1-2 weeks.    This patient's plan of care was discussed and created with Dr. Corky Sing and he is in agreement.  Signed: Gale Journey, PA-C  04/07/2024, 9:53 AM [facility] Cardiology

## 2024-04-07 NOTE — Progress Notes (Signed)
 Physical Therapy Treatment Patient Details Name: Hailey Hawkins MRN: [id number] DOB: November 21, 1946 Today's Date: 04/07/2024   History of Present Illness Pt is a 78 y.o. female presenting to hospital 04/04/24 with c/o syncope.  Pt admitted with syncope and fall, PAF.  PMH includes PAF on Eliquis, refractory htn, mild aortic stenosis, R breast CA stage Ia s/p chemo/immunotherapy/lumpectomy, IIDDM, HLD, asthma/COPD, B TKR.    PT Comments  Pt received in Semi-Fowler's position and agreeable to therapy.  Pt performing well with bed transfer, however noted some instances of dizziness with sitting position.  Pt then stood x4-5 attempts, however still having residual dizziness.  Pt noted to not have any orthostatic complications, but with vestibular and eye assessment noted to have dizziness with the VOR x1 as well.  MD notified of continued dizziness as pt was expected to d/c later today.  May need formal vestibular assessment to rule out BPPV or central problem contributing to the dizziness.      If plan is discharge home, recommend the following: A little help with walking and/or transfers;A little help with bathing/dressing/bathroom;Assistance with cooking/housework;Assist for transportation;Help with stairs or ramp for entrance   Can travel by private vehicle        Equipment Recommendations  Rolling walker (2 wheels);Other (comment);Wheelchair (measurements PT);Wheelchair cushion (measurements PT)    Recommendations for Other Services       Precautions / Restrictions Precautions Precautions: Fall Restrictions Weight Bearing Restrictions Per Provider Order: No     Mobility  Bed Mobility Overal bed mobility: Needs Assistance Bed Mobility: Supine to Sit, Sit to Supine     Supine to sit: Supervision Sit to supine: Supervision   General bed mobility comments: increased time, no physical assistance required    Transfers Overall transfer level: Needs assistance Equipment used: Rolling  walker (2 wheels) Transfers: Sit to/from Stand Sit to Stand: Contact guard assist Stand pivot transfers: Contact guard assist, Min assist         General transfer comment: cues for hand placement and anterior weight shifting    Ambulation/Gait Ambulation/Gait assistance: Contact guard assist Gait Distance (Feet): 15 Feet Assistive device: Rolling walker (2 wheels) Gait Pattern/deviations: Step-through pattern Gait velocity: decreased     General Gait Details: pt able to ambulated to the bathroom and back, but still noting dizziness.   Stairs             Wheelchair Mobility     Tilt Bed    Modified Rankin (Stroke Patients Only)       Balance Overall balance assessment: Needs assistance Sitting-balance support: No upper extremity supported, Feet supported Sitting balance-Leahy Scale: Good     Standing balance support: Bilateral upper extremity supported, Reliant on assistive device for balance Standing balance-Leahy Scale: Fair Standing balance comment: no UE support briefly for hand washing with no loss of balance                            Communication Communication Communication: No apparent difficulties  Cognition Arousal: Alert Behavior During Therapy: WFL for tasks assessed/performed                             Following commands: Impaired Following commands impaired: Follows multi-step commands with increased time    Cueing Cueing Techniques: Verbal cues  Exercises      General Comments        Pertinent Vitals/Pain Pain Assessment  Pain Assessment: No/denies pain    Home Living                          Prior Function            PT Goals (current goals can now be found in the care plan section) Acute Rehab PT Goals Patient Stated Goal: to improve dizziness PT Goal Formulation: With patient Time For Goal Achievement: 04/18/24 Potential to Achieve Goals: Good Progress towards PT goals: Progressing  toward goals    Frequency    Min 3X/week      PT Plan      Co-evaluation   Reason for Co-Treatment: Complexity of the patient's impairments (multi-system involvement);For patient/therapist safety   OT goals addressed during session: ADL's and self-care;Proper use of Adaptive equipment and DME      AM-PAC PT "6 Clicks" Mobility   Outcome Measure  Help needed turning from your back to your side while in a flat bed without using bedrails?: None Help needed moving from lying on your back to sitting on the side of a flat bed without using bedrails?: A Little Help needed moving to and from a bed to a chair (including a wheelchair)?: A Little Help needed standing up from a chair using your arms (e.g., wheelchair or bedside chair)?: A Little Help needed to walk in hospital room?: A Little Help needed climbing 3-5 steps with a railing? : A Lot 6 Click Score: 18    End of Session Equipment Utilized During Treatment: Gait belt Activity Tolerance: Patient tolerated treatment well Patient left: in bed;with call bell/phone within reach;with family/visitor present;with nursing/sitter in room Nurse Communication: Mobility status (nurse present during session) PT Visit Diagnosis: Unsteadiness on feet (R26.81);Other abnormalities of gait and mobility (R26.89);Muscle weakness (generalized) (M62.81);History of falling (Z91.81);Pain     Time: 1003-1035 PT Time Calculation (min) (ACUTE ONLY): 32 min  Charges:    $Therapeutic Activity: 8-22 mins PT General Charges $$ ACUTE PT VISIT: 1 Visit                     Nolon Bussing, PT, DPT Physical Therapist - [facility]  04/07/24, 12:47 PM

## 2024-04-07 NOTE — TOC Progression Note (Signed)
 Transition of Care [facility]) - Progression Note    Patient Details  Name: Hailey Hawkins MRN: [id number] Date of Birth: July 01, 1946  Transition of Care [facility] CM/SW Contact  Truddie Hidden, RN Phone Number: 04/07/2024, 1:09 PM  Clinical Narrative:    Spoke with patient regarding therapy's recommendation for [facility] PT/OT. She was previously provided a list of HH agencies. She stated she is unable to read the list without her glasses. RNCM reviewed choices of agencies for [facility], [place], [place], [facility], and [address]. Patient does not have a preference. Patient advised a referral will be sent to an agency and they will contact her within 48 of discharge to scheduled her SOC. Patient is agreeable to receive a WC. She refused RW. She is requesting WC be delivered to her home.   Request for [facility] sent to [facility] from [facility].   Referral for [facility] PT/OT sent to and accepted by Velna Hatchet from [facility].    Expected Discharge Plan: Home w Home Health Services Barriers to Discharge: Continued Medical Work up  Expected Discharge Plan and Services     Post Acute Care Choice: Home Health Living arrangements for the past 2 months: Single Family Home                           HH Arranged: PT, OT           Social Determinants of Health (SDOH) Interventions SDOH Screenings   Food Insecurity: No Food Insecurity (04/05/2024)  Housing: Low Risk  (04/05/2024)  Transportation Needs: No Transportation Needs (04/05/2024)  Utilities: Not At Risk (04/05/2024)  Financial Resource Strain: Low Risk  (08/28/2023)   Received from [facility]  Social Connections: Moderately Integrated (04/05/2024)  Tobacco Use: Low Risk  (04/04/2024)    Readmission Risk Interventions     No data to display

## 2024-04-07 NOTE — Inpatient Diabetes Management (Signed)
 Inpatient Diabetes Program Recommendations  AACE/ADA: New Consensus Statement on Inpatient Glycemic Control (2015)  Target Ranges:  Prepandial:   less than 140 mg/dL      Peak postprandial:   less than 180 mg/dL (1-2 hours)      Critically ill patients:  140 - 180 mg/dL   Lab Results  Component Value Date   GLUCAP 304 (H) 04/07/2024   HGBA1C 7.4 (H) 04/04/2024    Review of Glycemic Control  Latest Reference Range & Units 04/06/24 16:42 04/06/24 21:41 04/07/24 05:19 04/07/24 07:58 04/07/24 12:49  Glucose-Capillary 70 - 99 mg/dL 696 (H) 295 (H) 284 (H) 241 (H) 304 (H)   Diabetes history: DM2 Outpatient Diabetes medications: Lantus 20 units at bedtime, Jardiance 10 mg daily, Metformin 1000 mg BID Current orders for Inpatient glycemic control: Semglee 15 units Q24H, Novolog 0-20 units TID with meals, Novolog 0-5 units QHS   Inpatient Diabetes Program Recommendations:    May consider increasing Semglee to 20 units daily. Also consider reducing Novolog to moderate (0-15 units) tid with meals and add Novolog meal coverage 3 units tid with meals (to be held if patient NPO or if they eat less than 50% ).    Thanks,  Lorenza Cambridge, RN, BC-ADM Inpatient Diabetes Coordinator Pager [phone]  (8a-5p)

## 2024-04-08 ENCOUNTER — Other Ambulatory Visit: Payer: Self-pay

## 2024-04-08 DIAGNOSIS — R55 Syncope and collapse: Secondary | ICD-10-CM | POA: Diagnosis not present

## 2024-04-08 DIAGNOSIS — N390 Urinary tract infection, site not specified: Secondary | ICD-10-CM | POA: Diagnosis present

## 2024-04-08 LAB — GLUCOSE, CAPILLARY
Glucose-Capillary: 243 mg/dL — ABNORMAL HIGH (ref 70–99)
Glucose-Capillary: 303 mg/dL — ABNORMAL HIGH (ref 70–99)
Glucose-Capillary: 247 mg/dL — ABNORMAL HIGH (ref 70–99)

## 2024-04-08 MED ORDER — INSULIN ASPART 100 UNIT/ML IJ SOLN
3.0000 [IU] | Freq: Three times a day (TID) | INTRAMUSCULAR | Status: DC
Start: 2024-04-08 — End: 2024-04-08
  Administered 2024-04-08: 3 [IU] via SUBCUTANEOUS
  Filled 2024-04-08: qty 1

## 2024-04-08 MED ORDER — CLONIDINE HCL 0.2 MG PO TABS
0.1000 mg | ORAL_TABLET | Freq: Two times a day (BID) | ORAL | 0 refills | Status: DC
  Filled 2024-04-08: qty 30, 30d supply, fill #0

## 2024-04-08 MED ORDER — CEPHALEXIN 500 MG PO CAPS
500.0000 mg | ORAL_CAPSULE | Freq: Two times a day (BID) | ORAL | 0 refills | Status: AC
  Filled 2024-04-08: qty 4, 2d supply, fill #0

## 2024-04-08 NOTE — Progress Notes (Signed)
 Occupational Therapy Treatment Patient Details Name: Hailey Hawkins MRN: [id number] DOB: December 21, 1946 Today's Date: 04/08/2024   History of present illness Pt is a 78 y.o. female presenting to hospital 04/04/24 with c/o syncope.  Pt admitted with syncope and fall, PAF.  PMH includes PAF on Eliquis, refractory htn, mild aortic stenosis, R breast CA stage Ia s/p chemo/immunotherapy/lumpectomy, IIDDM, HLD, asthma/COPD, B TKR.   OT comments  Chart reviewed to date, pt in hallway with PT amb with RW when she required seated rest break due to dizziness. +2 with chair follow. SET UP required for grooming tasks. Pt requested to return back to bed, education provided to pt re: safe ADL completion with DME. OT will continue to follow.       If plan is discharge home, recommend the following:  A little help with walking and/or transfers;A lot of help with bathing/dressing/bathroom;A little help with bathing/dressing/bathroom;Assistance with cooking/housework;Assist for transportation;Help with stairs or ramp for entrance   Equipment Recommendations  BSC/3in1;Tub/shower seat    Recommendations for Other Services      Precautions / Restrictions Precautions Precautions: Fall Recall of Precautions/Restrictions: Intact Restrictions Weight Bearing Restrictions Per Provider Order: No       Mobility Bed Mobility Overal bed mobility: Needs Assistance Bed Mobility: Supine to Sit, Sit to Supine       Sit to supine: Supervision, HOB elevated        Transfers Overall transfer level: Needs assistance Equipment used: Rolling walker (2 wheels) Transfers: Sit to/from Stand Sit to Stand: Contact guard assist, Min assist                 Balance Overall balance assessment: Needs assistance Sitting-balance support: No upper extremity supported, Feet supported Sitting balance-Leahy Scale: Good     Standing balance support: Bilateral upper extremity supported, Reliant on assistive device for  balance Standing balance-Leahy Scale: Fair                             ADL either performed or assessed with clinical judgement   ADL Overall ADL's : Needs assistance/impaired                                       General ADL Comments: SET UP for grooming tasks, +2 for chair follow for safety with amb with RW in hallway after pt dizzy with PT    Extremity/Trunk Assessment              Vision       Perception     Praxis     Communication Communication Communication: No apparent difficulties   Cognition Arousal: Alert Behavior During Therapy: WFL for tasks assessed/performed Cognition: No apparent impairments                               Following commands: Impaired Following commands impaired: Follows multi-step commands with increased time      Cueing   Cueing Techniques: Verbal cues  Exercises Other Exercises Other Exercises: edu re: role of rehab, safe ADL completion with DME    Shoulder Instructions       General Comments      Pertinent Vitals/ Pain       Pain Assessment Pain Assessment: No/denies pain  Home Living  Prior Functioning/Environment              Frequency  Min 2X/week        Progress Toward Goals  OT Goals(current goals can now be found in the care plan section)  Progress towards OT goals: Progressing toward goals  Acute Rehab OT Goals Time For Goal Achievement: 04/18/24  Plan      Co-evaluation    PT/OT/SLP Co-Evaluation/Treatment: Yes Reason for Co-Treatment: Complexity of the patient's impairments (multi-system involvement);For patient/therapist safety   OT goals addressed during session: ADL's and self-care;Proper use of Adaptive equipment and DME      AM-PAC OT "6 Clicks" Daily Activity     Outcome Measure   Help from another person eating meals?: None Help from another person taking care of personal  grooming?: A Little Help from another person toileting, which includes using toliet, bedpan, or urinal?: A Little Help from another person bathing (including washing, rinsing, drying)?: A Lot Help from another person to put on and taking off regular upper body clothing?: A Little Help from another person to put on and taking off regular lower body clothing?: A Lot 6 Click Score: 17    End of Session Equipment Utilized During Treatment: Rolling walker (2 wheels);Gait belt  OT Visit Diagnosis: Other abnormalities of gait and mobility (R26.89);Muscle weakness (generalized) (M62.81);Dizziness and giddiness (R42)   Activity Tolerance Patient tolerated treatment well   Patient Left in bed;with call bell/phone within reach   Nurse Communication Mobility status        Time: 1610-9604 OT Time Calculation (min): 13 min  Charges: OT General Charges $OT Visit: 1 Visit OT Treatments $Therapeutic Activity: 8-22 mins  Oleta Mouse, OTD OTR/L  04/08/24, 1:18 PM

## 2024-04-08 NOTE — Progress Notes (Signed)
 Physical Therapy Treatment Patient Details Name: Hailey Hawkins MRN: [id number] DOB: Jul 08, 1946 Today's Date: 04/08/2024   History of Present Illness Pt is a 78 y.o. female presenting to hospital 04/04/24 with c/o syncope.  Pt admitted with syncope and fall, PAF.  PMH includes PAF on Eliquis, refractory htn, mild aortic stenosis, R breast CA stage Ia s/p chemo/immunotherapy/lumpectomy, IIDDM, HLD, asthma/COPD, B TKR.    PT Comments  Pt received seated in recliner upon arrival to room and pt agreeable to therapy.  Pt noting some increased pain in the buttocks from sitting in the chair for several hours this morning.  Pt was able to transfer well and ambulate to the hallway without much dizziness.  Pt made it ~ 120' and started getting dizzy and required a quick seated rest break and a rolling chair.  Pt vitals were assessed and were [facility].  Pt would still be best served receiving HHPT and a potential follow-up with a vestibular therapist for more formal work-up.  Pt then returned to the room and was left with all needs met in the bed with lunch.     If plan is discharge home, recommend the following: A little help with walking and/or transfers;A little help with bathing/dressing/bathroom;Assistance with cooking/housework;Assist for transportation;Help with stairs or ramp for entrance   Can travel by private vehicle        Equipment Recommendations  Rolling walker (2 wheels);Other (comment);Wheelchair (measurements PT);Wheelchair cushion (measurements PT)    Recommendations for Other Services       Precautions / Restrictions Precautions Precautions: Fall Recall of Precautions/Restrictions: Intact Restrictions Weight Bearing Restrictions Per Provider Order: No     Mobility  Bed Mobility Overal bed mobility: Needs Assistance Bed Mobility: Supine to Sit, Sit to Supine       Sit to supine: Supervision, HOB elevated        Transfers Overall transfer level: Needs  assistance Equipment used: Rolling walker (2 wheels) Transfers: Sit to/from Stand Sit to Stand: Contact guard assist, Min assist                Ambulation/Gait Ambulation/Gait assistance: Contact guard assist Gait Distance (Feet): 160 Feet Assistive device: Rolling walker (2 wheels) Gait Pattern/deviations: Step-through pattern Gait velocity: decreased     General Gait Details: one seated reast break necessary due to increased dizziness.   Stairs             Wheelchair Mobility     Tilt Bed    Modified Rankin (Stroke Patients Only)       Balance Overall balance assessment: Needs assistance Sitting-balance support: No upper extremity supported, Feet supported Sitting balance-Leahy Scale: Good     Standing balance support: Bilateral upper extremity supported, Reliant on assistive device for balance Standing balance-Leahy Scale: Fair                              Hotel manager: No apparent difficulties  Cognition Arousal: Alert Behavior During Therapy: WFL for tasks assessed/performed                             Following commands: Impaired Following commands impaired: Follows multi-step commands with increased time    Cueing Cueing Techniques: Verbal cues  Exercises      General Comments        Pertinent Vitals/Pain Pain Assessment Pain Assessment: No/denies pain    Home Living  Prior Function            PT Goals (current goals can now be found in the care plan section) Acute Rehab PT Goals Patient Stated Goal: to improve dizziness PT Goal Formulation: With patient Time For Goal Achievement: 04/18/24 Potential to Achieve Goals: Good Progress towards PT goals: Progressing toward goals    Frequency    Min 3X/week      PT Plan      Co-evaluation   Reason for Co-Treatment: Complexity of the patient's impairments (multi-system  involvement);For patient/therapist safety   OT goals addressed during session: ADL's and self-care;Proper use of Adaptive equipment and DME      AM-PAC PT "6 Clicks" Mobility   Outcome Measure  Help needed turning from your back to your side while in a flat bed without using bedrails?: None Help needed moving from lying on your back to sitting on the side of a flat bed without using bedrails?: A Little Help needed moving to and from a bed to a chair (including a wheelchair)?: A Little Help needed standing up from a chair using your arms (e.g., wheelchair or bedside chair)?: A Little Help needed to walk in hospital room?: A Little Help needed climbing 3-5 steps with a railing? : A Lot 6 Click Score: 18    End of Session Equipment Utilized During Treatment: Gait belt Activity Tolerance: Patient tolerated treatment well Patient left: in bed;with call bell/phone within reach;with family/visitor present;with nursing/sitter in room Nurse Communication: Mobility status (nurse present during session) PT Visit Diagnosis: Unsteadiness on feet (R26.81);Other abnormalities of gait and mobility (R26.89);Muscle weakness (generalized) (M62.81);History of falling (Z91.81);Pain     Time: 1121-1150 PT Time Calculation (min) (ACUTE ONLY): 29 min  Charges:    $Therapeutic Activity: 23-37 mins PT General Charges $$ ACUTE PT VISIT: 1 Visit                     Nolon Bussing, PT, DPT Physical Therapist - [facility]  04/08/24, 1:36 PM

## 2024-04-08 NOTE — TOC Transition Note (Addendum)
 Transition of Care [facility]) - Discharge Note   Patient Details  Name: Jessee Newnam MRN: [id number] Date of Birth: January 31, 1946  Transition [facility] Care [facility]) CM/SW Contact:  Truddie Hidden, RN Phone Number: 04/08/2024, 3:41 PM   Clinical Narrative:    Spoke with patient's son to advised [facility] SOC would be Wednesday, and to advised WC would be delivered today. Patient spouse will transport her home.   TOC signing off.       Barriers to Discharge: Continued Medical Work up   Patient Goals and CMS Choice   CMS Medicare.gov Compare Post Acute Care list provided to:: Patient Choice offered to / list presented to : Patient      Discharge Placement                       Discharge Plan and Services Additional resources added to the After Visit Summary for       Post Acute Care Choice: Home Health                    HH Arranged: PT, OT          Social Drivers of Health (SDOH) Interventions SDOH Screenings   Food Insecurity: No Food Insecurity (04/05/2024)  Housing: Low Risk  (04/05/2024)  Transportation Needs: No Transportation Needs (04/05/2024)  Utilities: Not At Risk (04/05/2024)  Financial Resource Strain: Low Risk  (08/28/2023)   Received from [facility]  Social Connections: Moderately Integrated (04/05/2024)  Tobacco Use: Low Risk  (04/04/2024)     Readmission Risk Interventions     No data to display

## 2024-04-08 NOTE — Plan of Care (Signed)
  Problem: Clinical Measurements: Goal: Ability to maintain clinical measurements within normal limits will improve Outcome: Progressing Goal: Will remain free from infection Outcome: Progressing Goal: Respiratory complications will improve Outcome: Progressing Goal: Cardiovascular complication will be avoided Outcome: Progressing   Problem: Safety: Goal: Ability to remain free from injury will improve Outcome: Progressing

## 2024-04-08 NOTE — Inpatient Diabetes Management (Signed)
 Inpatient Diabetes Program Recommendations  AACE/ADA: New Consensus Statement on Inpatient Glycemic Control (2015)  Target Ranges:  Prepandial:   less than 140 mg/dL      Peak postprandial:   less than 180 mg/dL (1-2 hours)      Critically ill patients:  140 - 180 mg/dL   Lab Results  Component Value Date   GLUCAP 303 (H) 04/08/2024   HGBA1C 7.4 (H) 04/04/2024    Review of Glycemic Control  Latest Reference Range & Units 04/07/24 07:58 04/07/24 12:49 04/07/24 17:19 04/07/24 21:34 04/08/24 04:55 04/08/24 09:29  Glucose-Capillary 70 - 99 mg/dL 960 (H) 454 (H) 70 098 (H) 243 (H) 303 (H)   Diabetes history: DM  Outpatient Diabetes medications:  Lantus 20 units at bedtime, Jardiance 10 mg daily, Metformin 1000 mg BID  Current orders for Inpatient glycemic control:  Novolog 0-15 units tid with meals  Novolog 3 units tid with meals (just added) Semglee 20 units daily  Inpatient Diabetes Program Recommendations:    Consider increasing Semglee to 25 units daily.   Thanks,  Lorenza Cambridge, RN, BC-ADM Inpatient Diabetes Coordinator Pager [phone]  (8a-5p)

## 2024-04-08 NOTE — Discharge Summary (Addendum)
 Physician Discharge Summary   Patient: Hailey Hawkins MRN: [id number] DOB: October 08, 1946  Admit date:     04/04/2024  Discharge date: 04/08/24  Discharge Physician: Jourdyn Ferrin   PCP: Marguarite Arbour, MD   Recommendations at discharge:   Ambulate with assist device Keep follow-up appointment with cardiology and primary care provider  Discharge Diagnoses: Principal Problem:   Syncope Active Problems:   UTI (urinary tract infection)   Acquired hypothyroidism   Benign essential hypertension   Chronic hyponatremia   Type 2 diabetes mellitus without complication, with long-term current use of insulin (HCC)  Resolved Problems:   * No resolved hospital problems. *  Hospital Course:  Hailey Hawkins is a 78 y.o. female with medical history significant of PAF on Eliquis, refractory HTN, mild aortic stenosis, breast cancer stage Ia status post chemo/immunotherapy, IDDM, HLD, hypothyroidism, asthma/COPD, presented with syncope.   Patient woke up in the middle night and went to bathroom and passed out.  Found out by husband who heard a loud thump and went to see what happened and found patient on the floor.  Estimated episode less than 1 minute.  Patient recovered consciousness and no postictal confusion denied any tongue biting loss of urine or bowel movement.  She does not remember that she had any prodrome such as lightheadedness palpitation shortness of breath before the episode.  She only complains about bilateral knee pain which is chronic.  She does have symptoms of occasional lightheadedness and exertional dyspnea in recent month.  She can tolerate 5 to 10 minutes walk before start to feel shortness of breath.  No chest pains no palpitation no nauseous vomiting.  She has been eating and drinking fine and no diarrhea.   ED Course: Afebrile, bradycardia heart rate 62 blood pressure 91/60.  Sinus rhythm on telemonitoring.  Blood work showed K4.1, glucose 352, BUN 24 creatinine 2.9,  hemoglobin 11.9 WBC 7.5.  Chest x-ray appears to show mild pulmonary congestion but no acute infiltrates.    Assessment and Plan:   Syncope and collapse Appears to be vasovagal and also thought to be related to patient's known history of aortic stenosis Patient  had orthostatic blood pressure changes that have resolved, Supine BP 140/77 (MAP 94) with HR 83 bpm; sitting BP 134/93 (MAP 106) with HR 94 bpm; standing BP (at 0 minutes) 102/84 (MAP 91) with HR 82 bpm.  Given history of AS, and other symptoms of recent decompensation such as exertional dyspnea and lightheadedness, suspect progression of aortic stenosis.  2D echocardiogram was done and it showed an LVEF of 60 to 65% with normal LV systolic function.  Normal aortic valve, aortic valve sclerosis/calcification is present without evidence of stenosis Appreciate cardiology input Follow-up with cardiology as an outpatient       PAF Continue Eliquis Continue propranolol for rate control     HTN Patient on several antihypertensive medications including clonidine, benazepril, hydralazine and Imdur Patient will be discharged home on clonidine 0.1 mg twice daily, continue Imdur and propranolol Hydralazine and benazepril remain on hold and may be resumed by her primary care provider       Urinary tract infection Patient with significant pyuria Urine culture yielded E. Coli Patient received IV antibiotics with Rocephin during her hospitalization and will be discharged home on Keflex         Episode of agitation Acute metabolic encephalopathy Concerning for sundowning versus metabolic encephalopathy Most likely metabolic encephalopathy from UTI      Dizziness ??  Vestibular dysfunction No witnessed nystagmus Trial of meclizine Continue PT Improved but not completely resolved Patient has a wheelchair to use on discharge       Hypokalemia Supplement potassium       Diabetes mellitus with hyperglycemia Continue  Semglee, metformin and Jardiance Continue consistent carbohydrate diet    Patient was seen and examined at the bedside prior to her discharge.  She was able to ambulate in the hall with physical therapy with minimal dizziness at the point of completion.  She will be discharged home and has been advised to use an assistive device       Consultants: Cardiology Procedures performed: Echocardiogram Disposition: Home health Diet recommendation:  Discharge Diet Orders (From admission, onward)     Start     Ordered   04/08/24 0000  Diet - low sodium heart healthy        04/08/24 1320   04/08/24 0000  Diet Carb Modified        04/08/24 1320           Cardiac and Carb modified diet DISCHARGE MEDICATION: Allergies as of 04/08/2024       Reactions   Methamphetamine Shortness Of Breath   Difficulty breathing (INHALER)   Amoxicillin    Other reaction(s): Diarrhea and vomiting (finding)   Antihistamines, Chlorpheniramine-type    Other reaction(s): Other (See Comments) Severe dryness   Ciprofloxacin    Other reaction(s): Distress (finding)   Codeine    Other reaction(s): Diarrhea and vomiting (finding), Weal or any derivitive   Other Other (See Comments)   Birth control pills - blood clots Birth control pills - blood clots   Oxycodone-acetaminophen Nausea And Vomiting   Penicillin G    Other reaction(s): Weal   Meloxicam Palpitations        Medication List     STOP taking these medications    benazepril 40 MG tablet Commonly known as: LOTENSIN   hydrALAZINE 50 MG tablet Commonly known as: APRESOLINE       TAKE these medications    acetaminophen 500 MG tablet Commonly known as: TYLENOL Take 1,000 mg by mouth every 6 (six) hours as needed for mild pain.   albuterol 108 (90 Base) MCG/ACT inhaler Commonly known as: VENTOLIN HFA Inhale 2 puffs into the lungs every 4 (four) hours as needed for wheezing or shortness of breath.   ALPRAZolam 0.5 MG  tablet Commonly known as: XANAX Take 0.5 mg by mouth at bedtime as needed for sleep.   apixaban 5 MG Tabs tablet Commonly known as: ELIQUIS Take 5 mg by mouth 2 (two) times daily.   atorvastatin 10 MG tablet Commonly known as: LIPITOR Take 10 mg by mouth daily.   cephALEXin 500 MG capsule Commonly known as: KEFLEX Take 1 capsule (500 mg total) by mouth 2 (two) times daily for 2 days.   cloNIDine 0.2 MG tablet Commonly known as: CATAPRES Take 0.5 tablets (0.1 mg total) by mouth 2 (two) times daily. What changed:  how much to take when to take this   desvenlafaxine 25 MG 24 hr tablet Commonly known as: PRISTIQ Take 25 mg by mouth daily.   empagliflozin 25 MG Tabs tablet Commonly known as: JARDIANCE Take 25 mg by mouth daily.   FREESTYLE LITE test strip Generic drug: glucose blood Use 2 (two) times daily   insulin glargine 100 UNIT/ML injection Commonly known as: LANTUS Inject 20 Units into the skin at bedtime.   INSULIN SYRINGE .5CC/31GX5/16" 31G X 5/16" 0.5  ML Misc Use 1 syringe daily for insulin injections   isosorbide mononitrate 30 MG 24 hr tablet Commonly known as: IMDUR Take 30 mg by mouth 2 (two) times daily.   levothyroxine 75 MCG tablet Commonly known as: SYNTHROID Take 75 mcg by mouth daily before breakfast.   metFORMIN 1000 MG tablet Commonly known as: GLUCOPHAGE Take 1,000 mg by mouth 2 (two) times daily with a meal.   multivitamin tablet Take 1 tablet by mouth daily.   mupirocin ointment 2 % Commonly known as: BACTROBAN Apply 1 Application topically 3 (three) times daily.   NovoFine 30G X 8 MM Misc Generic drug: Insulin Pen Needle as directed To inject insulin.   omeprazole 20 MG capsule Commonly known as: PRILOSEC Take 20 mg by mouth 2 (two) times daily before a meal.   OXYGEN Inhale 2 L into the lungs at bedtime.   propranolol 20 MG tablet Commonly known as: INDERAL Take 1 tablet by mouth 2 (two) times daily.   QUEtiapine 50 MG  tablet Commonly known as: SEROQUEL Take 50 mg by mouth at bedtime.               Durable Medical Equipment  (From admission, onward)           Start     Ordered   04/07/24 1335  DME standard manual wheelchair with seat cushion  Once       Comments: Patient suffers from dizziness which impairs their ability to perform daily activities like bathing in the home.  A walker will not resolve issue with performing activities of daily living. A wheelchair will allow patient to safely perform daily activities. Patient can safely propel the wheelchair in the home or has a caregiver who can provide assistance. Length of need Lifetime. Accessories: elevating leg rests (ELRs), wheel locks, extensions and anti-tippers.   04/07/24 1336            Follow-up Information     Alluri, Meryl Dare, MD. Go in 1 week(s).   Specialty: Cardiology Contact information: [address] [place] [address] [phone]         Marguarite Arbour, MD Follow up in 1 week(s).   Specialty: Internal Medicine Contact information: [address] [place] [address] [phone]                Discharge Exam: Ceasar Mons Weights   04/06/24 0016 04/07/24 0500 04/08/24 0514  Weight: 73.5 kg 74.9 kg 76 kg   Eyes: PERRL, lids and conjunctivae normal ENMT: Mucous membranes are moist. Posterior pharynx clear of any exudate or lesions.Normal dentition.  Neck: normal, supple, no masses, no thyromegaly Respiratory: clear to auscultation bilaterally, no wheezing, no crackles. Normal respiratory effort. No accessory muscle use.  Cardiovascular: Regular rate and rhythm, No extremity edema. 2+ pedal pulses. No carotid bruits.  Abdomen: no tenderness, no masses palpated. No hepatosplenomegaly. Bowel sounds positive.  Musculoskeletal: no clubbing / cyanosis. No joint deformity upper and lower extremities. Good ROM, no contractures. Normal muscle tone.  Skin: no rashes,  lesions, ulcers. No induration Neurologic: CN 2-12 grossly intact. Sensation intact, DTR normal. Strength 5/5 in all 4.  Psychiatric: Normal judgment and insight. Alert and oriented x 3. Normal mood.    Condition at discharge: stable  The results of significant diagnostics from this hospitalization (including imaging, microbiology, ancillary and laboratory) are listed below for reference.   Imaging Studies: ECHOCARDIOGRAM COMPLETE Result Date: 04/05/2024    ECHOCARDIOGRAM REPORT  Patient Name:   AYMAR WHITFILL [facility] Date of Exam: 04/04/2024 Medical Rec #:  [id number]        Height:       65.0 in Accession #:    [id number]       Weight:       185.0 lb Date of Birth:  12/26/1946       BSA:          1.914 m Patient Age:    89 years         BP:           116/77 mmHg Patient Gender: F                HR:           72 bpm. Exam Location:  [facility] Procedure: 2D Echo, Cardiac Doppler and Color Doppler (Both Spectral and Color            Flow Doppler were utilized during procedure). Indications:     Syncope R55  History:         Patient has no prior history of Echocardiogram examinations.                  Signs/Symptoms:Shortness of Breath; Risk Factors:Hypertension.                  Tachycardia.  Sonographer:     Cristela Blue Referring Phys:  [id number] Emeline General Diagnosing Phys: Clotilde Dieter  Sonographer Comments: Technically challenging study due to limited acoustic windows and no apical window. IMPRESSIONS  1. Left ventricular ejection fraction, by estimation, is 60 to 65%. The left ventricle has normal function. The left ventricle has no regional wall motion abnormalities. Left ventricular diastolic parameters were normal.  2. Right ventricular systolic function is normal. The right ventricular size is normal.  3. The mitral valve is normal in structure. Trivial mitral valve regurgitation. No evidence of mitral stenosis.  4. The aortic valve is normal in structure. Aortic valve regurgitation is mild. Aortic valve  sclerosis/calcification is present, without any evidence of aortic stenosis.  5. The inferior vena cava is normal in size with greater than 50% respiratory variability, suggesting right atrial pressure of 3 mmHg. FINDINGS  Left Ventricle: Left ventricular ejection fraction, by estimation, is 60 to 65%. The left ventricle has normal function. The left ventricle has no regional wall motion abnormalities. The left ventricular internal cavity size was normal in size. There is  no left ventricular hypertrophy. Left ventricular diastolic parameters were normal. Right Ventricle: The right ventricular size is normal. No increase in right ventricular wall thickness. Right ventricular systolic function is normal. Left Atrium: Left atrial size was normal in size. Right Atrium: Right atrial size was normal in size. Pericardium: There is no evidence of pericardial effusion. Mitral Valve: The mitral valve is normal in structure. Trivial mitral valve regurgitation. No evidence of mitral valve stenosis. Tricuspid Valve: The tricuspid valve is normal in structure. Tricuspid valve regurgitation is trivial. Aortic Valve: The aortic valve is normal in structure. Aortic valve regurgitation is mild. Aortic valve sclerosis/calcification is present, without any evidence of aortic stenosis. Pulmonic Valve: The pulmonic valve was normal in structure. Pulmonic valve regurgitation is not visualized. Aorta: The aortic root is normal in size and structure. Venous: The inferior vena cava is normal in size with greater than 50% respiratory variability, suggesting right atrial pressure of 3 mmHg. IAS/Shunts: No atrial level shunt detected by color flow Doppler.  LEFT VENTRICLE PLAX  2D LVIDd:         3.90 cm LVIDs:         2.70 cm LV PW:         1.50 cm LV IVS:        1.40 cm LVOT diam:     2.85 cm LVOT Area:     6.38 cm  LEFT ATRIUM         Index LA diam:    4.50 cm 2.35 cm/m   AORTA Ao Root diam: 3.50 cm TRICUSPID VALVE TR Peak grad:   12.7 mmHg  TR Vmax:        178.00 cm/s  SHUNTS Systemic Diam: 2.85 cm Rozell Searing Custovic Electronically signed by Clotilde Dieter Signature Date/Time: 04/05/2024/1:47:18 PM    Final    CT Lumbar Spine Wo Contrast Result Date: 04/04/2024 CLINICAL DATA:  78 year old female status post fall.  Pain. EXAM: CT LUMBAR SPINE WITHOUT CONTRAST TECHNIQUE: Multidetector CT imaging of the lumbar spine was performed without intravenous contrast administration. Multiplanar CT image reconstructions were also generated. RADIATION DOSE REDUCTION: This exam was performed according to the departmental dose-optimization program which includes automated exposure control, adjustment of the mA and/or kV according to patient size and/or use of iterative reconstruction technique. COMPARISON:  Thoracic CT today reported separately. Chest CT 05/04/2020. FINDINGS: Segmentation: Normal, concordant with the thoracic numbering today. Alignment: Mild levoconvex lumbar scoliosis. Maintained lumbar lordosis. Mild degenerative retrolisthesis L1-L2 and L2-L3. Vertebrae: Osteopenia. L1 vertebral height stable since 2021. Maintained other lumbar vertebral body height. Benign hemangioma of the L3 vertebral body (no follow-up imaging recommended). Lumbar vertebrae appear intact. Visible sacrum and SI joints appear intact. Paraspinal and other soft tissues: Negative costophrenic angles. Aortoiliac calcified atherosclerosis. Normal caliber abdominal aorta. Large bowel retained stool. Lumbar paraspinal soft tissues are within normal limits. Disc levels: Chronic vacuum disc at L1-L2. Mild degenerative lumbar spinal stenosis suspected there. And mild to moderate at L2-L3 related to combined disc and posterior element degeneration (sagittal image 32). Mild L3-L4 degeneration also suspected with bulky calcified ligament flavum hypertrophy contributing there. Otherwise L4-L5 and L5-S1 facet arthropathy. IMPRESSION: 1. Osteopenia. No acute traumatic injury identified in the  Lumbar Spine. 2. 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 Electronically Signed   By: Odessa Fleming M.D.   On: 04/04/2024 08:40   CT Thoracic Spine Wo Contrast Result Date: 04/04/2024 CLINICAL DATA:  78 year old female status post fall.  Pain. EXAM: CT THORACIC SPINE WITHOUT CONTRAST TECHNIQUE: Multidetector CT images of the thoracic were obtained using the standard protocol without intravenous contrast. RADIATION DOSE REDUCTION: This exam was performed according to the departmental dose-optimization program which includes automated exposure control, adjustment of the mA and/or kV according to patient size and/or use of iterative reconstruction technique. COMPARISON:  Chest CT 05/04/2020. FINDINGS: Limited cervical spine imaging: Cervicothoracic junction alignment is within normal limits. Chronic disc and endplate degeneration at that level. Thoracic spine segmentation:  Normal. Alignment: Stable thoracic kyphosis since 2021. Mild thoracic scoliosis. No spondylolisthesis. Vertebrae: Chronic osteopenia. Maintained thoracic vertebral height. Chronic interbody ankylosis appears degenerative at T6-T7. No acute thoracic vertebral fracture identified. Visible posterior ribs appear intact. Paraspinal and other soft tissues: Visible central airways, lungs demonstrate stable patency and ventilation. Calcified coronary artery atherosclerosis or stent. Calcified aortic atherosclerosis. No pericardial or pleural effusion is evident. Negative visible noncontrast upper abdominal viscera. Thoracic paraspinal soft tissues are within normal limits. Disc levels: Age-appropriate thoracic spine degeneration does not appear significantly changed from the 2021 CT.  Lumbar spine reported separately. IMPRESSION: 1. No acute traumatic injury identified in the Thoracic Spine. 2. Stable CT appearance of the thoracic spine since 2021. 3.  Aortic Atherosclerosis (ICD10-I70.0). Electronically Signed   By:  Odessa Fleming M.D.   On: 04/04/2024 08:28   DG Chest Portable 1 View Result Date: 04/04/2024 CLINICAL DATA:  Mechanical fall. EXAM: PORTABLE CHEST 1 VIEW COMPARISON:  04/01/2023. FINDINGS: Bilateral lung fields are clear. Bilateral costophrenic angles are clear. Note is made of elevated right hemidiaphragm. Normal cardio-mediastinal silhouette. No acute osseous abnormalities. The soft tissues are within normal limits. IMPRESSION: No active disease. Electronically Signed   By: Jules Schick M.D.   On: 04/04/2024 08:23   DG Knee 1-2 Views Right Result Date: 04/04/2024 CLINICAL DATA:  Mechanical fall.  Bilateral knee pain. EXAM: RIGHT KNEE - 1-2 VIEW; LEFT KNEE - 1-2 VIEW COMPARISON:  09/20/2012 and 04/21/2011. FINDINGS: No acute fracture or dislocation. No aggressive osseous lesion. Redemonstration of bilateral total knee arthroplasty with patellar resurfacing. The hardware is intact. No periprosthetic fracture or lucency. No interval change in alignment, when compared to the available prior examination. No knee effusion or focal soft tissue swelling. No radiopaque foreign bodies. IMPRESSION: *No acute osseous abnormality of the bilateral knees. Bilateral total knee arthroplasty without evidence of hardware failure. Electronically Signed   By: Jules Schick M.D.   On: 04/04/2024 08:22   DG Knee 1-2 Views Left Result Date: 04/04/2024 CLINICAL DATA:  Mechanical fall.  Bilateral knee pain. EXAM: RIGHT KNEE - 1-2 VIEW; LEFT KNEE - 1-2 VIEW COMPARISON:  09/20/2012 and 04/21/2011. FINDINGS: No acute fracture or dislocation. No aggressive osseous lesion. Redemonstration of bilateral total knee arthroplasty with patellar resurfacing. The hardware is intact. No periprosthetic fracture or lucency. No interval change in alignment, when compared to the available prior examination. No knee effusion or focal soft tissue swelling. No radiopaque foreign bodies. IMPRESSION: *No acute osseous abnormality of the bilateral knees.  Bilateral total knee arthroplasty without evidence of hardware failure. Electronically Signed   By: Jules Schick M.D.   On: 04/04/2024 08:22   DG Pelvis 1-2 Views Result Date: 04/04/2024 CLINICAL DATA:  Mechanical fall. EXAM: PELVIS - 1-2 VIEW COMPARISON:  None Available. FINDINGS: Pelvis is intact with normal and symmetric sacroiliac joints. No acute fracture or dislocation. No aggressive osseous lesion. Visualized sacral arcuate lines are unremarkable. There are changes of chronic pubic symphisitis. There are moderate to severe degenerative changes of bilateral hip joints (right > left), in the form of reduced joint space, subchondral sclerosis / cystic changes and osteophytosis. No radiopaque foreign bodies. IMPRESSION: *No acute osseous abnormality of the pelvis. Electronically Signed   By: Jules Schick M.D.   On: 04/04/2024 08:19   CT Head Wo Contrast Result Date: 04/04/2024 CLINICAL DATA:  Mental status change.  Syncope.  No trauma. EXAM: CT HEAD WITHOUT CONTRAST TECHNIQUE: Contiguous axial images were obtained from the base of the skull through the vertex without intravenous contrast. RADIATION DOSE REDUCTION: This exam was performed according to the departmental dose-optimization program which includes automated exposure control, adjustment of the mA and/or kV according to patient size and/or use of iterative reconstruction technique. COMPARISON:  Brain MRI from 05/15/2014 FINDINGS: Brain: No evidence of acute infarction, hemorrhage, hydrocephalus, extra-axial collection or mass lesion/mass effect. There is mild diffuse low-attenuation within the subcortical and periventricular white matter compatible with chronic microvascular disease. Vascular: No hyperdense vessel or unexpected calcification. Skull: Normal. Negative for fracture or focal lesion. Sinuses/Orbits: There is mild  mucosal thickening involving the left maxillary sinus and sphenoid sinus. Other: None. IMPRESSION: 1. No acute intracranial  abnormalities. 2. Chronic microvascular disease. 3. Mild paranasal sinus disease. Electronically Signed   By: Signa Kell M.D.   On: 04/04/2024 07:30    Microbiology: Results for orders placed or performed during the hospital encounter of 04/04/24  Urine Culture (for pregnant, neutropenic or urologic patients or patients with an indwelling urinary catheter)     Status: Abnormal   Collection Time: 04/05/24  1:40 PM   Specimen: Urine, Clean Catch  Result Value Ref Range Status   Specimen Description   Final    URINE, CLEAN CATCH Performed at [facility], [address] [place], [place] [address]    Special Requests   Final    Normal Performed at [facility], [address] [place], [place] [address]    Culture >=100,000 COLONIES/mL ESCHERICHIA COLI (A)  Final   Report Status 04/07/2024 FINAL  Final   Organism ID, Bacteria ESCHERICHIA COLI (A)  Final      Susceptibility   Escherichia coli - MIC*    AMPICILLIN <=2 SENSITIVE Sensitive     CEFAZOLIN <=4 SENSITIVE Sensitive     CEFEPIME <=0.12 SENSITIVE Sensitive     CEFTRIAXONE <=0.25 SENSITIVE Sensitive     CIPROFLOXACIN >=4 RESISTANT Resistant     GENTAMICIN <=1 SENSITIVE Sensitive     IMIPENEM <=0.25 SENSITIVE Sensitive     NITROFURANTOIN <=16 SENSITIVE Sensitive     TRIMETH/SULFA <=20 SENSITIVE Sensitive     AMPICILLIN/SULBACTAM <=2 SENSITIVE Sensitive     PIP/TAZO <=4 SENSITIVE Sensitive ug/mL    * >=100,000 COLONIES/mL ESCHERICHIA COLI    Labs: CBC: Recent Labs  Lab 04/04/24 0635  WBC 7.5  HGB 11.9*  HCT 35.4*  MCV 87.4  PLT 199   Basic Metabolic Panel: Recent Labs  Lab 04/04/24 0635 04/06/24 0459 04/07/24 0356  NA 135 137 136  K 4.1 3.5 3.3*  CL 101 103 102  CO2 24 24 24   GLUCOSE 352* 169* 229*  BUN 24* 15 25*  CREATININE 0.98 0.58 0.63  CALCIUM 9.0 9.1 9.0  MG 1.9  --   --   PHOS 4.0  --   --    Liver Function Tests: No results for input(s): "AST", "ALT", "ALKPHOS",  "BILITOT", "PROT", "ALBUMIN" in the last 168 hours. CBG: Recent Labs  Lab 04/07/24 1719 04/07/24 2134 04/08/24 0455 04/08/24 0929 04/08/24 1123  GLUCAP 70 280* 243* 303* 247*    Discharge time spent: greater than 30 minutes.  Signed: Lucile Shutters, MD [facility] 04/08/2024

## 2024-04-14 ENCOUNTER — Emergency Department
Admission: EM | Admit: 2024-04-14 | Discharge: 2024-04-14 | Disposition: A | Discharge status: Home / Self Care | Attending: Emergency Medicine | Admitting: Emergency Medicine

## 2024-04-14 ENCOUNTER — Emergency Department

## 2024-04-14 ENCOUNTER — Other Ambulatory Visit: Payer: Self-pay

## 2024-04-14 DIAGNOSIS — R300 Dysuria: Secondary | ICD-10-CM | POA: Diagnosis not present

## 2024-04-14 DIAGNOSIS — R55 Syncope and collapse: Secondary | ICD-10-CM | POA: Diagnosis not present

## 2024-04-14 DIAGNOSIS — R739 Hyperglycemia, unspecified: Secondary | ICD-10-CM

## 2024-04-14 DIAGNOSIS — Z7901 Long term (current) use of anticoagulants: Secondary | ICD-10-CM | POA: Diagnosis not present

## 2024-04-14 DIAGNOSIS — E1165 Type 2 diabetes mellitus with hyperglycemia: Secondary | ICD-10-CM | POA: Insufficient documentation

## 2024-04-14 DIAGNOSIS — Z794 Long term (current) use of insulin: Secondary | ICD-10-CM | POA: Insufficient documentation

## 2024-04-14 LAB — CBG MONITORING, ED
Glucose-Capillary: 454 mg/dL — ABNORMAL HIGH (ref 70–99)
Glucose-Capillary: 196 mg/dL — ABNORMAL HIGH (ref 70–99)
Glucose-Capillary: 278 mg/dL — ABNORMAL HIGH (ref 70–99)

## 2024-04-14 LAB — CBC WITH DIFFERENTIAL/PLATELET
Abs Immature Granulocytes: 0.03 10*3/uL (ref 0.00–0.07)
Basophils Absolute: 0.1 10*3/uL (ref 0.0–0.1)
Basophils Relative: 1 %
Eosinophils Absolute: 0.1 10*3/uL (ref 0.0–0.5)
Eosinophils Relative: 1 %
HCT: 38.1 % (ref 36.0–46.0)
Hemoglobin: 12.7 g/dL (ref 12.0–15.0)
Immature Granulocytes: 0 %
Lymphocytes Relative: 27 %
Lymphs Abs: 2.3 10*3/uL (ref 0.7–4.0)
MCH: 29.4 pg (ref 26.0–34.0)
MCHC: 33.3 g/dL (ref 30.0–36.0)
MCV: 88.2 fL (ref 80.0–100.0)
Monocytes Absolute: 0.5 10*3/uL (ref 0.1–1.0)
Monocytes Relative: 7 %
Neutro Abs: 5.3 10*3/uL (ref 1.7–7.7)
Neutrophils Relative %: 64 %
Platelets: 243 10*3/uL (ref 150–400)
RBC: 4.32 MIL/uL (ref 3.87–5.11)
RDW: 11.9 % (ref 11.5–15.5)
WBC: 8.3 10*3/uL (ref 4.0–10.5)
nRBC: 0 % (ref 0.0–0.2)

## 2024-04-14 LAB — COMPREHENSIVE METABOLIC PANEL WITH GFR
ALT: 11 U/L (ref 0–44)
AST: 12 U/L — ABNORMAL LOW (ref 15–41)
Albumin: 3.6 g/dL (ref 3.5–5.0)
Alkaline Phosphatase: 54 U/L (ref 38–126)
Anion gap: 9 (ref 5–15)
BUN: 22 mg/dL (ref 8–23)
CO2: 27 mmol/L (ref 22–32)
Calcium: 9 mg/dL (ref 8.9–10.3)
Chloride: 98 mmol/L (ref 98–111)
Creatinine, Ser: 0.86 mg/dL (ref 0.44–1.00)
GFR, Estimated: >60 mL/min
Glucose, Bld: 506 mg/dL (ref 70–99)
Potassium: 4 mmol/L (ref 3.5–5.1)
Sodium: 134 mmol/L — ABNORMAL LOW (ref 135–145)
Total Bilirubin: 1 mg/dL (ref 0.0–1.2)
Total Protein: 7.7 g/dL (ref 6.5–8.1)

## 2024-04-14 LAB — URINALYSIS, ROUTINE W REFLEX MICROSCOPIC
Bacteria, UA: NONE SEEN
Bilirubin Urine: NEGATIVE
Glucose, UA: >=500 mg/dL — AB
Hgb urine dipstick: NEGATIVE
Ketones, ur: 5 mg/dL — AB
Leukocytes,Ua: NEGATIVE
Nitrite: NEGATIVE
Protein, ur: 100 mg/dL — AB
Specific Gravity, Urine: 1.027 (ref 1.005–1.030)
Squamous Epithelial / HPF: 0 /HPF (ref 0–5)
pH: 6 (ref 5.0–8.0)

## 2024-04-14 LAB — TROPONIN I (HIGH SENSITIVITY)
Troponin I (High Sensitivity): 18 ng/L — ABNORMAL HIGH
Troponin I (High Sensitivity): 20 ng/L — ABNORMAL HIGH (ref <18)

## 2024-04-14 LAB — MAGNESIUM: Magnesium: 1.7 mg/dL (ref 1.7–2.4)

## 2024-04-14 MED ORDER — CLONIDINE HCL 0.1 MG PO TABS
0.1000 mg | ORAL_TABLET | Freq: Once | ORAL | Status: AC
  Administered 2024-04-14: 0.1 mg via ORAL
  Filled 2024-04-14: qty 1

## 2024-04-14 MED ORDER — LACTATED RINGERS IV BOLUS
1000.0000 mL | Freq: Once | INTRAVENOUS | Status: AC
  Administered 2024-04-14: 1000 mL via INTRAVENOUS

## 2024-04-14 MED ORDER — INSULIN ASPART 100 UNIT/ML IJ SOLN
10.0000 [IU] | Freq: Once | INTRAMUSCULAR | Status: AC
Start: 2024-04-14 — End: 2024-04-14
  Administered 2024-04-14: 10 [IU] via INTRAVENOUS
  Filled 2024-04-14: qty 1

## 2024-04-14 NOTE — Discharge Instructions (Addendum)
 Call your primary care doctor to make a follow-up appointment.  Make sure you are taking all your medications every day and if your sugars are still running high then you may need additional medication or insulin started.  Return to the ER if you develop worsening symptoms or any other concerns

## 2024-04-14 NOTE — ED Provider Notes (Addendum)
 [facility] Provider Note    Event Date/Time   First MD Initiated Contact with Patient 04/14/24 1523     (approximate)   History   Hyperglycemia   HPI  Hailey Hawkins is a 78 y.o. female who comes in from home due to elevated sugars.  Glucose was in the 500s.  Patient has a history of A-fib on Eliquis, diabetes on insulin.  Patient reports that she noticed some increasing lightheadedness when she stood up and when she sits up.  She states that she feels like she is peeing more frequently and that it burns when she pees and she was worried about a UTI.  She reports that she does take insulin but she thinks she might be missing some medications because she forgets to take it.  Patient denies any dizziness at rest.  Denies any chest pain, abdominal pain.  She denies any new falls.  I reviewed the discharge note from 04/08/2024.  Patient had come in with syncope CT head negative.  Thought to be vasovagal though/orthostatic.  Patient did have E. coli UTI and discharged home on Keflex.    Physical Exam   Triage Vital Signs: ED Triage Vitals  Encounter Vitals Group     BP 04/14/24 1523 (!) 181/106     Systolic BP Percentile --      Diastolic BP Percentile --      Pulse Rate 04/14/24 1521 (!) 102     Resp 04/14/24 1521 15     Temp 04/14/24 1520 98.2 F (36.8 C)     Temp Source 04/14/24 1520 Oral     SpO2 04/14/24 1520 98 %     Weight --      Height --      Head Circumference --      Peak Flow --      Pain Score --      Pain Loc --      Pain Education --      Exclude from Growth Chart --     Most recent vital signs: Vitals:   04/14/24 1521 04/14/24 1523  BP:  (!) 181/106  Pulse: (!) 102 97  Resp: 15 (!) 22  Temp:    SpO2:       General: Awake, no distress.  CV:  Good peripheral perfusion.  Resp:  Normal effort.  Abd:  No distention.  Soft and nontender Other:  No swelling in legs.  No calf tenderness Cranial nerves are intact.  Equal  strength throughout.  Finger-nose intact.  Denies any dizziness at rest  ED Results / Procedures / Treatments   Labs (all labs ordered are listed, but only abnormal results are displayed) Labs Reviewed  CBG MONITORING, ED - Abnormal; Notable for the following components:      Result Value   Glucose-Capillary 454 (*)    All other components within normal limits     EKG  My interpretation of EKG:  Atrial fibrillation 96 ST elevation or T wave inversions, normal intervals  RADIOLOGY I have reviewed the CT personally and interpreted and no ICH    PROCEDURES:  Critical Care performed: No  .1-3 Lead EKG Interpretation  Performed by: Concha Se, MD Authorized by: Concha Se, MD     Interpretation: abnormal     ECG rate:  100   ECG rate assessment: tachycardic     Rhythm: atrial fibrillation     Ectopy: none     Conduction: normal  MEDICATIONS ORDERED IN ED: Medications - No data to display   IMPRESSION / MDM / ASSESSMENT AND PLAN / ED COURSE  I reviewed the triage vital signs and the nursing notes.   Patient's presentation is most consistent with acute presentation with potential threat to life or bodily function.   Patient comes in with hyperglycemia with lightheadedness and dysuria.  Could be related to missed insulin doses causing hyperglycemia.  Will give some IV fluids check electrolytes.  No evidence of stroke based upon examination.  Patient does have a history of A-fib and is currently in A-fib but looks rate controlled with heart rates less than 100.  Glucose downtrending from 454-196 troponins are slightly elevated but stable and flat doubt ACS.  She denied any chest pain.  She has had no arrhythmias on the monitor.  Urine with slight what WBCs but much improved from prior we will send for culture but I suspect that her symptoms are related to her elevated sugars and her urine and less likely UTI.  CBC is reassuring with normal white  count.  Reevaluated patient we discussed admission versus going home patient preferred to go home she reports feeling at her baseline self.  I suspect that her hypertension and high sugars are related to medication noncompliance due to her forgetting to take her meds.  She states that her husband will help her and is requesting to be discharged home at this time  The patient is on the cardiac monitor to evaluate for evidence of arrhythmia and/or significant heart rate changes.      FINAL CLINICAL IMPRESSION(S) / ED DIAGNOSES   Final diagnoses:  Hyperglycemia     Rx / DC Orders   ED Discharge Orders     None        Note:  This document was prepared using Dragon voice recognition software and may include unintentional dictation errors.   Lubertha Rush, MD 04/14/24 2014    Lubertha Rush, MD 04/14/24 2018

## 2024-04-14 NOTE — ED Notes (Signed)
 Glucose 454. Pt A&Ox4.

## 2024-04-14 NOTE — ED Notes (Signed)
 This RN gave report to [employer] and performed care handoff. This RN introduced self to pt. Call light in reach, bed wheels locked, side rail raised, pt updated on plan of care. Rounding completed.

## 2024-04-14 NOTE — ED Notes (Signed)
 Pt left ED without d/c instructions and with IV in arm.  Pt called back to ED. IV removed, pt discharged.

## 2024-04-14 NOTE — ED Notes (Signed)
 Pt ambulated with a walker and standby assist from RN with no c/o SOB or dizziness. MD aware.

## 2024-04-14 NOTE — ED Triage Notes (Signed)
 Pt arrives via ems from home with c/o hyperglycemia. Ems reports a glucose 556 and states pt received 500ml of normal saline. Pt is A&Ox4.  BP 160/100 O2 94% RA 108 HR with hx of afib

## 2024-04-16 LAB — URINE CULTURE: Culture: 10000 — AB

## 2024-04-18 ENCOUNTER — Emergency Department

## 2024-04-18 ENCOUNTER — Other Ambulatory Visit: Payer: Self-pay

## 2024-04-18 ENCOUNTER — Observation Stay

## 2024-04-18 ENCOUNTER — Inpatient Hospital Stay
Admission: EM | Admit: 2024-04-18 | Discharge: 2024-04-23 | DRG: 638 | Disposition: A | Discharge status: Home Health | Attending: Internal Medicine | Admitting: Internal Medicine

## 2024-04-18 DIAGNOSIS — Z86000 Personal history of in-situ neoplasm of breast: Secondary | ICD-10-CM

## 2024-04-18 DIAGNOSIS — Z85828 Personal history of other malignant neoplasm of skin: Secondary | ICD-10-CM

## 2024-04-18 DIAGNOSIS — Z885 Allergy status to narcotic agent status: Secondary | ICD-10-CM

## 2024-04-18 DIAGNOSIS — Z880 Allergy status to penicillin: Secondary | ICD-10-CM

## 2024-04-18 DIAGNOSIS — Z9181 History of falling: Secondary | ICD-10-CM

## 2024-04-18 DIAGNOSIS — Z881 Allergy status to other antibiotic agents status: Secondary | ICD-10-CM

## 2024-04-18 DIAGNOSIS — I10 Essential (primary) hypertension: Secondary | ICD-10-CM | POA: Diagnosis present

## 2024-04-18 DIAGNOSIS — Z886 Allergy status to analgesic agent status: Secondary | ICD-10-CM

## 2024-04-18 DIAGNOSIS — N179 Acute kidney failure, unspecified: Secondary | ICD-10-CM | POA: Diagnosis not present

## 2024-04-18 DIAGNOSIS — Z9011 Acquired absence of right breast and nipple: Secondary | ICD-10-CM

## 2024-04-18 DIAGNOSIS — Z79890 Hormone replacement therapy (postmenopausal): Secondary | ICD-10-CM

## 2024-04-18 DIAGNOSIS — I480 Paroxysmal atrial fibrillation: Secondary | ICD-10-CM | POA: Diagnosis present

## 2024-04-18 DIAGNOSIS — Z923 Personal history of irradiation: Secondary | ICD-10-CM

## 2024-04-18 DIAGNOSIS — G4734 Idiopathic sleep related nonobstructive alveolar hypoventilation: Secondary | ICD-10-CM | POA: Insufficient documentation

## 2024-04-18 DIAGNOSIS — Z800 Family history of malignant neoplasm of digestive organs: Secondary | ICD-10-CM

## 2024-04-18 DIAGNOSIS — E860 Dehydration: Secondary | ICD-10-CM | POA: Diagnosis present

## 2024-04-18 DIAGNOSIS — R55 Syncope and collapse: Secondary | ICD-10-CM | POA: Diagnosis present

## 2024-04-18 DIAGNOSIS — I350 Nonrheumatic aortic (valve) stenosis: Secondary | ICD-10-CM | POA: Diagnosis present

## 2024-04-18 DIAGNOSIS — Z820 Family history of epilepsy and other diseases of the nervous system: Secondary | ICD-10-CM

## 2024-04-18 DIAGNOSIS — Z9221 Personal history of antineoplastic chemotherapy: Secondary | ICD-10-CM

## 2024-04-18 DIAGNOSIS — E1165 Type 2 diabetes mellitus with hyperglycemia: Secondary | ICD-10-CM | POA: Diagnosis not present

## 2024-04-18 DIAGNOSIS — Z7984 Long term (current) use of oral hypoglycemic drugs: Secondary | ICD-10-CM

## 2024-04-18 DIAGNOSIS — R413 Other amnesia: Secondary | ICD-10-CM | POA: Diagnosis not present

## 2024-04-18 DIAGNOSIS — Z79899 Other long term (current) drug therapy: Secondary | ICD-10-CM

## 2024-04-18 DIAGNOSIS — Z794 Long term (current) use of insulin: Secondary | ICD-10-CM

## 2024-04-18 DIAGNOSIS — Z96653 Presence of artificial knee joint, bilateral: Secondary | ICD-10-CM | POA: Diagnosis present

## 2024-04-18 DIAGNOSIS — E861 Hypovolemia: Secondary | ICD-10-CM | POA: Diagnosis present

## 2024-04-18 DIAGNOSIS — R0902 Hypoxemia: Secondary | ICD-10-CM | POA: Diagnosis present

## 2024-04-18 DIAGNOSIS — K219 Gastro-esophageal reflux disease without esophagitis: Secondary | ICD-10-CM | POA: Diagnosis present

## 2024-04-18 DIAGNOSIS — E669 Obesity, unspecified: Secondary | ICD-10-CM | POA: Diagnosis present

## 2024-04-18 DIAGNOSIS — E063 Autoimmune thyroiditis: Secondary | ICD-10-CM | POA: Diagnosis present

## 2024-04-18 DIAGNOSIS — R739 Hyperglycemia, unspecified: Principal | ICD-10-CM

## 2024-04-18 DIAGNOSIS — E039 Hypothyroidism, unspecified: Secondary | ICD-10-CM | POA: Diagnosis present

## 2024-04-18 DIAGNOSIS — Z888 Allergy status to other drugs, medicaments and biological substances status: Secondary | ICD-10-CM

## 2024-04-18 DIAGNOSIS — E7800 Pure hypercholesterolemia, unspecified: Secondary | ICD-10-CM | POA: Diagnosis present

## 2024-04-18 DIAGNOSIS — Z7901 Long term (current) use of anticoagulants: Secondary | ICD-10-CM

## 2024-04-18 DIAGNOSIS — J4489 Other specified chronic obstructive pulmonary disease: Secondary | ICD-10-CM | POA: Diagnosis present

## 2024-04-18 DIAGNOSIS — I951 Orthostatic hypotension: Secondary | ICD-10-CM | POA: Diagnosis present

## 2024-04-18 LAB — COMPREHENSIVE METABOLIC PANEL WITH GFR
ALT: 9 U/L (ref 0–44)
AST: 12 U/L — ABNORMAL LOW (ref 15–41)
Albumin: 3.1 g/dL — ABNORMAL LOW (ref 3.5–5.0)
Alkaline Phosphatase: 60 U/L (ref 38–126)
Anion gap: 7 (ref 5–15)
BUN: 28 mg/dL — ABNORMAL HIGH (ref 8–23)
CO2: 26 mmol/L (ref 22–32)
Calcium: 8.7 mg/dL — ABNORMAL LOW (ref 8.9–10.3)
Chloride: 97 mmol/L — ABNORMAL LOW (ref 98–111)
Creatinine, Ser: 1.16 mg/dL — ABNORMAL HIGH (ref 0.44–1.00)
GFR, Estimated: 48 mL/min — ABNORMAL LOW (ref >60)
Glucose, Bld: 582 mg/dL (ref 70–99)
Potassium: 3.9 mmol/L (ref 3.5–5.1)
Sodium: 130 mmol/L — ABNORMAL LOW (ref 135–145)
Total Bilirubin: 1 mg/dL (ref 0.0–1.2)
Total Protein: 6.5 g/dL (ref 6.5–8.1)

## 2024-04-18 LAB — URINALYSIS, W/ REFLEX TO CULTURE (INFECTION SUSPECTED)
Bilirubin Urine: NEGATIVE
Glucose, UA: >=500 mg/dL — AB
Ketones, ur: NEGATIVE mg/dL
Nitrite: NEGATIVE
Protein, ur: NEGATIVE mg/dL
Specific Gravity, Urine: 1.026 (ref 1.005–1.030)
pH: 5 (ref 5.0–8.0)

## 2024-04-18 LAB — CBC WITH DIFFERENTIAL/PLATELET
Abs Immature Granulocytes: 0.02 10*3/uL (ref 0.00–0.07)
Basophils Absolute: 0 10*3/uL (ref 0.0–0.1)
Basophils Relative: 1 %
Eosinophils Absolute: 0.1 10*3/uL (ref 0.0–0.5)
Eosinophils Relative: 2 %
HCT: 30.9 % — ABNORMAL LOW (ref 36.0–46.0)
Hemoglobin: 10.6 g/dL — ABNORMAL LOW (ref 12.0–15.0)
Immature Granulocytes: 0 %
Lymphocytes Relative: 23 %
Lymphs Abs: 1.2 10*3/uL (ref 0.7–4.0)
MCH: 29.4 pg (ref 26.0–34.0)
MCHC: 34.3 g/dL (ref 30.0–36.0)
MCV: 85.8 fL (ref 80.0–100.0)
Monocytes Absolute: 0.4 10*3/uL (ref 0.1–1.0)
Monocytes Relative: 7 %
Neutro Abs: 3.5 10*3/uL (ref 1.7–7.7)
Neutrophils Relative %: 67 %
Platelets: 172 10*3/uL (ref 150–400)
RBC: 3.6 MIL/uL — ABNORMAL LOW (ref 3.87–5.11)
RDW: 11.9 % (ref 11.5–15.5)
WBC: 5.2 10*3/uL (ref 4.0–10.5)
nRBC: 0 % (ref 0.0–0.2)

## 2024-04-18 LAB — CK: Total CK: 23 U/L — ABNORMAL LOW (ref 38–234)

## 2024-04-18 LAB — GLUCOSE, CAPILLARY
Glucose-Capillary: 377 mg/dL — ABNORMAL HIGH (ref 70–99)
Glucose-Capillary: 253 mg/dL — ABNORMAL HIGH (ref 70–99)

## 2024-04-18 LAB — BETA-HYDROXYBUTYRIC ACID: Beta-Hydroxybutyric Acid: 0.65 mmol/L — ABNORMAL HIGH (ref 0.05–0.27)

## 2024-04-18 LAB — OSMOLALITY: Osmolality: 311 mosm/kg — ABNORMAL HIGH (ref 275–295)

## 2024-04-18 LAB — CBG MONITORING, ED
Glucose-Capillary: 579 mg/dL (ref 70–99)
Glucose-Capillary: 418 mg/dL — ABNORMAL HIGH (ref 70–99)

## 2024-04-18 LAB — TSH: TSH: 4.016 u[IU]/mL (ref 0.350–4.500)

## 2024-04-18 MED ORDER — POLYETHYLENE GLYCOL 3350 17 G PO PACK
17.0000 g | PACK | Freq: Every day | ORAL | Status: DC | PRN
  Administered 2024-04-19: 17 g via ORAL
  Filled 2024-04-18 (×2): qty 1

## 2024-04-18 MED ORDER — ATORVASTATIN CALCIUM 10 MG PO TABS
10.0000 mg | ORAL_TABLET | Freq: Every day | ORAL | Status: DC
  Administered 2024-04-18 – 2024-04-23 (×6): 10 mg via ORAL
  Filled 2024-04-18 (×6): qty 1

## 2024-04-18 MED ORDER — ACETAMINOPHEN 650 MG RE SUPP
650.0000 mg | Freq: Four times a day (QID) | RECTAL | Status: DC | PRN
Start: 2024-04-18 — End: 2024-04-23

## 2024-04-18 MED ORDER — ONDANSETRON HCL 4 MG/2ML IJ SOLN
4.0000 mg | Freq: Four times a day (QID) | INTRAMUSCULAR | Status: DC | PRN
  Filled 2024-04-18: qty 2

## 2024-04-18 MED ORDER — ONDANSETRON HCL 4 MG PO TABS: 4.0000 mg | ORAL_TABLET | Freq: Four times a day (QID) | ORAL | Status: DC | PRN

## 2024-04-18 MED ORDER — QUETIAPINE FUMARATE 25 MG PO TABS
50.0000 mg | ORAL_TABLET | Freq: Every day | ORAL | Status: DC
  Administered 2024-04-18 – 2024-04-22 (×5): 50 mg via ORAL
  Filled 2024-04-18 (×5): qty 2

## 2024-04-18 MED ORDER — ALPRAZOLAM 0.5 MG PO TABS
0.5000 mg | ORAL_TABLET | Freq: Every evening | ORAL | Status: DC | PRN
  Administered 2024-04-18 – 2024-04-19 (×2): 0.5 mg via ORAL
  Filled 2024-04-18 (×2): qty 1

## 2024-04-18 MED ORDER — INSULIN ASPART 100 UNIT/ML IJ SOLN
0.0000 [IU] | Freq: Three times a day (TID) | INTRAMUSCULAR | Status: DC
  Administered 2024-04-18: 15 [IU] via SUBCUTANEOUS
  Administered 2024-04-19: 3 [IU] via SUBCUTANEOUS
  Administered 2024-04-19: 11 [IU] via SUBCUTANEOUS
  Administered 2024-04-19: 8 [IU] via SUBCUTANEOUS
  Administered 2024-04-20: 11 [IU] via SUBCUTANEOUS
  Filled 2024-04-18 (×5): qty 1

## 2024-04-18 MED ORDER — LEVOTHYROXINE SODIUM 50 MCG PO TABS
75.0000 ug | ORAL_TABLET | Freq: Every day | ORAL | Status: DC
  Administered 2024-04-19 – 2024-04-23 (×5): 75 ug via ORAL
  Filled 2024-04-18 (×5): qty 1

## 2024-04-18 MED ORDER — ACETAMINOPHEN 325 MG PO TABS
650.0000 mg | ORAL_TABLET | Freq: Four times a day (QID) | ORAL | Status: DC | PRN
  Administered 2024-04-20: 650 mg via ORAL
  Filled 2024-04-18: qty 2

## 2024-04-18 MED ORDER — ALBUTEROL SULFATE (2.5 MG/3ML) 0.083% IN NEBU: 2.5000 mg | INHALATION_SOLUTION | RESPIRATORY_TRACT | Status: DC | PRN

## 2024-04-18 MED ORDER — VENLAFAXINE HCL ER 37.5 MG PO CP24
37.5000 mg | ORAL_CAPSULE | Freq: Every day | ORAL | Status: DC
  Administered 2024-04-19 – 2024-04-23 (×5): 37.5 mg via ORAL
  Filled 2024-04-18 (×5): qty 1

## 2024-04-18 MED ORDER — LACTATED RINGERS IV SOLN: INTRAVENOUS | Status: DC

## 2024-04-18 MED ORDER — SODIUM CHLORIDE 0.9% FLUSH
3.0000 mL | Freq: Two times a day (BID) | INTRAVENOUS | Status: DC
  Administered 2024-04-18 – 2024-04-22 (×10): 3 mL via INTRAVENOUS

## 2024-04-18 MED ORDER — INSULIN GLARGINE-YFGN 100 UNIT/ML ~~LOC~~ SOLN
10.0000 [IU] | Freq: Every day | SUBCUTANEOUS | Status: DC
  Administered 2024-04-18: 10 [IU] via SUBCUTANEOUS
  Filled 2024-04-18: qty 0.1

## 2024-04-18 MED ORDER — APIXABAN 5 MG PO TABS
5.0000 mg | ORAL_TABLET | Freq: Two times a day (BID) | ORAL | Status: DC
  Administered 2024-04-18 – 2024-04-23 (×10): 5 mg via ORAL
  Filled 2024-04-18 (×10): qty 1

## 2024-04-18 MED ORDER — INSULIN ASPART 100 UNIT/ML IJ SOLN
10.0000 [IU] | Freq: Once | INTRAMUSCULAR | Status: AC
  Administered 2024-04-18: 10 [IU] via INTRAVENOUS
  Filled 2024-04-18: qty 1

## 2024-04-18 MED ORDER — SODIUM CHLORIDE 0.9 % IV BOLUS
1000.0000 mL | Freq: Once | INTRAVENOUS | Status: AC
  Administered 2024-04-18: 1000 mL via INTRAVENOUS

## 2024-04-18 MED ORDER — ENOXAPARIN SODIUM 40 MG/0.4ML IJ SOSY: 40.0000 mg | PREFILLED_SYRINGE | INTRAMUSCULAR | Status: DC

## 2024-04-18 NOTE — ED Notes (Signed)
 X-ray at bedside

## 2024-04-18 NOTE — Assessment & Plan Note (Signed)
-   Continue home supplemental oxygen at that time

## 2024-04-18 NOTE — H&P (Addendum)
 History and Physical    Patient: Hailey Hawkins YNW:[id number] DOB: 1946-06-04 DOA: 04/18/2024 DOS: the patient was seen and examined on 04/18/2024 PCP: Yehuda Helms, MD  Patient coming from: Home  Chief Complaint:  Chief Complaint  Patient presents with   Fall   HPI: Hailey Hawkins is a 78 y.o. female with medical history significant of PAF on Eliquis , refractory HTN, mild aortic stenosis, breast cancer stage Ia status post chemo/immunotherapy, IDDM, HLD, hypothyroidism, asthma/COPD who presents to the ED due to ground level fall.   Hailey Hawkins states that she is uncertain exactly what happened last night, but remembers being dizzy and then falling onto her buttocks.  She notes that this has been happening recurrently over the last few days.  She cannot recall if she is taking her insulin .  She notes that she has never required help from her family with medication management, but they are willing to help her now.  She denies any headache, chest pain, shortness of breath, focal weakness, nausea, vomiting, diarrhea.  She states that she seems more forgetful over the last few weeks.  ED Course:  On arrival to the ED, patient was normotensive at 129/73 with heart rate of 65.  She was saturating at 99% on room air.  She was afebrile at 97.6.  Initial CBG 582.  Other initial workup notable for hemoglobin of 10.6, sodium 130, creatinine 1.16, BUN 28, GFR 48, bicarb 26, anion gap 7.  Osmolality 311.  Beta hydroxybutyrate acid 0.65.  Urinalysis with small hematuria, glucosuria, trace leukocytes and rare bacteria but no WBC per hpf.  CT of the head, C-spine, and pelvis with no acute abnormalities.  Patient given one-time dose of IV insulin  and 1 L bolus.  TRH contacted for admission.   Review of Systems: As mentioned in the history of present illness. All other systems reviewed and are negative.  Past Medical History:  Diagnosis Date   Anemia    pernicious   Asthma    Back pain    Basal  cell carcinoma 03/17/2023   mid nasal dorsum, Mohs 05/06/2023   Breast cancer (HCC) 2015   right c lumpectomy c radiation   DCIS (ductal carcinoma in situ) of breast    Diabetes mellitus without complication (HCC)    Elevated cholesterol    GERD (gastroesophageal reflux disease)    Hashimoto's disease    Hypertension    Hypothyroidism    MRSA infection    Obesity goither   Personal history of radiation therapy 2015   right breast DCIS   SIADH (syndrome of inappropriate ADH production) (HCC)    SOB (shortness of breath)    Tachycardia    Past Surgical History:  Procedure Laterality Date   ABDOMINAL HYSTERECTOMY     BREAST BIOPSY Right 11/2013   DCIS   BREAST LUMPECTOMY Right 2015   DCIS, clear margins   CESAREAN SECTION     x2   colononoscopy     COLONOSCOPY N/A 08/14/2016   Procedure: COLONOSCOPY;  Surgeon: Cassie Click, MD;  Location: [facility] ENDOSCOPY;  Service: Endoscopy;  Laterality: N/A;   COLONOSCOPY WITH ESOPHAGOGASTRODUODENOSCOPY (EGD)     ESOPHAGOGASTRODUODENOSCOPY (EGD) WITH PROPOFOL  N/A 08/14/2016   Procedure: ESOPHAGOGASTRODUODENOSCOPY (EGD) WITH PROPOFOL ;  Surgeon: Cassie Click, MD;  Location: [facility] ENDOSCOPY;  Service: Endoscopy;  Laterality: N/A;   INCISION AND DRAINAGE ABSCESS Left 10/18/2021   Procedure: INCISION AND DRAINAGE ABSCESS;  Surgeon: Eldred Grego, MD;  Location: [facility] ORS;  Service: General;  Laterality: Left;  Right labia   MASTECTOMY     MASTECTOMY, PARTIAL Right    REPLACEMENT TOTAL KNEE Right    REPLACEMENT TOTAL KNEE Left    THYROIDECTOMY, PARTIAL     caused throat paralysis  right side   TONSILLECTOMY     Social History:  reports that she has never smoked. She has never used smokeless tobacco. She reports that she does not drink alcohol and does not use drugs.  Allergies  Allergen Reactions   Methamphetamine Shortness Of Breath    Difficulty breathing (INHALER)   Amoxicillin     Other reaction(s): Diarrhea and vomiting  (finding)   Antihistamines, Chlorpheniramine-Type     Other reaction(s): Other (See Comments) Severe dryness   Ciprofloxacin     Other reaction(s): Distress (finding)   Codeine     Other reaction(s): Diarrhea and vomiting (finding), Weal or any derivitive   Other Other (See Comments)    Birth control pills - blood clots Birth control pills - blood clots   Oxycodone-Acetaminophen  Nausea And Vomiting   Penicillin G     Other reaction(s): Weal   Meloxicam Palpitations    Family History  Problem Relation Age of Onset   Cancer Daughter        colon cancer; age 17   Colon cancer Daughter    Breast cancer Neg Hx     Prior to Admission medications   Medication Sig Start Date End Date Taking? Authorizing Provider  acetaminophen  (TYLENOL ) 500 MG tablet Take 1,000 mg by mouth every 6 (six) hours as needed for mild pain.    [provider]  albuterol  (PROVENTIL  HFA;VENTOLIN  HFA) 108 (90 BASE) MCG/ACT inhaler Inhale 2 puffs into the lungs every 4 (four) hours as needed for wheezing or shortness of breath.    [provider]  ALPRAZolam  (XANAX ) 0.5 MG tablet Take 0.5 mg by mouth at bedtime as needed for sleep.    [provider]  apixaban  (ELIQUIS ) 5 MG TABS tablet Take 5 mg by mouth 2 (two) times daily.    [provider]  atorvastatin  (LIPITOR) 10 MG tablet Take 10 mg by mouth daily.  11/04/16 04/04/24  [provider]  cloNIDine  (CATAPRES ) 0.2 MG tablet Take 0.5 tablets (0.1 mg total) by mouth 2 (two) times daily. 04/08/24 05/08/24  Agbata, Gregary Lean, MD  desvenlafaxine (PRISTIQ) 25 MG 24 hr tablet Take 25 mg by mouth daily. 12/01/23 11/30/24  [provider]  empagliflozin  (JARDIANCE ) 25 MG TABS tablet Take 25 mg by mouth daily. 03/18/24   [provider]  glucose blood (FREESTYLE LITE) test strip Use 2 (two) times daily 07/29/19   [provider]  insulin  glargine (LANTUS ) 100 UNIT/ML injection Inject 20 Units into the skin at  bedtime.    [provider]  Insulin  Pen Needle (NOVOFINE) 30G X 8 MM MISC as directed To inject insulin . 07/29/19   [provider]  Insulin  Syringe-Needle U-100 (INSULIN  SYRINGE .5CC/31GX5/16") 31G X 5/16" 0.5 ML MISC Use 1 syringe daily for insulin  injections 04/26/18   [provider]  isosorbide  mononitrate (IMDUR ) 30 MG 24 hr tablet Take 30 mg by mouth 2 (two) times daily. 12/14/19 04/04/24  [provider]  levothyroxine  (SYNTHROID ) 75 MCG tablet Take 75 mcg by mouth daily before breakfast. 11/12/19   [provider]  metFORMIN (GLUCOPHAGE) 1000 MG tablet Take 1,000 mg by mouth 2 (two) times daily with a meal.    [provider]  Multiple Vitamin (MULTIVITAMIN) tablet Take  1 tablet by mouth daily.    [provider]  mupirocin ointment (BACTROBAN) 2 % Apply 1 Application topically 3 (three) times daily.    [provider]  omeprazole (PRILOSEC) 20 MG capsule Take 20 mg by mouth 2 (two) times daily before a meal. 11/02/19   [provider]  OXYGEN Inhale 2 L into the lungs at bedtime.    [provider]  propranolol  (INDERAL ) 20 MG tablet Take 1 tablet by mouth 2 (two) times daily. 02/26/24 02/25/25  [provider]  QUEtiapine  (SEROQUEL ) 50 MG tablet Take 50 mg by mouth at bedtime. 12/08/20   [provider]    Physical Exam: Vitals:   04/18/24 1200 04/18/24 1300 04/18/24 1330 04/18/24 1445  BP: 128/73 138/73 136/76 (!) 127/111  Pulse: 61 64 64 63  Resp: 12 15 16 16   Temp:  97.9 F (36.6 C)    TempSrc:  Oral    SpO2: 100% 97% 91% 98%  Weight:      Height:       Physical Exam Vitals and nursing note reviewed.  Constitutional:      General: She is not in acute distress.    Appearance: She is normal weight. She is not toxic-appearing.  HENT:     Head: Normocephalic and atraumatic.     Mouth/Throat:     Mouth: Mucous membranes are dry.     Pharynx: Oropharynx is clear.      Comments: Very dry Eyes:     Extraocular Movements: Extraocular movements intact.     Conjunctiva/sclera: Conjunctivae normal.     Pupils: Pupils are equal, round, and reactive to light.  Cardiovascular:     Rate and Rhythm: Normal rate and regular rhythm.     Heart sounds: Murmur heard.     Decrescendo systolic murmur is present with a grade of 3/6.  Pulmonary:     Effort: Pulmonary effort is normal. No respiratory distress.     Breath sounds: Normal breath sounds.  Abdominal:     General: Bowel sounds are normal. There is no distension.     Palpations: Abdomen is soft.     Tenderness: There is no abdominal tenderness. There is no guarding.  Musculoskeletal:     Right lower leg: No edema.     Left lower leg: No edema.  Skin:    General: Skin is warm and dry.     Findings: No bruising or rash.  Neurological:     Mental Status: She is alert.     Comments:  Patient is alert and oriented x 3 4/5 strength throughout Sensation grossly intact No facial asymmetry or dysarthria  Psychiatric:        Mood and Affect: Mood normal.        Behavior: Behavior normal.    Data Reviewed: CBC with WBC of 5.2, hemoglobin of 10.6, MCV of 85, platelets 172 CMP with sodium of 130, potassium 3.9, bicarb 26, glucose 582, BUN 28, creat 1.16, AST 12, and GFR 48 CK23 Serum osmolality 311 Beta hydroxybutyrate acid 0.65 Urinalysis with glucosuria, hematuria, leukocytes, rare bacteria but no evidence of inflammation  EKG personally reviewed.  Sinus rhythm with a rate of 62.  PAC noted.  No ST or T wave changes concerning for acute ischemia.  DG Pelvis 1-2 Views Result Date: 04/18/2024 CLINICAL DATA:  Pain after fall. EXAM: PELVIS - 1-2 VIEW COMPARISON:  04/04/2024. FINDINGS: There is no evidence of acute fracture or diastasis. Femoral heads are seated within  the acetabula. Severe degenerative changes of the right hip resulting in bone-on-bone contact and subchondral sclerosis. Mild-to-moderate  degenerative changes of the left hip. Sacroiliac joints and pubic symphysis are anatomically aligned with degenerative changes. Vascular calcifications are present. IMPRESSION: No acute osseous abnormality. Electronically Signed   By: Mannie Seek M.D.   On: 04/18/2024 11:24   CT Head Wo Contrast Result Date: 04/18/2024 CLINICAL DATA:  Found down on floor, insert mechanism of fall. EXAM: CT HEAD WITHOUT CONTRAST CT CERVICAL SPINE WITHOUT CONTRAST TECHNIQUE: Multidetector CT imaging of the head and cervical spine was performed following the standard protocol without intravenous contrast. Multiplanar CT image reconstructions of the cervical spine were also generated. RADIATION DOSE REDUCTION: This exam was performed according to the departmental dose-optimization program which includes automated exposure control, adjustment of the mA and/or kV according to patient size and/or use of iterative reconstruction technique. COMPARISON:  CT head 04/14/2024. FINDINGS: CT HEAD FINDINGS Brain: No acute intracranial hemorrhage. No CT evidence of acute infarct. Nonspecific hypoattenuation in the periventricular and subcortical white matter favored to reflect chronic microvascular ischemic changes. No edema, mass effect, or midline shift. The basilar cisterns are patent. Ventricles: Prominence of the ventricles suggesting underlying parenchymal volume loss. Vascular: Atherosclerotic calcifications of the carotid siphons and intracranial vertebral arteries. No hyperdense vessel. Skull: No acute or aggressive finding. Orbits: Orbits are symmetric. Sinuses: Layering secretions in the bilateral sphenoid sinuses. Other: Mastoid air cells are clear. CT CERVICAL SPINE FINDINGS Alignment: There is straightening of the cervical lordosis within the lower cervical spine. Trace retrolisthesis of C2 on C3 and trace anterolisthesis of C3 on C4. Trace retrolisthesis of C7 on T1. No facet subluxation or dislocation. Skull base and  vertebrae: No compression fracture or displaced fracture in the cervical spine. No suspicious osseous lesion. Soft tissues and spinal canal: No prevertebral fluid or swelling. No visible canal hematoma. Prominence of the left thyroid  lobe with multiple small calcifications. Disc levels: Intervertebral disc space narrowing at multiple levels. Disc osteophyte complex at multiple levels in the cervical spine. No high-grade osseous spinal canal stenosis. Facet arthrosis throughout the cervical spine. Foraminal narrowing at multiple levels which is most pronounced on the left at C6-7. Upper chest: Negative. Other: None. IMPRESSION: No CT evidence of acute intracranial abnormality. No acute fracture or traumatic malalignment of the cervical spine. Layering secretions in the bilateral sphenoid sinuses. Recommend correlation for acute sinusitis. Mild chronic microvascular ischemic changes and mild parenchymal volume loss. Degenerative changes of the cervical spine as above. Electronically Signed   By: Denny Flack M.D.   On: 04/18/2024 11:08   CT Cervical Spine Wo Contrast Result Date: 04/18/2024 CLINICAL DATA:  Found down on floor, insert mechanism of fall. EXAM: CT HEAD WITHOUT CONTRAST CT CERVICAL SPINE WITHOUT CONTRAST TECHNIQUE: Multidetector CT imaging of the head and cervical spine was performed following the standard protocol without intravenous contrast. Multiplanar CT image reconstructions of the cervical spine were also generated. RADIATION DOSE REDUCTION: This exam was performed according to the departmental dose-optimization program which includes automated exposure control, adjustment of the mA and/or kV according to patient size and/or use of iterative reconstruction technique. COMPARISON:  CT head 04/14/2024. FINDINGS: CT HEAD FINDINGS Brain: No acute intracranial hemorrhage. No CT evidence of acute infarct. Nonspecific hypoattenuation in the periventricular and subcortical white matter favored to  reflect chronic microvascular ischemic changes. No edema, mass effect, or midline shift. The basilar cisterns are patent. Ventricles: Prominence of the ventricles suggesting underlying parenchymal volume loss. Vascular: Atherosclerotic  calcifications of the carotid siphons and intracranial vertebral arteries. No hyperdense vessel. Skull: No acute or aggressive finding. Orbits: Orbits are symmetric. Sinuses: Layering secretions in the bilateral sphenoid sinuses. Other: Mastoid air cells are clear. CT CERVICAL SPINE FINDINGS Alignment: There is straightening of the cervical lordosis within the lower cervical spine. Trace retrolisthesis of C2 on C3 and trace anterolisthesis of C3 on C4. Trace retrolisthesis of C7 on T1. No facet subluxation or dislocation. Skull base and vertebrae: No compression fracture or displaced fracture in the cervical spine. No suspicious osseous lesion. Soft tissues and spinal canal: No prevertebral fluid or swelling. No visible canal hematoma. Prominence of the left thyroid  lobe with multiple small calcifications. Disc levels: Intervertebral disc space narrowing at multiple levels. Disc osteophyte complex at multiple levels in the cervical spine. No high-grade osseous spinal canal stenosis. Facet arthrosis throughout the cervical spine. Foraminal narrowing at multiple levels which is most pronounced on the left at C6-7. Upper chest: Negative. Other: None. IMPRESSION: No CT evidence of acute intracranial abnormality. No acute fracture or traumatic malalignment of the cervical spine. Layering secretions in the bilateral sphenoid sinuses. Recommend correlation for acute sinusitis. Mild chronic microvascular ischemic changes and mild parenchymal volume loss. Degenerative changes of the cervical spine as above. Electronically Signed   By: Denny Flack M.D.   On: 04/18/2024 11:08   Results are pending, will review when available.  Assessment and Plan:  * Pre-syncope Patient is presenting  with recurrent dizziness leading to ground-level falls without syncope.  She was admitted for the same earlier this month with positive orthostatic vital signs.  She was dizzy on sitting up for examination, and seems notably hypovolemic with dry mucous membranes and AKI.  Likely due to uncontrolled hyperglycemia and memory deficits leading to poor oral intake.  - S/p 1 L bolus - Continue maintenance fluids - Check orthostatic vital signs - PT/OT  Uncontrolled type 2 diabetes mellitus with hyperglycemia, with long-term current use of insulin  (HCC) Patient has a history of previously relatively well-controlled insulin -dependent type 2 diabetes, for which she follows with endocrinology.  Over the last 1 month, blood sugars have been uncontrolled, potentially due to developing memory deficits with lack of support at home.  Will need to discuss with family if they are able to help manage her insulin  regimen.  No evidence of DKA.  Serum osmolality is mildly elevated, however suspect this is more due to dehydration.  No indication for insulin  drip.  - S/p one-time dose IV insulin  - Start SSI, moderate - Semglee  10 units at bedtime - Hold home regimen  Memory deficits Patient reports she is been having difficulty remembering if she has been taking her medications over the last several days with no focal neurological deficits on examination.  Potentially multifactorial in the setting of hyperglycemia, dehydration.  Although there are leukocytes and rare bacteria on UA, she denies any urinary symptoms and no evidence of elevated WBC.  Given multiple risk factors for intracranial etiology, will obtain an MRI, however low suspicion for acute CVA.  Discussed with patient's daughter, Hailey Hawkins.  She notes that family has noticed a decline in patient's memory for the last several months.  Patient's brother was recently diagnosed with Alzheimer's dementia.  - MRI without contrast - Will need outpatient follow-up  with neurology for further workup of possible underlying dementia  AKI (acute kidney injury) (HCC) In the setting of poor oral intake and dehydration.  - IV fluids as ordered - Repeat BMP in the  a.m. - Hold home nephrotoxic agents  Nocturnal hypoxia - Continue home supplemental oxygen at that time  Paroxysmal atrial fibrillation (HCC) - Continue home regimen  Essential hypertension - Hold home regimen in the setting of orthostatic hypotension  Acquired hypothyroidism - TSH pending - Continue home Synthroid   Advance Care Planning:   Code Status: Full Code   Consults: None  Family Communication: Patient's daughter Hailey Hawkins updated via telephone.   Severity of Illness: The appropriate patient status for this patient is OBSERVATION. Observation status is judged to be reasonable and necessary in order to provide the required intensity of service to ensure the patient's safety. The patient's presenting symptoms, physical exam findings, and initial radiographic and laboratory data in the context of their medical condition is felt to place them at decreased risk for further clinical deterioration. Furthermore, it is anticipated that the patient will be medically stable for discharge from the hospital within 2 midnights of admission.   Author: Avi Body, MD 04/18/2024 3:50 PM  For on call review [email or web address].

## 2024-04-18 NOTE — Assessment & Plan Note (Signed)
 Continue Synthroid

## 2024-04-18 NOTE — Assessment & Plan Note (Signed)
 -  Continue home regimen

## 2024-04-18 NOTE — ED Provider Notes (Signed)
 [facility] Provider Note    Event Date/Time   First MD Initiated Contact with Patient 04/18/24 0848     (approximate)   History   Fall   HPI Hailey Hawkins is a 78 y.o. female with history of DM2, HTN, HLD, hypothyroidism, A-fib on Eliquis  presenting today for hyperglycemia.  EMS was called to house after patient was found down on the ground.  She does not remember how she ended up on the floor and woke up with EMS around her.  She notes pain to her bottom but denies pain to her head or neck or any extremities.  Otherwise denies chest pain, shortness of breath, nausea, vomiting, abdominal pain.  No pain with urination.  Unsure if she is taking her insulin  as she is becoming very forgetful. With EMS was found to have blood sugar > 600.  Chart review: Seen in the ED 4 days ago for hyperglycemia.     Physical Exam   Triage Vital Signs: ED Triage Vitals  Encounter Vitals Group     BP      Systolic BP Percentile      Diastolic BP Percentile      Pulse      Resp      Temp      Temp src      SpO2      Weight      Height      Head Circumference      Peak Flow      Pain Score      Pain Loc      Pain Education      Exclude from Growth Chart     Most recent vital signs: Vitals:   04/18/24 1000 04/18/24 1200  BP: 117/85 128/73  Pulse: 67 61  Resp: 14 12  Temp:    SpO2: 97% 100%    Physical Exam: I have reviewed the vital signs and nursing notes. General: Awake, alert, no acute distress.  Nontoxic appearing. Head:  Atraumatic, normocephalic.   ENT:  EOM intact, PERRL. Oral mucosa is pink and moist with no lesions. Neck: Neck is supple with full range of motion, No meningeal signs. Cardiovascular:  RRR, No murmurs. Peripheral pulses palpable and equal bilaterally. Respiratory:  Symmetrical chest wall expansion.  No rhonchi, rales, or wheezes.  Good air movement throughout.  No use of accessory muscles.   Musculoskeletal:  No cyanosis or  edema. Moving extremities with full ROM Abdomen:  Soft, nontender, nondistended. Neuro:  GCS 14 (intermittent confusion and memory loss), moving all four extremities, interacting appropriately. Speech clear. Psych:  Calm, appropriate.   Skin:  Warm, dry, no rash.    ED Results / Procedures / Treatments   Labs (all labs ordered are listed, but only abnormal results are displayed) Labs Reviewed  CBC WITH DIFFERENTIAL/PLATELET - Abnormal; Notable for the following components:      Result Value   RBC 3.60 (*)    Hemoglobin 10.6 (*)    HCT 30.9 (*)    All other components within normal limits  COMPREHENSIVE METABOLIC PANEL WITH GFR - Abnormal; Notable for the following components:   Sodium 130 (*)    Chloride 97 (*)    Glucose, Bld 582 (*)    BUN 28 (*)    Creatinine, Ser 1.16 (*)    Calcium  8.7 (*)    Albumin 3.1 (*)    AST 12 (*)    GFR, Estimated 48 (*)  All other components within normal limits  URINALYSIS, W/ REFLEX TO CULTURE (INFECTION SUSPECTED) - Abnormal; Notable for the following components:   Color, Urine YELLOW (*)    APPearance HAZY (*)    Glucose, UA >=500 (*)    Hgb urine dipstick SMALL (*)    Leukocytes,Ua TRACE (*)    Bacteria, UA RARE (*)    All other components within normal limits  BETA-HYDROXYBUTYRIC ACID - Abnormal; Notable for the following components:   Beta-Hydroxybutyric Acid 0.65 (*)    All other components within normal limits  OSMOLALITY - Abnormal; Notable for the following components:   Osmolality 311 (*)    All other components within normal limits  CK - Abnormal; Notable for the following components:   Total CK 23 (*)    All other components within normal limits  CBG MONITORING, ED - Abnormal; Notable for the following components:   Glucose-Capillary 579 (*)    All other components within normal limits  CBG MONITORING, ED - Abnormal; Notable for the following components:   Glucose-Capillary 418 (*)    All other components within normal  limits     EKG My EKG interpretation: Rate of 62, normal sinus rhythm with occasional PAC.  Left anterior fascicular block.  No acute ST elevations or depressions   RADIOLOGY Independently interpreted CT head, CT C-spine, and pelvic x-ray with no acute traumatic pathology   PROCEDURES:  Critical Care performed: No  Procedures   MEDICATIONS ORDERED IN ED: Medications  sodium chloride  0.9 % bolus 1,000 mL (0 mLs Intravenous Stopped 04/18/24 1121)  insulin  aspart (novoLOG ) injection 10 Units (10 Units Intravenous Given 04/18/24 1035)     IMPRESSION / MDM / ASSESSMENT AND PLAN / ED COURSE  I reviewed the triage vital signs and the nursing notes.                              Differential diagnosis includes, but is not limited to, hyperglycemia, medication noncompliance, HHS, DKA, ICH, cervical spine injury  Patient's presentation is most consistent with acute presentation with potential threat to life or bodily function.  Patient is a 78 year old female presenting today for second visit in the last 4 days related to hyperglycemia.  Was found down on the ground with no memory of how this happened.  Blood glucose on arrival is 579.  Laboratory workup otherwise with no evidence of DKA.  Osmolality is slightly elevated at 311 but not immediately indicative of HHS.  UA negative.  CK normal.  Beta hydroxybutyrate only mildly elevated.  CBC unremarkable.  CT head and C-spine negative as well as pelvic x-ray.  Concerned about sending patient home again giving repeat visit in the past 4 days and unsure care at home for medication administration.  Attempted to call son and husband with no answer.  Discussed case with hospitalist for admission for ongoing rehydration, blood glucose control, and safe disposition home.  Hospitalist will admit patient for further care.  The patient is on the cardiac monitor to evaluate for evidence of arrhythmia and/or significant heart rate changes. Clinical Course  as of 04/18/24 1248  Mon Apr 18, 2024  0957 Osmolality(!): 311 [DW]    Clinical Course User Index [DW] Kandee Orion, MD     FINAL CLINICAL IMPRESSION(S) / ED DIAGNOSES   Final diagnoses:  Hyperglycemia  Memory loss     Rx / DC Orders   ED Discharge Orders  None        Note:  This document was prepared using Dragon voice recognition software and may include unintentional dictation errors.   Kandee Orion, MD 04/18/24 1250

## 2024-04-18 NOTE — ED Notes (Signed)
CBG 579 

## 2024-04-18 NOTE — Assessment & Plan Note (Addendum)
-   Hold home regimen in the setting of orthostatic hypotension

## 2024-04-18 NOTE — ED Notes (Signed)
CBG=418 

## 2024-04-18 NOTE — Assessment & Plan Note (Addendum)
 Patient is presenting with recurrent dizziness leading to ground-level falls without syncope.  She was admitted for the same earlier this month with positive orthostatic vital signs.  She was dizzy on sitting up for examination, and seems notably hypovolemic with dry mucous membranes and AKI.  Likely due to uncontrolled hyperglycemia and memory deficits leading to poor oral intake.  - S/p 1 L bolus - Continue maintenance fluids - Check orthostatic vital signs - PT/OT

## 2024-04-18 NOTE — ED Notes (Signed)
 Pt returned from CT

## 2024-04-18 NOTE — ED Notes (Signed)
 Pt calling out needing to use restroom. Placed on bedpan. Tolerated well.

## 2024-04-18 NOTE — Assessment & Plan Note (Addendum)
 Patient reports she is been having difficulty remembering if she has been taking her medications over the last several days with no focal neurological deficits on examination.  Potentially multifactorial in the setting of hyperglycemia, dehydration.  Although there are leukocytes and rare bacteria on UA, she denies any urinary symptoms and no evidence of elevated WBC.  Given multiple risk factors for intracranial etiology, will obtain an MRI, however low suspicion for acute CVA.  Discussed with patient's daughter, [name].  She notes that family has noticed a decline in patient's memory for the last several months.  Patient's brother was recently diagnosed with Alzheimer's dementia.  - MRI without contrast - Will need outpatient follow-up with neurology for further workup of possible underlying dementia

## 2024-04-18 NOTE — ED Notes (Signed)
 CCMD notified about pt coming to ED 34.

## 2024-04-18 NOTE — ED Notes (Signed)
 Patient transported to MRI

## 2024-04-18 NOTE — ED Triage Notes (Signed)
 Pt BIB [facility] from home after fall. Per EMS, spouse fell sometime overnight and he found her on the floor this AM. CBG HIGH for EMS. EMS gave 500 mL NS en route.

## 2024-04-18 NOTE — Assessment & Plan Note (Signed)
 Patient has a history of previously relatively well-controlled insulin -dependent type 2 diabetes, for which she follows with endocrinology.  Over the last 1 month, blood sugars have been uncontrolled, potentially due to developing memory deficits with lack of support at home.  Will need to discuss with family if they are able to help manage her insulin  regimen.  No evidence of DKA.  Serum osmolality is mildly elevated, however suspect this is more due to dehydration.  No indication for insulin  drip.  - S/p one-time dose IV insulin  - Start SSI, moderate - Semglee  10 units at bedtime - Hold home regimen

## 2024-04-18 NOTE — Assessment & Plan Note (Signed)
 In the setting of poor oral intake and dehydration.  - IV fluids as ordered - Repeat BMP in the a.m. - Hold home nephrotoxic agents

## 2024-04-19 DIAGNOSIS — E669 Obesity, unspecified: Secondary | ICD-10-CM | POA: Diagnosis present

## 2024-04-19 DIAGNOSIS — Z85828 Personal history of other malignant neoplasm of skin: Secondary | ICD-10-CM | POA: Diagnosis not present

## 2024-04-19 DIAGNOSIS — E860 Dehydration: Secondary | ICD-10-CM | POA: Diagnosis present

## 2024-04-19 DIAGNOSIS — Z9221 Personal history of antineoplastic chemotherapy: Secondary | ICD-10-CM | POA: Diagnosis not present

## 2024-04-19 DIAGNOSIS — Z881 Allergy status to other antibiotic agents status: Secondary | ICD-10-CM | POA: Diagnosis not present

## 2024-04-19 DIAGNOSIS — Z96653 Presence of artificial knee joint, bilateral: Secondary | ICD-10-CM | POA: Diagnosis present

## 2024-04-19 DIAGNOSIS — I480 Paroxysmal atrial fibrillation: Secondary | ICD-10-CM | POA: Diagnosis present

## 2024-04-19 DIAGNOSIS — I10 Essential (primary) hypertension: Secondary | ICD-10-CM | POA: Diagnosis present

## 2024-04-19 DIAGNOSIS — R55 Syncope and collapse: Secondary | ICD-10-CM | POA: Diagnosis not present

## 2024-04-19 DIAGNOSIS — E7800 Pure hypercholesterolemia, unspecified: Secondary | ICD-10-CM | POA: Diagnosis present

## 2024-04-19 DIAGNOSIS — Z885 Allergy status to narcotic agent status: Secondary | ICD-10-CM | POA: Diagnosis not present

## 2024-04-19 DIAGNOSIS — Z79890 Hormone replacement therapy (postmenopausal): Secondary | ICD-10-CM | POA: Diagnosis not present

## 2024-04-19 DIAGNOSIS — Z923 Personal history of irradiation: Secondary | ICD-10-CM | POA: Diagnosis not present

## 2024-04-19 DIAGNOSIS — Z880 Allergy status to penicillin: Secondary | ICD-10-CM | POA: Diagnosis not present

## 2024-04-19 DIAGNOSIS — Z7984 Long term (current) use of oral hypoglycemic drugs: Secondary | ICD-10-CM | POA: Diagnosis not present

## 2024-04-19 DIAGNOSIS — Z7901 Long term (current) use of anticoagulants: Secondary | ICD-10-CM | POA: Diagnosis not present

## 2024-04-19 DIAGNOSIS — Z794 Long term (current) use of insulin: Secondary | ICD-10-CM | POA: Diagnosis not present

## 2024-04-19 DIAGNOSIS — I350 Nonrheumatic aortic (valve) stenosis: Secondary | ICD-10-CM | POA: Diagnosis present

## 2024-04-19 DIAGNOSIS — I951 Orthostatic hypotension: Secondary | ICD-10-CM | POA: Diagnosis present

## 2024-04-19 DIAGNOSIS — R0902 Hypoxemia: Secondary | ICD-10-CM | POA: Diagnosis present

## 2024-04-19 DIAGNOSIS — J4489 Other specified chronic obstructive pulmonary disease: Secondary | ICD-10-CM | POA: Diagnosis present

## 2024-04-19 DIAGNOSIS — E1165 Type 2 diabetes mellitus with hyperglycemia: Secondary | ICD-10-CM | POA: Diagnosis present

## 2024-04-19 DIAGNOSIS — Z888 Allergy status to other drugs, medicaments and biological substances status: Secondary | ICD-10-CM | POA: Diagnosis not present

## 2024-04-19 DIAGNOSIS — E063 Autoimmune thyroiditis: Secondary | ICD-10-CM | POA: Diagnosis present

## 2024-04-19 DIAGNOSIS — R413 Other amnesia: Secondary | ICD-10-CM | POA: Diagnosis present

## 2024-04-19 DIAGNOSIS — N179 Acute kidney failure, unspecified: Secondary | ICD-10-CM | POA: Diagnosis present

## 2024-04-19 LAB — BASIC METABOLIC PANEL WITH GFR
Anion gap: 8 (ref 5–15)
BUN: 20 mg/dL (ref 8–23)
CO2: 23 mmol/L (ref 22–32)
Calcium: 8.7 mg/dL — ABNORMAL LOW (ref 8.9–10.3)
Chloride: 104 mmol/L (ref 98–111)
Creatinine, Ser: 0.88 mg/dL (ref 0.44–1.00)
GFR, Estimated: >60 mL/min (ref >60)
Glucose, Bld: 243 mg/dL — ABNORMAL HIGH (ref 70–99)
Potassium: 3.1 mmol/L — ABNORMAL LOW (ref 3.5–5.1)
Sodium: 135 mmol/L (ref 135–145)

## 2024-04-19 LAB — CBC
HCT: 33.7 % — ABNORMAL LOW (ref 36.0–46.0)
Hemoglobin: 11.6 g/dL — ABNORMAL LOW (ref 12.0–15.0)
MCH: 29.5 pg (ref 26.0–34.0)
MCHC: 34.4 g/dL (ref 30.0–36.0)
MCV: 85.8 fL (ref 80.0–100.0)
Platelets: 161 10*3/uL (ref 150–400)
RBC: 3.93 MIL/uL (ref 3.87–5.11)
RDW: 12 % (ref 11.5–15.5)
WBC: 5.8 10*3/uL (ref 4.0–10.5)
nRBC: 0 % (ref 0.0–0.2)

## 2024-04-19 LAB — GLUCOSE, CAPILLARY
Glucose-Capillary: 165 mg/dL — ABNORMAL HIGH (ref 70–99)
Glucose-Capillary: 181 mg/dL — ABNORMAL HIGH (ref 70–99)
Glucose-Capillary: 224 mg/dL — ABNORMAL HIGH (ref 70–99)
Glucose-Capillary: 280 mg/dL — ABNORMAL HIGH (ref 70–99)
Glucose-Capillary: 284 mg/dL — ABNORMAL HIGH (ref 70–99)
Glucose-Capillary: 325 mg/dL — ABNORMAL HIGH (ref 70–99)

## 2024-04-19 MED ORDER — LACTATED RINGERS IV SOLN: INTRAVENOUS | Status: DC

## 2024-04-19 MED ORDER — INSULIN GLARGINE-YFGN 100 UNIT/ML ~~LOC~~ SOLN
15.0000 [IU] | Freq: Every day | SUBCUTANEOUS | Status: DC
  Administered 2024-04-19: 15 [IU] via SUBCUTANEOUS
  Filled 2024-04-19: qty 0.15

## 2024-04-19 NOTE — NC FL2 (Signed)
 [facility]  MEDICAID FL2 LEVEL OF CARE FORM     IDENTIFICATION  Patient Name: Hailey Hawkins Birthdate: 1946/12/28 Sex: female Admission Date (Current Location): 04/18/2024  [place] and [place] Number:  Chiropodist and Address:  [facility], [address], [place], [place] [address]      Provider Number: [id number]  Attending Physician Name and Address:  Lorita Rosa, MD  Relative Name and Phone Number:  Camryn Lampson (Spouse), [phone]    Current Level of Care: SNF Recommended Level of Care: Skilled Nursing Facility Prior Approval Number:    Date Approved/Denied:   PASRR Number: [id number]  Discharge Plan: SNF    Current Diagnoses: Patient Active Problem List   Diagnosis Date Noted   Memory deficits 04/18/2024   AKI (acute kidney injury) (HCC) 04/18/2024   Nocturnal hypoxia 04/18/2024   UTI (urinary tract infection) 04/08/2024   Pre-syncope 04/04/2024   Primary osteoarthritis involving multiple joints 09/23/2023   B12 deficiency 07/07/2023   Paroxysmal atrial fibrillation (HCC) 06/24/2023   Weight loss 03/03/2021   Elevated LFTs 03/03/2021   Nodule of upper lobe of right lung 05/06/2019   Nose dryness 03/21/2019   Abnormal CT of the chest 12/07/2018   Chronic fatigue 12/07/2018   Exertional dyspnea 12/07/2018   PR3 ANCA antibodies present 12/07/2018   Coronary artery disease involving native coronary artery of native heart 11/16/2018   Uncontrolled type 2 diabetes mellitus with hyperglycemia, with long-term current use of insulin  (HCC) 02/18/2018   Iron deficiency anemia 01/14/2018   Hx of adenomatous colonic polyps 03/26/2016   Mild aortic valve stenosis 02/07/2016   Morbid obesity with BMI of 40.0-44.9, adult (HCC) 01/11/2016   SIADH (syndrome of inappropriate ADH production) (HCC) 10/17/2015   Chronic hyponatremia 07/05/2015   Acquired hypothyroidism 03/06/2014   Anemia, unspecified 03/06/2014   Essential  hypertension 03/06/2014   Nontoxic uninodular goiter 03/06/2014   Osteoarthrosis involving lower leg 03/06/2014   Pernicious anemia 03/06/2014   Pure hypercholesterolemia 03/06/2014   Type 2 diabetes mellitus without complication, with long-term current use of insulin  (HCC) 03/06/2014   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    Orientation RESPIRATION BLADDER Height & Weight     Self, Time, Situation, Place  Normal Continent Weight: 74.8 kg Height:  5\' 5"  (165.1 cm)  BEHAVIORAL SYMPTOMS/MOOD NEUROLOGICAL BOWEL NUTRITION STATUS      Continent Diet (Carb modified)  AMBULATORY STATUS COMMUNICATION OF NEEDS Skin   Extensive Assist Verbally Surgical wounds                       Personal Care Assistance Level of Assistance  Dressing, Feeding, Bathing Bathing Assistance: Limited assistance Feeding assistance: Limited assistance Dressing Assistance: Limited assistance     Functional Limitations Info             SPECIAL CARE FACTORS FREQUENCY  PT (By licensed PT), OT (By licensed OT)     PT Frequency: 5 times per week OT Frequency: 5 times per week            Contractures Contractures Info: Not present    Additional Factors Info  Code Status Code Status Info: Full code             Current Medications (04/19/2024):  This is the current hospital active medication list Current Facility-Administered Medications  Medication Dose Route Frequency Provider Last Rate Last Admin   acetaminophen  (TYLENOL ) tablet 650 mg  650 mg Oral Q6H PRN  Avi Body, MD       Or   acetaminophen  (TYLENOL ) suppository 650 mg  650 mg Rectal Q6H PRN Avi Body, MD       albuterol  (PROVENTIL ) (2.5 MG/3ML) 0.083% nebulizer solution 2.5 mg  2.5 mg Inhalation Q4H PRN Avi Body, MD       ALPRAZolam  (XANAX ) tablet 0.5 mg  0.5 mg Oral QHS PRN Basaraba, Iulia, MD   0.5 mg at 04/18/24 2139   apixaban  (ELIQUIS ) tablet 5 mg  5 mg Oral BID Avi Body, MD   5 mg  at 04/19/24 5681   atorvastatin  (LIPITOR) tablet 10 mg  10 mg Oral Daily Basaraba, Iulia, MD   10 mg at 04/19/24 2751   insulin  aspart (novoLOG ) injection 0-15 Units  0-15 Units Subcutaneous TID WC Avi Body, MD   11 Units at 04/19/24 1149   insulin  glargine-yfgn (SEMGLEE ) injection 15 Units  15 Units Subcutaneous QHS Lorita Rosa, MD       lactated ringers  infusion   Intravenous Continuous Lorita Rosa, MD       levothyroxine  (SYNTHROID ) tablet 75 mcg  75 mcg Oral Q0600 Basaraba, Iulia, MD   75 mcg at 04/19/24 7001   ondansetron  (ZOFRAN ) tablet 4 mg  4 mg Oral Q6H PRN Avi Body, MD       Or   ondansetron  (ZOFRAN ) injection 4 mg  4 mg Intravenous Q6H PRN Basaraba, Iulia, MD       polyethylene glycol (MIRALAX  / GLYCOLAX ) packet 17 g  17 g Oral Daily PRN Avi Body, MD       QUEtiapine  (SEROQUEL ) tablet 50 mg  50 mg Oral QHS Basaraba, Iulia, MD   50 mg at 04/18/24 2139   sodium chloride  flush (NS) 0.9 % injection 3 mL  3 mL Intravenous Q12H Avi Body, MD   3 mL at 04/19/24 7494   venlafaxine  XR (EFFEXOR -XR) 24 hr capsule 37.5 mg  37.5 mg Oral Q breakfast Basaraba, Iulia, MD   37.5 mg at 04/19/24 4967   Facility-Administered Medications Ordered in Other Encounters  Medication Dose Route Frequency Provider Last Rate Last Admin   0.9 %  sodium chloride  infusion   Intravenous Continuous Nelda Balsam, NP   Stopped at 03/26/23 1542     Discharge Medications: Please see discharge summary for a list of discharge medications.  Relevant Imaging Results:  Relevant Lab Results:   Additional Information    Alexandra Ice, RN

## 2024-04-19 NOTE — Hospital Course (Signed)
 77yo with h/o afib on Eliquis , refractory HTN, mild AS, stage 1a breast CA s/p chemo/immunotherapy, IDDM, HLD, hypothyroidism, and asthma/COPD who presented on 4/21 with a ground level fall.  She was hydrated and given insulin  for marked hyperglycemia.

## 2024-04-19 NOTE — TOC Progression Note (Signed)
 Transition of Care [facility]) - Progression Note    Patient Details  Name: Hailey Hawkins MRN: [id number] Date of Birth: December 24, 1946  [facility] of [facility]) CM/SW Contact  Alexandra Ice, RN Phone Number: 04/19/2024, 1:31 PM  Clinical Narrative:     Met with patient at bedside to discuss discharge plan, she is agreeable to SNF for rehab, would like to stay in [place] area. PASRR, FL2 and bedsearch completed. Will speak with patient once she receives bed offers to provide her choices for review.        Expected Discharge Plan and Services                                               Social Determinants of Health (SDOH) Interventions SDOH Screenings   Food Insecurity: No Food Insecurity (04/18/2024)  Housing: Low Risk  (04/18/2024)  Transportation Needs: No Transportation Needs (04/18/2024)  Utilities: Not At Risk (04/18/2024)  Financial Resource Strain: Low Risk  (08/28/2023)   Received from [facility]  Social Connections: Moderately Integrated (04/18/2024)  Tobacco Use: Low Risk  (04/18/2024)    Readmission Risk Interventions     No data to display

## 2024-04-19 NOTE — Plan of Care (Signed)
  Problem: Coping: Goal: Ability to adjust to condition or change in health will improve Outcome: Progressing   Problem: Education: Goal: Knowledge of General Education information will improve Description: Including pain rating scale, medication(s)/side effects and non-pharmacologic comfort measures Outcome: Progressing   Problem: Health Behavior/Discharge Planning: Goal: Ability to manage health-related needs will improve Outcome: Progressing   Problem: Clinical Measurements: Goal: Ability to maintain clinical measurements within normal limits will improve Outcome: Progressing Goal: Cardiovascular complication will be avoided Outcome: Progressing   Problem: Activity: Goal: Risk for activity intolerance will decrease Outcome: Progressing   Problem: Nutrition: Goal: Adequate nutrition will be maintained Outcome: Progressing   Problem: Coping: Goal: Level of anxiety will decrease Outcome: Progressing   Problem: Elimination: Goal: Will not experience complications related to bowel motility Outcome: Progressing Goal: Will not experience complications related to urinary retention Outcome: Progressing   Problem: Pain Managment: Goal: General experience of comfort will improve and/or be controlled Outcome: Progressing   Problem: Safety: Goal: Ability to remain free from injury will improve Outcome: Progressing   Problem: Skin Integrity: Goal: Risk for impaired skin integrity will decrease Outcome: Progressing

## 2024-04-19 NOTE — Inpatient Diabetes Management (Signed)
 Inpatient Diabetes Program Recommendations  AACE/ADA: New Consensus Statement on Inpatient Glycemic Control   Target Ranges:  Prepandial:   less than 140 mg/dL      Peak postprandial:   less than 180 mg/dL (1-2 hours)      Critically ill patients:  140 - 180 mg/dL    Latest Reference Range & Units 04/18/24 09:03 04/18/24 11:25 04/18/24 18:01 04/18/24 21:21 04/19/24 00:09 04/19/24 05:26 04/19/24 08:09  Glucose-Capillary 70 - 99 mg/dL 578 (HH) 469 (H) 629 (H) 253 (H) 181 (H) 224 (H) 280 (H)   Review of Glycemic Control  Diabetes history: DM2 Outpatient Diabetes medications: Lantus  20 units at bedtime, Jardiance  25 mg daily, Metformin 1000 mg BID Current orders for Inpatient glycemic control: Semglee  10 units at bedtime, Novolog  0-15 units TID with meals  Inpatient Diabetes Program Recommendations:    Insulin : Please consider increasing Semglee  to 15 units at bedtime, add Novolog  0-5 units at bedtime, and ordering Novolog  3 units TID with meals for meal coverage if patient eats at least 50% of meals.  Thanks, Beacher Limerick, RN, MSN, CDCES Diabetes Coordinator Inpatient Diabetes Program [phone] (Team Pager from 8am to 5pm)

## 2024-04-19 NOTE — Evaluation (Signed)
 Physical Therapy Evaluation Patient Details Name: Hailey Hawkins MRN: [id number] DOB: 1946-11-19 Today's Date: 04/19/2024  History of Present Illness  Pt is a 78 y.o. female presented with syncope. PMH of PAF on Eliquis , refractory HTN, mild aortic stenosis, breast cancer stage Ia s/p chemo/immunotherapy/lumpectomy, IDDM, HLD, hypothyroidism, asthma/COPD, B TKR. Recent admission ~2wks ago for similar. Pt is a 78 y.o. female presented with syncope. PMH of PAF on Eliquis , refractory HTN, mild aortic stenosis, breast cancer stage Ia s/p chemo/immunotherapy/lumpectomy, IDDM, HLD, hypothyroidism, asthma/COPD, B TKR. Recent admission ~2wks ago for similar.   Clinical Impression  Pt admitted with above diagnosis. Pt currently with functional limitations due to the deficits listed below (see PT Problem List). Pt received supine in bed agreeable to PT/OT. Co-eval performed due to recurrent falls, orthostatics, and possible poor tolerance for separate sessions. A&Ox3 and disoriented to time (thinks it is 2020). Reports multiple admissions recently which aligns to EMR stating need for RW with mod-I use but keeps falling and is unsure why.   To date, pt moves slowly, is asleep a lot of session needing increased time to complete activity. Unsure of accuracy of orthostatic BP readings throughout session with ability to transfers to EOB CGA, STS and SPT to <> from [facility] to urinate at [facility]. CGA for [facility] but pt able to complete. Attempts for additional standing BP reading with pt requesting need to return to supine due to dizziness. Pt placed upright in bed with education on need for upright posturing to assist in BP adjustment due to positive orthostatics. Due to recurrent falls and admissions and reports of cognitive changes, PT to recommend skilled PT services < 3 hours/day to address her deficits to maximize functional mobility to PLOF. Pt left in care of OT.   Supine: 156/90 (110), HR: 87 Sitting: 155/123  (133), HR: 93  Standing (post use of BSC): 66/52 (57), HR 106      If plan is discharge home, recommend the following: A little help with walking and/or transfers;A little help with bathing/dressing/bathroom;Assistance with cooking/housework;Assist for transportation;Help with stairs or ramp for entrance   Can travel by private vehicle   No    Equipment Recommendations None recommended by PT  Recommendations for Other Services       Functional Status Assessment Patient has had a recent decline in their functional status and demonstrates the ability to make significant improvements in function in a reasonable and predictable amount of time.     Precautions / Restrictions Precautions Precautions: Fall Recall of Precautions/Restrictions: Intact Precaution/Restrictions Comments: Monitor BP Restrictions Weight Bearing Restrictions Per Provider Order: No      Mobility  Bed Mobility Overal bed mobility: Needs Assistance Bed Mobility: Supine to Sit     Supine to sit: Contact guard, HOB elevated, Used rails     General bed mobility comments: multimodal cuing, no physical support. Increased time. Patient Response: Cooperative  Transfers Overall transfer level: Needs assistance Equipment used: Rolling walker (2 wheels) Transfers: Sit to/from Stand, Bed to chair/wheelchair/BSC Sit to Stand: Contact guard assist   Step pivot transfers: Contact guard assist       General transfer comment: steady completion to <> from [facility]    Ambulation/Gait               General Gait Details: deferred due to dizziness  Stairs            Wheelchair Mobility     Tilt Bed Tilt Bed Patient Response: Cooperative  Modified Rankin (Stroke  Patients Only)       Balance Overall balance assessment: Needs assistance Sitting-balance support: No upper extremity supported, Feet supported Sitting balance-Leahy Scale: Good Sitting balance - Comments: good anterior weight shift  for doffing/donning socks   Standing balance support: Bilateral upper extremity supported, Reliant on assistive device for balance Standing balance-Leahy Scale: Poor Standing balance comment: need for RW due to dizziness                             Pertinent Vitals/Pain Pain Assessment Pain Assessment: Faces Faces Pain Scale: No hurt    Home Living Family/patient expects to be discharged to:: Private residence Living Arrangements: Spouse/significant other Available Help at Discharge: Family Type of Home: House Home Access: Stairs to enter Entrance Stairs-Rails: None Entrance Stairs-Number of Steps: 1 small step   Home Layout: One level Home Equipment: Grab bars - toilet;Grab bars - tub/shower;Cane - single Librarian, academic (2 wheels);Shower seat      Prior Function Prior Level of Function : Independent/Modified Independent             Mobility Comments: recent falls last 3 weeks. reliant on RW use but typically has not needed any AD until recent admissions.       Extremity/Trunk Assessment   Upper Extremity Assessment Upper Extremity Assessment: Defer to OT evaluation    Lower Extremity Assessment Lower Extremity Assessment: Generalized weakness       Communication   Communication Communication: No apparent difficulties    Cognition Arousal: Lethargic Behavior During Therapy: WFL for tasks assessed/performed   PT - Cognitive impairments: Orientation   Orientation impairments: Time                   PT - Cognition Comments: thinks it is 2020 but otherwise A&Ox3 Following commands: Impaired Following commands impaired: Follows one step commands with increased time     Cueing Cueing Techniques: Verbal cues     General Comments      Exercises     Assessment/Plan    PT Assessment Patient needs continued PT services  PT Problem List Decreased strength;Decreased activity tolerance;Decreased balance;Decreased  mobility;Decreased cognition;Decreased knowledge of use of DME;Decreased knowledge of precautions;Cardiopulmonary status limiting activity;Pain       PT Treatment Interventions DME instruction;Gait training;Stair training;Functional mobility training;Therapeutic activities;Therapeutic exercise;Balance training;Patient/family education    PT Goals (Current goals can be found in the Care Plan section)  Acute Rehab PT Goals Patient Stated Goal: to improve dizziness PT Goal Formulation: With patient Time For Goal Achievement: 04/18/24 Potential to Achieve Goals: Fair    Frequency Min 3X/week     Co-evaluation PT/OT/SLP Co-Evaluation/Treatment: Yes Reason for Co-Treatment: Complexity of the patient's impairments (multi-system involvement);For patient/therapist safety PT goals addressed during session: Mobility/safety with mobility;Balance OT goals addressed during session: ADL's and self-care;Proper use of Adaptive equipment and DME       AM-PAC PT "6 Clicks" Mobility  Outcome Measure Help needed turning from your back to your side while in a flat bed without using bedrails?: A Little Help needed moving from lying on your back to sitting on the side of a flat bed without using bedrails?: A Lot Help needed moving to and from a bed to a chair (including a wheelchair)?: A Little Help needed standing up from a chair using your arms (e.g., wheelchair or bedside chair)?: A Little Help needed to walk in hospital room?: A Little Help needed climbing 3-5 steps with a railing? :  A Lot 6 Click Score: 16    End of Session Equipment Utilized During Treatment: Gait belt Activity Tolerance: Treatment limited secondary to medical complications (Comment) (orthostatic) Patient left: in bed;with call bell/phone within reach;with nursing/sitter in room;with bed alarm set Nurse Communication: Mobility status PT Visit Diagnosis: Unsteadiness on feet (R26.81);Other abnormalities of gait and mobility  (R26.89);Muscle weakness (generalized) (M62.81);History of falling (Z91.81);Pain    Time: 0839-0900 PT Time Calculation (min) (ACUTE ONLY): 21 min   Charges:   PT Evaluation $PT Eval Moderate Complexity: 1 Mod   PT General Charges $$ ACUTE PT VISIT: 1 Visit        Marc Senior. Fairly IV, PT, DPT Physical Therapist- [place]  [facility]  04/19/2024, 9:59 AM

## 2024-04-19 NOTE — Progress Notes (Signed)
 Progress Note   Patient: Hailey Hawkins WUJ:[id number] DOB: 06-20-1946 DOA: 04/18/2024     0 DOS: the patient was seen and examined on 04/19/2024   Brief hospital course: 78yo with h/o afib on Eliquis , refractory HTN, mild AS, stage 1a breast CA s/p chemo/immunotherapy, IDDM, HLD, hypothyroidism, and asthma/COPD who presented on 4/21 with a ground level fall.  She was hydrated and given insulin  for marked hyperglycemia.  Assessment and Plan:  Pre-syncope Patient is presenting with recurrent dizziness leading to ground-level falls without syncope.   She was admitted for the same earlier this month with positive orthostatic vital signs and continues to have orthostasis She was found to have AKI - likely due to uncontrolled hyperglycemia and memory deficits leading to poor oral intake Continue maintenance fluids Check orthostatic vital signs again in AM PT/OT consulted   Uncontrolled type 2 diabetes mellitus with hyperglycemia, with long-term current use of insulin   H/o previously relatively well-controlled insulin -dependent type 2 diabetes, for which she follows with endocrinology Over the last 1 month, blood sugars have been uncontrolled, potentially due to developing memory deficits with lack of support at home Will need to discuss with family if they are able to help manage her insulin  regimen S/p one-time dose IV insulin  Start SSI, moderate Semglee  10 units at bedtime -> 15 units Hold Jardiance  for now   Memory deficits Patient reports she is been having difficulty remembering if she has been taking her medications over the last several days with no focal neurological deficits on examination.  Discussed with patient's daughter, [name].  She notes that family has noticed a decline in patient's memory for the last several months.  Patient's brother was recently diagnosed with Alzheimer's dementia. MRI unremarkable Will need outpatient follow-up with neurology for further workup of  possible underlying dementia Continue Alprazolam , Desvenlafaxine, Seroquel    AKI (acute kidney injury), resolved In the setting of poor oral intake and dehydration Resolved with IVF   Nocturnal hypoxia Continue home supplemental oxygen at that time   Paroxysmal atrial fibrillation  Rate controlled with propranolol  Continue Eliquis    Essential hypertension Recurrent issues with orthostatic hypotension Continue Imdur    Acquired hypothyroidism TSH normal Continue home Synthroid          Consultants: PT OT   Procedures: None   Antibiotics: None      Subjective: Reports that she was dizzy after using the bedside commode (BP dropped dramatically, ? Vagal). Really wants to go home today.   She has had this issue for 3 weeks now.  Family prefers that she stay as long as possible.  Her son does not want her to go SNF rehab.  They think home tomorrow would be fine.  Son and daughter are planning to discuss options, possibly moving her in with family.   Objective: Vitals:   04/19/24 1158 04/19/24 1202  BP: 129/86 (!) 126/111  Pulse: 90 96  Resp:    Temp:    SpO2:      Intake/Output Summary (Last 24 hours) at 04/19/2024 1554 Last data filed at 04/18/2024 2204 Gross per 24 hour  Intake 10 ml  Output --  Net 10 ml   Filed Weights   04/18/24 0853  Weight: 74.8 kg    Exam:  General:  Appears calm and comfortable and is in NAD Eyes:  EOMI, normal lids, iris ENT:  grossly normal hearing, lips & tongue, mmm Cardiovascular:  RRR, 3/6 systolic murmur. No LE edema.  Respiratory:   CTA bilaterally with no wheezes/rales/rhonchi.  Normal respiratory effort. Abdomen:  soft, NT, ND Skin:  no rash or induration seen on limited exam Musculoskeletal:  grossly normal tone BUE/BLE, good ROM, no bony abnormality Psychiatric:  grossly normal mood and affect, speech fluent and appropriate, AOx3 Neurologic:  CN 2-12 grossly intact, moves all extremities in coordinated  fashion    Data Reviewed: I have reviewed the patient's lab results since admission.  Pertinent labs for today include:   K+ 3.1, repleted Glucose 243, improved from 579 WBC 5.6 Hgb 11.6 Normal TSH     Family Communication: None present; I spoke with her son by telephone  Disposition: Status is: Inpatient Remains inpatient appropriate because: ongoing orthostasis     Time spent: 50 minutes  Unresulted Labs (From admission, onward)     Start     Ordered   04/20/24 0500  CBC with Differential/Platelet  Tomorrow morning,   R        04/19/24 1307   04/20/24 0500  Basic metabolic panel with GFR  Tomorrow morning,   R        04/19/24 1307             Author: Lorita Rosa, MD 04/19/2024 3:54 PM  For on call review [email or web address].

## 2024-04-19 NOTE — Evaluation (Signed)
 Occupational Therapy Evaluation Patient Details Name: Hailey Hawkins MRN: [id number] DOB: 01/21/1946 Today's Date: 04/19/2024   History of Present Illness   Pt is a 78 y.o. female presented with syncope. PMH of PAF on Eliquis , refractory HTN, mild aortic stenosis, breast cancer stage Ia s/p chemo/immunotherapy/lumpectomy, IDDM, HLD, hypothyroidism, asthma/COPD, B TKR. Recent admission ~2wks ago for similar. Pt is a 78 y.o. female presented with syncope. PMH of PAF on Eliquis , refractory HTN, mild aortic stenosis, breast cancer stage Ia s/p chemo/immunotherapy/lumpectomy, IDDM, HLD, hypothyroidism, asthma/COPD, B TKR. Recent admission ~2wks ago for similar.    Clinical Impressions Pt was seen for OT evaluation and cotx with PT this date. Prior to recent hospital admissions, pt reports being independent in all aspects. Per chart family note questionable cognitive decline over the past few months. Pt lives with her spouse of 60 years and reports using a RW the past 3 weeks 2/2 PRN dizziness and falls. Pt denies difficulties with medication mgt. Pt presents to acute OT demonstrating impaired ADL performance and functional mobility 2/2 impaired cognition, BLE strength, balance, and dizziness with positional changes and notable drop in BP with standing (See OT problem list for additional functional deficits). Pt currently requires CGA for ADL transfers, MIN A for donning R sock with pt able to don L without assist, and tolerated only step pivot to [facility] due to need to urinate. Unable to stand for length of time needed for 2nd standing BP. Initial standing BP 66/52. RN notified. Pt would benefit from skilled OT services to address noted impairments and functional limitations (see below for any additional details) in order to maximize safety and independence while minimizing falls risk and caregiver burden.    If plan is discharge home, recommend the following:   A little help with walking and/or transfers;A  lot of help with bathing/dressing/bathroom;A little help with bathing/dressing/bathroom;Assistance with cooking/housework;Assist for transportation;Help with stairs or ramp for entrance     Functional Status Assessment   Patient has had a recent decline in their functional status and demonstrates the ability to make significant improvements in function in a reasonable and predictable amount of time.     Equipment Recommendations   Other (comment) (defer)     Recommendations for Other Services         Precautions/Restrictions   Precautions Precautions: Fall Recall of Precautions/Restrictions: Intact Precaution/Restrictions Comments: Monitor BP Restrictions Weight Bearing Restrictions Per Provider Order: No     Mobility Bed Mobility Overal bed mobility: Needs Assistance Bed Mobility: Supine to Sit, Sit to Supine     Supine to sit: Contact guard, HOB elevated, Used rails Sit to supine: Supervision   General bed mobility comments: increased time, effort    Transfers Overall transfer level: Needs assistance Equipment used: Rolling walker (2 wheels) Transfers: Sit to/from Stand, Bed to chair/wheelchair/BSC Sit to Stand: Contact guard assist     Step pivot transfers: Contact guard assist            Balance Overall balance assessment: Needs assistance Sitting-balance support: No upper extremity supported, Feet supported Sitting balance-Leahy Scale: Good Sitting balance - Comments: good anterior weight shift for doffing/donning socks   Standing balance support: Bilateral upper extremity supported, Reliant on assistive device for balance Standing balance-Leahy Scale: Poor Standing balance comment: need for RW due to dizziness                           ADL either performed or assessed with  clinical judgement   ADL Overall ADL's : Needs assistance/impaired                     Lower Body Dressing: Sitting/lateral leans;Minimal  assistance Lower Body Dressing Details (indicate cue type and reason): MIN A for R sock while seated EOB Toilet Transfer: Contact guard assist;Rolling walker (2 wheels);BSC/3in1 Toilet Transfer Details (indicate cue type and reason): step pivot Toileting- Clothing Manipulation and Hygiene: Contact guard assist;Sit to/from Horticulturist, commercial         Pertinent Vitals/Pain Pain Assessment Pain Assessment: Faces Faces Pain Scale: No hurt     Extremity/Trunk Assessment Upper Extremity Assessment Upper Extremity Assessment: Overall WFL for tasks assessed   Lower Extremity Assessment Lower Extremity Assessment: Generalized weakness       Communication Communication Communication: No apparent difficulties   Cognition Arousal: Lethargic Behavior During Therapy: WFL for tasks assessed/performed Cognition: No apparent impairments                               Following commands: Impaired Following commands impaired: Follows one step commands with increased time     Cueing  General Comments   Cueing Techniques: Verbal cues  Pt endorses dizziness wiht positional changes; orthostatics taken, appears to be +orthostatic, RN notified   Exercises     Shoulder Instructions      Home Living Family/patient expects to be discharged to:: Private residence Living Arrangements: Spouse/significant other Available Help at Discharge: Family Type of Home: House Home Access: Stairs to enter [employer] of Steps: 1 small step Entrance Stairs-Rails: None Home Layout: One level     Bathroom Shower/Tub: Tub/shower unit;Walk-in shower (uses tub shower)   Bathroom Toilet: Handicapped height     Home Equipment: Grab bars - toilet;Grab bars - tub/shower;Cane - single Librarian, academic (2 wheels);Shower seat          Prior Functioning/Environment Prior Level of Function : Independent/Modified  Independent;Patient poor historian/Family not available             Mobility Comments: recent falls last 3 weeks. reliant on RW use but typically has not needed any AD until recent admissions. ADLs Comments: Pt endorses indep with ADL and IADL including med mgt. No family present to verify and per chart, questionable med mgt independence    OT Problem List: Decreased strength;Decreased activity tolerance;Impaired balance (sitting and/or standing);Pain;Decreased knowledge of use of DME or AE   OT Treatment/Interventions: Self-care/ADL training;Balance training;Therapeutic exercise;Therapeutic activities;Patient/family education;DME and/or AE instruction      OT Goals(Current goals can be found in the care plan section)   Acute Rehab OT Goals Patient Stated Goal: have less dizziness OT Goal Formulation: With patient Time For Goal Achievement: 05/03/24 Potential to Achieve Goals: Good ADL Goals Pt Will Perform Lower Body Dressing: with modified independence;sit to/from stand Pt Will Transfer to Toilet: with supervision;ambulating;regular height toilet (LRAD) Pt Will Perform Toileting - Clothing Manipulation and hygiene: with modified independence Additional ADL Goal #1: Pt will independently utilize learned falls prevention strategies during ADL/mobility efforts, 2/2 opportunities.   OT Frequency:  Min 2X/week    Co-evaluation PT/OT/SLP Co-Evaluation/Treatment: Yes Reason for Co-Treatment: Complexity of the patient's impairments (multi-system involvement);For patient/therapist safety PT goals addressed during session: Mobility/safety with mobility;Balance OT  goals addressed during session: ADL's and self-care;Proper use of Adaptive equipment and DME      AM-PAC OT "6 Clicks" Daily Activity     Outcome Measure Help from another person eating meals?: None Help from another person taking care of personal grooming?: None Help from another person toileting, which includes using  toliet, bedpan, or urinal?: A Little Help from another person bathing (including washing, rinsing, drying)?: A Lot Help from another person to put on and taking off regular upper body clothing?: A Little Help from another person to put on and taking off regular lower body clothing?: A Little 6 Click Score: 19   End of Session Equipment Utilized During Treatment: Rolling walker (2 wheels) Nurse Communication: Mobility status;Other (comment) (BP, dizzy)  Activity Tolerance: Patient tolerated treatment well Patient left: in bed;with call bell/phone within reach;with bed alarm set  OT Visit Diagnosis: Other abnormalities of gait and mobility (R26.89);Muscle weakness (generalized) (M62.81);Dizziness and giddiness (R42)                Time: 8295-6213 OT Time Calculation (min): 30 min Charges:  OT General Charges $OT Visit: 1 Visit OT Evaluation $OT Eval Low Complexity: 1 Low OT Treatments $Self Care/Home Management : 8-22 mins  Berenda Breaker., MPH, MS, OTR/L ascom [phone] 04/19/24, 10:33 AM

## 2024-04-19 NOTE — Care Management Obs Status (Signed)
 MEDICARE OBSERVATION STATUS NOTIFICATION   Patient Details  Name: Hailey Hawkins MRN: [id number] Date of Birth: 1946/02/03   Medicare Observation Status Notification Given:  Rudolph Cost, CMA 04/19/2024, 9:40 AM

## 2024-04-20 DIAGNOSIS — R55 Syncope and collapse: Secondary | ICD-10-CM | POA: Diagnosis not present

## 2024-04-20 LAB — BASIC METABOLIC PANEL WITH GFR
Anion gap: 10 (ref 5–15)
BUN: 15 mg/dL (ref 8–23)
CO2: 23 mmol/L (ref 22–32)
Calcium: 8.6 mg/dL — ABNORMAL LOW (ref 8.9–10.3)
Chloride: 102 mmol/L (ref 98–111)
Creatinine, Ser: 0.75 mg/dL (ref 0.44–1.00)
GFR, Estimated: >60 mL/min (ref >60)
Glucose, Bld: 332 mg/dL — ABNORMAL HIGH (ref 70–99)
Potassium: 3.1 mmol/L — ABNORMAL LOW (ref 3.5–5.1)
Sodium: 135 mmol/L (ref 135–145)

## 2024-04-20 LAB — CBC WITH DIFFERENTIAL/PLATELET
Abs Immature Granulocytes: 0.03 10*3/uL (ref 0.00–0.07)
Basophils Absolute: 0.1 10*3/uL (ref 0.0–0.1)
Basophils Relative: 1 %
Eosinophils Absolute: 0.2 10*3/uL (ref 0.0–0.5)
Eosinophils Relative: 3 %
HCT: 36.5 % (ref 36.0–46.0)
Hemoglobin: 12.8 g/dL (ref 12.0–15.0)
Immature Granulocytes: 0 %
Lymphocytes Relative: 33 %
Lymphs Abs: 2.2 10*3/uL (ref 0.7–4.0)
MCH: 29 pg (ref 26.0–34.0)
MCHC: 35.1 g/dL (ref 30.0–36.0)
MCV: 82.8 fL (ref 80.0–100.0)
Monocytes Absolute: 0.4 10*3/uL (ref 0.1–1.0)
Monocytes Relative: 6 %
Neutro Abs: 3.8 10*3/uL (ref 1.7–7.7)
Neutrophils Relative %: 57 %
Platelets: 196 10*3/uL (ref 150–400)
RBC: 4.41 MIL/uL (ref 3.87–5.11)
RDW: 12.1 % (ref 11.5–15.5)
WBC: 6.7 10*3/uL (ref 4.0–10.5)
nRBC: 0 % (ref 0.0–0.2)

## 2024-04-20 LAB — GLUCOSE, CAPILLARY
Glucose-Capillary: 326 mg/dL — ABNORMAL HIGH (ref 70–99)
Glucose-Capillary: 376 mg/dL — ABNORMAL HIGH (ref 70–99)
Glucose-Capillary: 251 mg/dL — ABNORMAL HIGH (ref 70–99)
Glucose-Capillary: 270 mg/dL — ABNORMAL HIGH (ref 70–99)
Glucose-Capillary: 297 mg/dL — ABNORMAL HIGH (ref 70–99)

## 2024-04-20 MED ORDER — HYDRALAZINE HCL 20 MG/ML IJ SOLN
10.0000 mg | Freq: Four times a day (QID) | INTRAMUSCULAR | Status: DC | PRN
Start: 2024-04-20 — End: 2024-04-23
  Administered 2024-04-20 – 2024-04-22 (×5): 10 mg via INTRAVENOUS
  Filled 2024-04-20 (×5): qty 1

## 2024-04-20 MED ORDER — INSULIN GLARGINE-YFGN 100 UNIT/ML ~~LOC~~ SOLN
20.0000 [IU] | Freq: Every day | SUBCUTANEOUS | Status: DC
  Administered 2024-04-20: 20 [IU] via SUBCUTANEOUS
  Filled 2024-04-20 (×2): qty 0.2

## 2024-04-20 MED ORDER — INSULIN ASPART 100 UNIT/ML IJ SOLN
0.0000 [IU] | Freq: Every day | INTRAMUSCULAR | Status: DC
  Administered 2024-04-20: 3 [IU] via SUBCUTANEOUS
  Filled 2024-04-20: qty 1

## 2024-04-20 MED ORDER — MIDODRINE HCL 5 MG PO TABS: 10.0000 mg | ORAL_TABLET | Freq: Three times a day (TID) | ORAL | Status: DC

## 2024-04-20 MED ORDER — INSULIN ASPART 100 UNIT/ML IJ SOLN
4.0000 [IU] | Freq: Three times a day (TID) | INTRAMUSCULAR | Status: DC
  Administered 2024-04-20 – 2024-04-21 (×4): 4 [IU] via SUBCUTANEOUS
  Filled 2024-04-20 (×4): qty 1

## 2024-04-20 MED ORDER — POTASSIUM CHLORIDE CRYS ER 20 MEQ PO TBCR
40.0000 meq | EXTENDED_RELEASE_TABLET | ORAL | Status: AC
  Administered 2024-04-20 (×2): 40 meq via ORAL
  Filled 2024-04-20 (×2): qty 2

## 2024-04-20 MED ORDER — EMPAGLIFLOZIN 25 MG PO TABS
25.0000 mg | ORAL_TABLET | Freq: Every day | ORAL | Status: DC
  Administered 2024-04-20 – 2024-04-23 (×4): 25 mg via ORAL
  Filled 2024-04-20 (×4): qty 1

## 2024-04-20 MED ORDER — INSULIN ASPART 100 UNIT/ML IJ SOLN
0.0000 [IU] | Freq: Three times a day (TID) | INTRAMUSCULAR | Status: DC
  Administered 2024-04-20: 8 [IU] via SUBCUTANEOUS
  Administered 2024-04-20: 15 [IU] via SUBCUTANEOUS
  Administered 2024-04-21: 11 [IU] via SUBCUTANEOUS
  Administered 2024-04-21: 5 [IU] via SUBCUTANEOUS
  Administered 2024-04-21: 2 [IU] via SUBCUTANEOUS
  Administered 2024-04-22: 5 [IU] via SUBCUTANEOUS
  Administered 2024-04-22 – 2024-04-23 (×3): 3 [IU] via SUBCUTANEOUS
  Filled 2024-04-20 (×8): qty 1

## 2024-04-20 NOTE — Progress Notes (Signed)
 Physical Therapy Treatment Patient Details Name: Hailey Hawkins MRN: [id number] DOB: 10-12-1946 Today's Date: 04/20/2024   History of Present Illness Pt is a 78 y.o. female presented with syncope. PMH of PAF on Eliquis , refractory HTN, mild aortic stenosis, breast cancer stage Ia s/p chemo/immunotherapy/lumpectomy, IDDM, HLD, hypothyroidism, asthma/COPD, B TKR. Recent admission ~2wks ago for similar. Pt is a 78 y.o. female presented with syncope. PMH of PAF on Eliquis , refractory HTN, mild aortic stenosis, breast cancer stage Ia s/p chemo/immunotherapy/lumpectomy, IDDM, HLD, hypothyroidism, asthma/COPD, B TKR. Recent admission ~2wks ago for similar.    PT Comments  Pt received upright in bed agreeable to PT services. Reports feeling improved dizziness from yesterday. Was able to ambulate per NT and pt to <> from bathroom and perform toileting without issue. Pt continues to not need physical assist for all mobility this date, but remains positive with orthostatics with standing. Improves quickly with seated rest. Performed 1x40' in room to chair near doorway in room with excellent use of RW and step through cadence but endorse need to sit due to dizziness. After 30 sec pt stands and ambulates additional 40' to recliner. In recliner education in provided on self care/home mgmt with discussion on strategies to limit falls risk if pt returned home (I.e. using a rollator for a portable seat/RW as needed for household and community, having chairs around house in strategic locations for sitting rest as needed at home, and adequate lighting for ambulating to bathroom in the middle oif the night). Provided this education over the phone to the son as well post PT session. Pt reports need for BM and completes 2x40' in room and indep with passing BM, STS, pericare, and hand hygiene returning to bed but still endorsing upwards to 7/10 Dizziness. Pt placed upright in bed with all needs in reach. Pt progressing but  still recommend skilled PT services upon d/c due to recurrent falls/admissions, and ongoing orthostasis and lack of 24/7 support.   Orthostatics:  Supine: 163/97 mm Hg (118), HR: 103 BPM  Sitting: 162/115 mm Hg (130), HR: 121 BPM  Standing: 95/70 mm Hg, (78), HR: 117 BPM     If plan is discharge home, recommend the following: A little help with walking and/or transfers;A little help with bathing/dressing/bathroom;Assistance with cooking/housework;Assist for transportation;Help with stairs or ramp for entrance   Can travel by private vehicle     Yes  Equipment Recommendations  None recommended by PT    Recommendations for Other Services       Precautions / Restrictions Precautions Precautions: Fall Recall of Precautions/Restrictions: Intact Precaution/Restrictions Comments: Monitor BP Restrictions Weight Bearing Restrictions Per Provider Order: No     Mobility  Bed Mobility Overal bed mobility: Modified Independent Bed Mobility: Supine to Sit, Sit to Supine     Supine to sit: Modified independent (Device/Increase time) Sit to supine: Modified independent (Device/Increase time)     Patient Response: Cooperative  Transfers Overall transfer level: Needs assistance Equipment used: Rolling walker (2 wheels) Transfers: Sit to/from Stand Sit to Stand: Supervision                Ambulation/Gait Ambulation/Gait assistance: Contact guard assist Gait Distance (Feet): 80 Feet (2x40' with bathroom break and seated rest in chair b/t bouts.) Assistive device: Rolling walker (2 wheels) Gait Pattern/deviations: Step-through pattern           Stairs             Wheelchair Mobility     Tilt Bed Tilt Bed  Patient Response: Cooperative  Modified Rankin (Stroke Patients Only)       Balance Overall balance assessment: Needs assistance Sitting-balance support: No upper extremity supported, Feet supported Sitting balance-Leahy Scale: Good     Standing  balance support: No upper extremity supported Standing balance-Leahy Scale: Fair Standing balance comment: stands with light UE support on RW                            Communication    Cognition Arousal: Alert Behavior During Therapy: [facility] for tasks assessed/performed                                    Cueing    Exercises Other Exercises Other Exercises: Discussed with pt and son via telephone on falls risk mitigation strategies, benefits of 24/7 supervision.    General Comments        Pertinent Vitals/Pain Pain Assessment Pain Assessment: No/denies pain    Home Living                          Prior Function            PT Goals (current goals can now be found in the care plan section) Acute Rehab PT Goals Patient Stated Goal: to improve dizziness Time For Goal Achievement: 04/18/24 Potential to Achieve Goals: Fair Progress towards PT goals: Progressing toward goals    Frequency    Min 3X/week      PT Plan      Co-evaluation              AM-PAC PT "6 Clicks" Mobility   Outcome Measure  Help needed turning from your back to your side while in a flat bed without using bedrails?: A Little Help needed moving from lying on your back to sitting on the side of a flat bed without using bedrails?: A Little Help needed moving to and from a bed to a chair (including a wheelchair)?: A Little Help needed standing up from a chair using your arms (e.g., wheelchair or bedside chair)?: A Little Help needed to walk in hospital room?: A Little Help needed climbing 3-5 steps with a railing? : A Little 6 Click Score: 18    End of Session Equipment Utilized During Treatment: Gait belt Activity Tolerance: Patient tolerated treatment well Patient left: in bed;with call bell/phone within reach;with nursing/sitter in room;with bed alarm set Nurse Communication: Mobility status PT Visit Diagnosis: Unsteadiness on feet (R26.81);Other  abnormalities of gait and mobility (R26.89);Muscle weakness (generalized) (M62.81);History of falling (Z91.81);Pain     Time: 1610-9604 PT Time Calculation (min) (ACUTE ONLY): 38 min  Charges:    $Therapeutic Activity: 23-37 mins $Self Care/Home Management: 8-22 PT General Charges $$ ACUTE PT VISIT: 1 Visit                     Marc Senior. Fairly IV, PT, DPT Physical Therapist- [place]  [facility]  04/20/2024, 2:14 PM

## 2024-04-20 NOTE — Inpatient Diabetes Management (Signed)
 Inpatient Diabetes Program Recommendations  AACE/ADA: New Consensus Statement on Inpatient Glycemic Control   Target Ranges:  Prepandial:   less than 140 mg/dL      Peak postprandial:   less than 180 mg/dL (1-2 hours)      Critically ill patients:  140 - 180 mg/dL    Latest Reference Range & Units 04/19/24 08:09 04/19/24 11:44 04/19/24 16:59 04/19/24 21:18 04/20/24 08:03  Glucose-Capillary 70 - 99 mg/dL 409 (H) 811 (H) 914 (H) 284 (H) 326 (H)   Review of Glycemic Control  Diabetes history: DM2 Outpatient Diabetes medications: Lantus  20 units at bedtime, Jardiance  25 mg daily, Metformin 1000 mg BID Current orders for Inpatient glycemic control: Semglee  15 units at bedtime, Novolog  0-15 units TID with meals   Inpatient Diabetes Program Recommendations:     Insulin : Please consider increasing Semglee  to 20 units at bedtime, adding Novolog  0-5 units at bedtime, and ordering Novolog  4 units TID with meals for meal coverage if patient eats at least 50% of meals.   Thanks, Beacher Limerick, RN, MSN, CDCES Diabetes Coordinator Inpatient Diabetes Program [phone] (Team Pager from 8am to 5pm)

## 2024-04-20 NOTE — Plan of Care (Signed)
  Problem: Coping: Goal: Ability to adjust to condition or change in health will improve Outcome: Progressing   Problem: Fluid Volume: Goal: Ability to maintain a balanced intake and output will improve Outcome: Progressing   Problem: Health Behavior/Discharge Planning: Goal: Ability to identify and utilize available resources and services will improve Outcome: Progressing Goal: Ability to manage health-related needs will improve Outcome: Progressing   Problem: Metabolic: Goal: Ability to maintain appropriate glucose levels will improve Outcome: Progressing   Problem: Nutritional: Goal: Maintenance of adequate nutrition will improve Outcome: Progressing   Problem: Skin Integrity: Goal: Risk for impaired skin integrity will decrease Outcome: Progressing   Problem: Tissue Perfusion: Goal: Adequacy of tissue perfusion will improve Outcome: Progressing   Problem: Education: Goal: Knowledge of General Education information will improve Description: Including pain rating scale, medication(s)/side effects and non-pharmacologic comfort measures Outcome: Progressing   Problem: Clinical Measurements: Goal: Ability to maintain clinical measurements within normal limits will improve Outcome: Progressing Goal: Cardiovascular complication will be avoided Outcome: Progressing   Problem: Activity: Goal: Risk for activity intolerance will decrease Outcome: Progressing   Problem: Nutrition: Goal: Adequate nutrition will be maintained Outcome: Progressing   Problem: Coping: Goal: Level of anxiety will decrease Outcome: Progressing   Problem: Elimination: Goal: Will not experience complications related to bowel motility Outcome: Progressing Goal: Will not experience complications related to urinary retention Outcome: Progressing   Problem: Pain Managment: Goal: General experience of comfort will improve and/or be controlled Outcome: Progressing   Problem: Safety: Goal:  Ability to remain free from injury will improve Outcome: Progressing   Problem: Skin Integrity: Goal: Risk for impaired skin integrity will decrease Outcome: Progressing

## 2024-04-20 NOTE — Progress Notes (Signed)
 Progress Note   Patient: Hailey Hawkins ZOX:[id number] DOB: October 30, 1946 DOA: 04/18/2024     1 DOS: the patient was seen and examined on 04/20/2024   Brief hospital course: 78yo with h/o afib on Eliquis , refractory HTN, mild AS, stage 1a breast CA s/p chemo/immunotherapy, IDDM, HLD, hypothyroidism, and asthma/COPD who presented on 4/21 with a ground level fall.  She was hydrated and given insulin  for marked hyperglycemia.  Assessment and Plan:  Pre-syncope/orthostatic hypotension Patient is presenting with recurrent dizziness leading to ground-level falls without syncope.   She was admitted for the same earlier this month with positive orthostatic vital signs and continues to have orthostasis She was found to have AKI - likely due to uncontrolled hyperglycemia and memory deficits leading to poor oral intake AKI is resolved but she remains frankly orthostatic Check orthostatic vital signs q12h PT/OT consulted Will add ted hose and abdominal binder If not improving, will add midodrine  - although since he high BPs at baseline, this is suboptimal   Uncontrolled type 2 diabetes mellitus with hyperglycemia, with long-term current use of insulin   H/o previously relatively well-controlled insulin -dependent type 2 diabetes, for which she follows with endocrinology Over the last 1 month, blood sugars have been uncontrolled, potentially due to developing memory deficits with lack of support at home Will need to discuss with family if they are able to help manage her insulin  regimen S/p one-time dose IV insulin  Start SSI, moderate Semglee  10 units at bedtime -> 15 units -> 20 units Resume Jardiance     Memory deficits Patient reports she is been having difficulty remembering if she has been taking her medications over the last several days with no focal neurological deficits on examination.  Patient's daughter, Hailey Hawkins, notes that family has noticed a decline in patient's memory for the last several  months Patient's brother was recently diagnosed with Alzheimer's dementia. MRI unremarkable Will need outpatient follow-up with neurology for further workup of possible underlying dementia Continue Alprazolam , Desvenlafaxine, Seroquel    AKI (acute kidney injury), resolved In the setting of poor oral intake and dehydration Resolved with IVF   Nocturnal hypoxia Continue home supplemental oxygen at that time   Paroxysmal atrial fibrillation  Rate controlled with propranolol  Continue Eliquis    Essential hypertension Recurrent issues with orthostatic hypotension Continue Imdur    Acquired hypothyroidism TSH normal Continue home Synthroid    Disposition Family is uncertain about SNF - son says absolutely not but her daughter may feel differently With continued orthostatic hypotension, she needs ongoing monitoring (here vs. SNF) If this improves, she may be able to go home with supervision Family will need to be very involved Will continue to discuss recommendations        Consultants: PT OT   Procedures: None   Antibiotics: None  30 Day Unplanned Readmission Risk Score    Flowsheet Row ED to Hosp-Admission (Current) from 04/18/2024 in [facility] (1A)  30 Day Unplanned Readmission Risk Score (%) 19.38 Filed at 04/20/2024 1201       This score is the patient's risk of an unplanned readmission within 30 days of being discharged (0 -100%). The score is based on dignosis, age, lab data, medications, orders, and past utilization.   Low:  0-14.9   Medium: 15-21.9   High: 22-29.9   Extreme: 30 and above           Subjective: Continues to feel dizzy with standing.   Objective: Vitals:   04/20/24 0657 04/20/24 0802  BP: (!) 155/75 Aaron Aas)  156/97  Pulse: 93 (!) 56  Resp:  18  Temp:    SpO2:  97%    Intake/Output Summary (Last 24 hours) at 04/20/2024 1504 Last data filed at 04/20/2024 1042 Gross per 24 hour  Intake 240 ml  Output --   Net 240 ml   Filed Weights   04/18/24 0853  Weight: 74.8 kg    Exam:  General:  Appears calm and comfortable and is in NAD Eyes:  EOMI, normal lids, iris ENT:  grossly normal hearing, lips & tongue, mmm Cardiovascular:  RRR, 3/6 systolic murmur. No LE edema.  Respiratory:   CTA bilaterally with no wheezes/rales/rhonchi.  Normal respiratory effort. Abdomen:  soft, NT, ND Skin:  no rash or induration seen on limited exam Musculoskeletal:  grossly normal tone BUE/BLE, good ROM, no bony abnormality Psychiatric:  grossly normal mood and affect, speech fluent and appropriate, AOx3 Neurologic:  CN 2-12 grossly intact, moves all extremities in coordinated fashion  Data Reviewed: I have reviewed the patient's lab results since admission.  Pertinent labs for today include:   K+ 3.1 Glucose 332 Normal CBC     Family Communication: None present; unable to reach her daughter by telephone; I spoke with her son by telephone  Disposition: Status is: Inpatient Remains inpatient appropriate because: ongoing monitoring     Time spent: 50 minutes  Unresulted Labs (From admission, onward)     Start     Ordered   04/21/24 0500  Basic metabolic panel with GFR  Tomorrow morning,   R        04/20/24 1503             Author: Lorita Rosa, MD 04/20/2024 3:04 PM  For on call review [email or web address].

## 2024-04-20 NOTE — Progress Notes (Signed)
 Occupational Therapy Treatment Patient Details Name: Hailey Hawkins MRN: [id number] DOB: Aug 27, 1946 Today's Date: 04/20/2024   History of present illness Pt is a 78 y.o. female presented with syncope. PMH of PAF on Eliquis , refractory HTN, mild aortic stenosis, breast cancer stage Ia s/p chemo/immunotherapy/lumpectomy, IDDM, HLD, hypothyroidism, asthma/COPD, B TKR. Recent admission ~2wks ago for similar. Pt is a 78 y.o. female presented with syncope. PMH of PAF on Eliquis , refractory HTN, mild aortic stenosis, breast cancer stage Ia s/p chemo/immunotherapy/lumpectomy, IDDM, HLD, hypothyroidism, asthma/COPD, B TKR. Recent admission ~2wks ago for similar.   OT comments  Hailey Hawkins was seen for OT treatment on this date. Responded to bed alarm, upon arrival to room pt seated EOB, agreeable to tx. Pt requires CGA + RW for toilet t/f decreasing to MIN A as pt abandons RW and reports increased dizziness. SUPERVISION for pericare seated. MIN A don B socks in sitting. Unable to tolerate standing for orthostatics, will plan to assess next session, NT/RN notified of suspected ongoing orthostatics. Pt with poor safety awareness Pt making good progress toward goals, will continue to follow POC. Discharge recommendation remains appropriate.        If plan is discharge home, recommend the following:  A little help with walking and/or transfers;A lot of help with bathing/dressing/bathroom;A little help with bathing/dressing/bathroom;Assistance with cooking/housework;Assist for transportation;Help with stairs or ramp for entrance   Equipment Recommendations  BSC/3in1;Other (comment) (RW)    Recommendations for Other Services      Precautions / Restrictions Precautions Precautions: Fall Recall of Precautions/Restrictions: Intact Precaution/Restrictions Comments: Monitor BP Restrictions Weight Bearing Restrictions Per Provider Order: No       Mobility Bed Mobility Overal bed mobility: Modified  Independent                  Transfers Overall transfer level: Needs assistance Equipment used: Rolling walker (2 wheels) Transfers: Sit to/from Stand Sit to Stand: Contact guard assist                 Balance Overall balance assessment: Needs assistance Sitting-balance support: No upper extremity supported, Feet supported Sitting balance-Leahy Scale: Good     Standing balance support: No upper extremity supported Standing balance-Leahy Scale: Poor                             ADL either performed or assessed with clinical judgement   ADL Overall ADL's : Needs assistance/impaired                                       General ADL Comments: CGA + RW for toilet t/f decreasing to MIN A as pt abandons RW and reports increased dizziness. SUPERVISION for pericare seated. MIN A don B socks in sitting     Communication Communication Communication: No apparent difficulties   Cognition Arousal: Alert Behavior During Therapy: WFL for tasks assessed/performed Cognition: No apparent impairments                               Following commands: Intact        Cueing   Cueing Techniques: Verbal cues             Pertinent Vitals/ Pain       Pain Assessment Pain Assessment: No/denies pain   Frequency  Min  2X/week        Progress Toward Goals  OT Goals(current goals can now be found in the care plan section)  Progress towards OT goals: Progressing toward goals  Acute Rehab OT Goals OT Goal Formulation: With patient Time For Goal Achievement: 05/03/24 Potential to Achieve Goals: Good ADL Goals Pt Will Perform Lower Body Dressing: with modified independence;sit to/from stand Pt Will Transfer to Toilet: with supervision;ambulating;regular height toilet Pt Will Perform Toileting - Clothing Manipulation and hygiene: with modified independence Additional ADL Goal #1: Pt will independently utilize learned falls  prevention strategies during ADL/mobility efforts, 2/2 opportunities.  Plan      Co-evaluation                 AM-PAC OT "6 Clicks" Daily Activity     Outcome Measure   Help from another person eating meals?: None Help from another person taking care of personal grooming?: A Little Help from another person toileting, which includes using toliet, bedpan, or urinal?: A Little Help from another person bathing (including washing, rinsing, drying)?: A Lot Help from another person to put on and taking off regular upper body clothing?: A Little Help from another person to put on and taking off regular lower body clothing?: A Little 6 Click Score: 18    End of Session Equipment Utilized During Treatment: Rolling walker (2 wheels)  OT Visit Diagnosis: Other abnormalities of gait and mobility (R26.89);Muscle weakness (generalized) (M62.81);Dizziness and giddiness (R42)   Activity Tolerance Patient tolerated treatment well   Patient Left in chair;with call bell/phone within reach;with chair alarm set   Nurse Communication Mobility status        Time: 4098-1191 OT Time Calculation (min): 13 min  Charges: OT General Charges $OT Visit: 1 Visit OT Treatments $Self Care/Home Management : 8-22 mins  Gordan Latina, M.S. OTR/L  04/20/24, 10:28 AM  ascom [phone]

## 2024-04-21 DIAGNOSIS — R55 Syncope and collapse: Secondary | ICD-10-CM | POA: Diagnosis not present

## 2024-04-21 LAB — BASIC METABOLIC PANEL WITH GFR
Anion gap: 14 (ref 5–15)
BUN: 20 mg/dL (ref 8–23)
CO2: 23 mmol/L (ref 22–32)
Calcium: 9.1 mg/dL (ref 8.9–10.3)
Chloride: 102 mmol/L (ref 98–111)
Creatinine, Ser: 0.75 mg/dL (ref 0.44–1.00)
GFR, Estimated: >60 mL/min (ref >60)
Glucose, Bld: 215 mg/dL — ABNORMAL HIGH (ref 70–99)
Potassium: 3.5 mmol/L (ref 3.5–5.1)
Sodium: 139 mmol/L (ref 135–145)

## 2024-04-21 LAB — GLUCOSE, CAPILLARY
Glucose-Capillary: 228 mg/dL — ABNORMAL HIGH (ref 70–99)
Glucose-Capillary: 308 mg/dL — ABNORMAL HIGH (ref 70–99)
Glucose-Capillary: 121 mg/dL — ABNORMAL HIGH (ref 70–99)
Glucose-Capillary: 103 mg/dL — ABNORMAL HIGH (ref 70–99)

## 2024-04-21 MED ORDER — INSULIN GLARGINE-YFGN 100 UNIT/ML ~~LOC~~ SOLN
25.0000 [IU] | Freq: Every day | SUBCUTANEOUS | Status: DC
  Administered 2024-04-21 – 2024-04-22 (×2): 25 [IU] via SUBCUTANEOUS
  Filled 2024-04-21 (×3): qty 0.25

## 2024-04-21 MED ORDER — INSULIN ASPART 100 UNIT/ML IJ SOLN
6.0000 [IU] | Freq: Three times a day (TID) | INTRAMUSCULAR | Status: DC
Start: 2024-04-21 — End: 2024-04-23
  Administered 2024-04-21 – 2024-04-23 (×5): 6 [IU] via SUBCUTANEOUS
  Filled 2024-04-21 (×6): qty 1

## 2024-04-21 NOTE — Plan of Care (Signed)
  Problem: Health Behavior/Discharge Planning: Goal: Ability to manage health-related needs will improve Outcome: Progressing   Problem: Nutritional: Goal: Maintenance of adequate nutrition will improve Outcome: Progressing   

## 2024-04-21 NOTE — Progress Notes (Signed)
 Physical Therapy Treatment Patient Details Name: Hailey Hawkins MRN: [id number] DOB: 12-07-1946 Today's Date: 04/21/2024   History of Present Illness Pt is a 78 y.o. female presented with syncope. PMH of PAF on Eliquis , refractory HTN, mild aortic stenosis, breast cancer stage Ia s/p chemo/immunotherapy/lumpectomy, IDDM, HLD, hypothyroidism, asthma/COPD, B TKR. Recent admission ~2wks ago for similar. Pt is a 78 y.o. female presented with syncope. PMH of PAF on Eliquis , refractory HTN, mild aortic stenosis, breast cancer stage Ia s/p chemo/immunotherapy/lumpectomy, IDDM, HLD, hypothyroidism, asthma/COPD, B TKR. Recent admission ~2wks ago for similar.    PT Comments  Pt received supine in bed agreeable to PT. Ted hose donned but no sign of abdominal binder in room. Educated pt on benefits/purpose of utilizing ted hose and abd binder for BP support in dependent positions. Despite ted hose pt remains orthostatic but in general continues to move well and safely using RW at supervision levels completing similar household distances as yesterday's session. BP drop is still significant for positive orthostasis however standing readings are improved from prior orthostatics. Pt returned to recliner with BP taken sitting (146/107, HR:129). D/c recs remain appropriate.   Orthostatic VS for the past 24 hrs:  BP- Lying Pulse- Lying BP- Sitting Pulse- Sitting BP- Standing at 0 minutes Pulse- Standing at 0 minutes  04/21/24 1100 161/89 118 (!) 132/106 123 119/63 117  04/21/24 0757 (!) 169/106 87 (!) 142/96 71 (!) 75/63 134       If plan is discharge home, recommend the following: A little help with walking and/or transfers;A little help with bathing/dressing/bathroom;Assistance with cooking/housework;Assist for transportation;Help with stairs or ramp for entrance   Can travel by private vehicle     Yes  Equipment Recommendations  None recommended by PT    Recommendations for Other Services        Precautions / Restrictions Precautions Precautions: Fall Recall of Precautions/Restrictions: Intact Restrictions Weight Bearing Restrictions Per Provider Order: No     Mobility  Bed Mobility Overal bed mobility: Modified Independent               Patient Response: Cooperative  Transfers Overall transfer level: Needs assistance Equipment used: Rolling walker (2 wheels) Transfers: Sit to/from Stand Sit to Stand: Supervision                Ambulation/Gait Ambulation/Gait assistance: Supervision Gait Distance (Feet): 70 Feet (seated bathroom break during gait bout) Assistive device: Rolling walker (2 wheels) Gait Pattern/deviations: Step-through pattern, WFL(Within Functional Limits)           Stairs             Wheelchair Mobility     Tilt Bed Tilt Bed Patient Response: Cooperative  Modified Rankin (Stroke Patients Only)       Balance Overall balance assessment: Mild deficits observed, not formally tested                                          Communication Communication Communication: No apparent difficulties  Cognition Arousal: Alert Behavior During Therapy: WFL for tasks assessed/performed                                    Cueing    Exercises Other Exercises Other Exercises: education on purposes of ted hose and abd binder    General Comments  Pertinent Vitals/Pain Pain Assessment Pain Assessment: No/denies pain    Home Living                          Prior Function            PT Goals (current goals can now be found in the care plan section) Acute Rehab PT Goals Patient Stated Goal: to improve dizziness PT Goal Formulation: With patient Time For Goal Achievement: 04/18/24 Potential to Achieve Goals: Fair Progress towards PT goals: Progressing toward goals    Frequency           PT Plan      Co-evaluation              AM-PAC PT "6 Clicks"  Mobility   Outcome Measure  Help needed turning from your back to your side while in a flat bed without using bedrails?: A Little Help needed moving from lying on your back to sitting on the side of a flat bed without using bedrails?: A Little Help needed moving to and from a bed to a chair (including a wheelchair)?: A Little Help needed standing up from a chair using your arms (e.g., wheelchair or bedside chair)?: A Little Help needed to walk in hospital room?: A Little Help needed climbing 3-5 steps with a railing? : A Little 6 Click Score: 18    End of Session Equipment Utilized During Treatment: Gait belt Activity Tolerance: Patient tolerated treatment well Patient left: in chair;with call bell/phone within reach;with chair alarm set Nurse Communication: Mobility status PT Visit Diagnosis: Unsteadiness on feet (R26.81);Other abnormalities of gait and mobility (R26.89);Muscle weakness (generalized) (M62.81);History of falling (Z91.81);Pain     Time: 1610-9604 PT Time Calculation (min) (ACUTE ONLY): 28 min  Charges:    $Therapeutic Activity: 23-37 mins PT General Charges $$ ACUTE PT VISIT: 1 Visit                     Marc Senior. Fairly IV, PT, DPT Physical Therapist- [place]  [facility]  04/21/2024, 11:54 AM

## 2024-04-21 NOTE — Progress Notes (Signed)
 Progress Note   Patient: Hailey Hawkins ZOX:[id number] DOB: 1946/06/04 DOA: 04/18/2024     2 DOS: the patient was seen and examined on 04/21/2024   Brief hospital course: 78yo with h/o afib on Eliquis , refractory HTN, mild AS, stage 1a breast CA s/p chemo/immunotherapy, IDDM, HLD, hypothyroidism, and asthma/COPD who presented on 4/21 with a ground level fall.  She was hydrated and given insulin  for marked hyperglycemia.  Assessment and Plan:  Pre-syncope/orthostatic hypotension Patient is presenting with recurrent dizziness leading to ground-level falls without syncope She was admitted for the same earlier this month with positive orthostatic vital signs and continues to have orthostasis She was found to have AKI - likely due to uncontrolled hyperglycemia and memory deficits leading to poor oral intake AKI is resolved but she remains frankly orthostatic Check orthostatic vital signs q12h PT/OT consulted Added ted hose and abdominal binder BP currently 156/103, orthostatic 119/63 which is markedly improved If not improving, could add midodrine  - although since he high BPs at baseline, this is suboptimal Could also add fludrocortisone although her fall risk is high   Uncontrolled type 2 diabetes mellitus with hyperglycemia, with long-term current use of insulin   H/o previously relatively well-controlled insulin -dependent type 2 diabetes, for which she follows with endocrinology Over the last 1 month, blood sugars have been uncontrolled, potentially due to developing memory deficits with lack of support at home Will need to discuss with family if they are able to help manage her insulin  regimen S/p one-time dose IV insulin  Start SSI, moderate Semglee  10 units at bedtime -> 15 units -> 20 units -> 25 units Resume Jardiance     Memory deficits Patient reports she is been having difficulty remembering if she has been taking her medications over the last several days with no focal  neurological deficits on examination.  Patient's daughter, [name], notes that family has noticed a decline in patient's memory for the last several months Patient's brother was recently diagnosed with Alzheimer's dementia. MRI unremarkable Will need outpatient follow-up with neurology for further workup of possible underlying dementia Continue Alprazolam , Desvenlafaxine, Seroquel    AKI (acute kidney injury), resolved In the setting of poor oral intake and dehydration Resolved with IVF   Nocturnal hypoxia Continue home supplemental oxygen at that time   Paroxysmal atrial fibrillation  Rate controlled with propranolol  Continue Eliquis    Essential hypertension Recurrent issues with orthostatic hypotension Continue Imdur    Acquired hypothyroidism TSH normal Continue home Synthroid    Disposition Family is uncertain about SNF - son says absolutely not but her daughter may feel differently Her daughter is willing to agree to SNF rehab, ok with [facility] With continued orthostatic hypotension, she needs ongoing monitoring (here vs. SNF) If this improves, she may be able to go home with supervision Family will need to be very involved        Consultants: PT OT   Procedures: None   Antibiotics: None   30 Day Unplanned Readmission Risk Score    Flowsheet Row ED to Hosp-Admission (Current) from 04/18/2024 in [facility] (1A)  30 Day Unplanned Readmission Risk Score (%) 17.12 Filed at 04/21/2024 0801       This score is the patient's risk of an unplanned readmission within 30 days of being discharged (0 -100%). The score is based on dignosis, age, lab data, medications, orders, and past utilization.   Low:  0-14.9   Medium: 15-21.9   High: 22-29.9   Extreme: 30 and above  Subjective: Continues to feel some better but still dizzy with standing and still orthostatic.   Objective: Vitals:   04/21/24 0757 04/21/24 1558  BP: (!)  169/106 (!) 163/108  Pulse: 87 72  Resp: 16 16  Temp: 98.6 F (37 C) 98 F (36.7 C)  SpO2: 97% 98%    Intake/Output Summary (Last 24 hours) at 04/21/2024 1612 Last data filed at 04/21/2024 1101 Gross per 24 hour  Intake 1240 ml  Output --  Net 1240 ml   Filed Weights   04/18/24 0853  Weight: 74.8 kg    Exam:  General:  Appears calm and comfortable and is in NAD Eyes:  EOMI, normal lids, iris ENT:  grossly normal hearing, lips & tongue, mmm Cardiovascular:  Irregularly irregular, 3/6 systolic murmur. No LE edema.  Respiratory:   CTA bilaterally with no wheezes/rales/rhonchi.  Normal respiratory effort. Abdomen:  soft, NT, ND Skin:  no rash or induration seen on limited exam Musculoskeletal:  grossly normal tone BUE/BLE, good ROM, no bony abnormality Psychiatric:  grossly normal mood and affect, speech fluent and appropriate, AOx3 Neurologic:  CN 2-12 grossly intact, moves all extremities in coordinated fashion  Data Reviewed: I have reviewed the patient's lab results since admission.  Pertinent labs for today include:   Glucose 215     Family Communication: None present; I spoke with her daughter by telephone  Disposition: Status is: Inpatient Remains inpatient appropriate because: ongoing orthostatic hypotension   Time spent: 50 minutes  Unresulted Labs (From admission, onward)    None        Author: Lorita Rosa, MD 04/21/2024 4:12 PM  For on call review [email or web address].

## 2024-04-21 NOTE — Inpatient Diabetes Management (Signed)
 Inpatient Diabetes Program Recommendations  AACE/ADA: New Consensus Statement on Inpatient Glycemic Control (2015)  Target Ranges:  Prepandial:   less than 140 mg/dL      [facility] postprandial:   less than 180 mg/dL (1-2 hours)      Critically ill patients:  140 - 180 mg/dL   Lab Results  Component Value Date   GLUCAP 228 (H) 04/21/2024   HGBA1C 7.4 (H) 04/04/2024    Review of Glycemic Control Diabetes history: DM2 Outpatient Diabetes medications: Lantus  20 units at bedtime, Jardiance  25 mg daily, Metformin 1000 mg BID Current orders for Inpatient glycemic control: Semglee  20 units at bedtime, Novolog  0-15 units TID with meals, Novolog  4 units tid with meals   Inpatient Diabetes Program Recommendations:    Consider further increase of Semglee  to 25 units q HS and increase meal coverage to 6 units tid with meals (hold if patient eats less than 50% or NPO).   Thanks,  Josefa Ni, RN, BC-ADM Inpatient Diabetes Coordinator Pager [phone]  (8a-5p)

## 2024-04-21 NOTE — Plan of Care (Signed)
  Problem: Coping: Goal: Ability to adjust to condition or change in health will improve Outcome: Progressing   Problem: Clinical Measurements: Goal: Diagnostic test results will improve Outcome: Progressing   Problem: Activity: Goal: Risk for activity intolerance will decrease Outcome: Progressing   Problem: Coping: Goal: Level of anxiety will decrease Outcome: Progressing   Problem: Pain Managment: Goal: General experience of comfort will improve and/or be controlled Outcome: Progressing

## 2024-04-21 NOTE — TOC Progression Note (Signed)
 Transition of Care [facility]) - Progression Note    Patient Details  Name: Hailey Hawkins MRN: [id number] Date of Birth: 30-Oct-1946  Transition of Care [facility]) CM/SW Contact  Hailey Ice, RN Phone Number: 04/21/2024, 4:15 PM  Clinical Narrative:    Hailey Hawkins, Hailey Hawkins, returning her call. She had concerns regarding mother going to [facility].TOC CM explained process of finding SNF placement and the choices her mother had. She stated she will call her mother and she if she will change her mind.   Met patient and spouse at bedside discussed her bed offers. Spouse stated he would prefer her to anywhere but [facility]. She stated she will go to [facility]. Will initiate auth for [facility]. Notified Dr. Murrel Arnt.         Expected Discharge Plan and Services                                               Social Determinants of Health (SDOH) Interventions SDOH Screenings   Food Insecurity: No Food Insecurity (04/18/2024)  Housing: Low Risk  (04/18/2024)  Transportation Needs: No Transportation Needs (04/18/2024)  Utilities: Not At Risk (04/18/2024)  Financial Resource Strain: Low Risk  (08/28/2023)   Received from [facility]  Social Connections: Moderately Integrated (04/18/2024)  Tobacco Use: Low Risk  (04/18/2024)    Readmission Risk Interventions     No data to display

## 2024-04-22 DIAGNOSIS — R55 Syncope and collapse: Secondary | ICD-10-CM | POA: Diagnosis not present

## 2024-04-22 LAB — CBC WITH DIFFERENTIAL/PLATELET
Abs Immature Granulocytes: 0.04 10*3/uL (ref 0.00–0.07)
Basophils Absolute: 0.1 10*3/uL (ref 0.0–0.1)
Basophils Relative: 1 %
Eosinophils Absolute: 0.2 10*3/uL (ref 0.0–0.5)
Eosinophils Relative: 2 %
HCT: 40.7 % (ref 36.0–46.0)
Hemoglobin: 13.7 g/dL (ref 12.0–15.0)
Immature Granulocytes: 1 %
Lymphocytes Relative: 29 %
Lymphs Abs: 2.5 10*3/uL (ref 0.7–4.0)
MCH: 29.1 pg (ref 26.0–34.0)
MCHC: 33.7 g/dL (ref 30.0–36.0)
MCV: 86.6 fL (ref 80.0–100.0)
Monocytes Absolute: 0.5 10*3/uL (ref 0.1–1.0)
Monocytes Relative: 6 %
Neutro Abs: 5.3 10*3/uL (ref 1.7–7.7)
Neutrophils Relative %: 61 %
Platelets: 250 10*3/uL (ref 150–400)
RBC: 4.7 MIL/uL (ref 3.87–5.11)
RDW: 12.4 % (ref 11.5–15.5)
WBC: 8.5 10*3/uL (ref 4.0–10.5)
nRBC: 0 % (ref 0.0–0.2)

## 2024-04-22 LAB — BASIC METABOLIC PANEL WITH GFR
Anion gap: 8 (ref 5–15)
BUN: 18 mg/dL (ref 8–23)
CO2: 22 mmol/L (ref 22–32)
Calcium: 8.8 mg/dL — ABNORMAL LOW (ref 8.9–10.3)
Chloride: 105 mmol/L (ref 98–111)
Creatinine, Ser: 0.93 mg/dL (ref 0.44–1.00)
GFR, Estimated: >60 mL/min (ref >60)
Glucose, Bld: 170 mg/dL — ABNORMAL HIGH (ref 70–99)
Potassium: 3.6 mmol/L (ref 3.5–5.1)
Sodium: 135 mmol/L (ref 135–145)

## 2024-04-22 LAB — GLUCOSE, CAPILLARY
Glucose-Capillary: 177 mg/dL — ABNORMAL HIGH (ref 70–99)
Glucose-Capillary: 187 mg/dL — ABNORMAL HIGH (ref 70–99)
Glucose-Capillary: 244 mg/dL — ABNORMAL HIGH (ref 70–99)
Glucose-Capillary: 194 mg/dL — ABNORMAL HIGH (ref 70–99)

## 2024-04-22 NOTE — Plan of Care (Signed)
  Problem: Fluid Volume: Goal: Ability to maintain a balanced intake and output will improve Outcome: Progressing   Problem: Health Behavior/Discharge Planning: Goal: Ability to manage health-related needs will improve Outcome: Progressing   Problem: Metabolic: Goal: Ability to maintain appropriate glucose levels will improve Outcome: Progressing   Problem: Nutritional: Goal: Maintenance of adequate nutrition will improve Outcome: Progressing   Problem: Education: Goal: Knowledge of General Education information will improve Description: Including pain rating scale, medication(s)/side effects and non-pharmacologic comfort measures Outcome: Progressing

## 2024-04-22 NOTE — TOC Progression Note (Addendum)
 Transition of Care [facility]) - Progression Note    Patient Details  Name: Sesilia Poucher MRN: [id number] Date of Birth: 1946-03-02  Transition of Care [facility]) CM/SW Contact  Alexandra Ice, RN Phone Number: 04/22/2024, 2:34 PM  Clinical Narrative:     [employer] denied SNF, [facility] was offer.[facility] requested before 4:30pm today 4.25.2025. Please call [phone] opt 5. Provie pts name, DOB and member ID: [id number]. Dr. Murrel Arnt was notified. Also waiting on PT updates. Provided update to patient and daughter.        Expected Discharge Plan and Services                                               Social Determinants of Health (SDOH) Interventions SDOH Screenings   Food Insecurity: No Food Insecurity (04/18/2024)  Housing: Low Risk  (04/18/2024)  Transportation Needs: No Transportation Needs (04/18/2024)  Utilities: Not At Risk (04/18/2024)  Financial Resource Strain: Low Risk  (08/28/2023)   Received from [facility]  Social Connections: Moderately Integrated (04/18/2024)  Tobacco Use: Low Risk  (04/18/2024)    Readmission Risk Interventions     No data to display

## 2024-04-22 NOTE — Plan of Care (Signed)
  Problem: Education: Goal: Ability to describe self-care measures that may prevent or decrease complications (Diabetes Survival Skills Education) will improve Outcome: Progressing Goal: Individualized Educational Video(s) Outcome: Progressing   Problem: Coping: Goal: Ability to adjust to condition or change in health will improve Outcome: Progressing   Problem: Health Behavior/Discharge Planning: Goal: Ability to identify and utilize available resources and services will improve Outcome: Progressing Goal: Ability to manage health-related needs will improve Outcome: Progressing   Problem: Fluid Volume: Goal: Ability to maintain a balanced intake and output will improve Outcome: Progressing   Problem: Tissue Perfusion: Goal: Adequacy of tissue perfusion will improve Outcome: Progressing   Problem: Skin Integrity: Goal: Risk for impaired skin integrity will decrease Outcome: Progressing   Problem: Education: Goal: Knowledge of General Education information will improve Description: Including pain rating scale, medication(s)/side effects and non-pharmacologic comfort measures Outcome: Progressing   Problem: Health Behavior/Discharge Planning: Goal: Ability to manage health-related needs will improve Outcome: Progressing

## 2024-04-22 NOTE — Progress Notes (Signed)
 Occupational Therapy Treatment Patient Details Name: Hailey Hawkins Below MRN: [id number] DOB: 24-Oct-1946 Today's Date: 04/22/2024   History of present illness Pt is a 78 y.o. female presented with syncope. PMH of PAF on Eliquis , refractory HTN, mild aortic stenosis, breast cancer stage Ia s/p chemo/immunotherapy/lumpectomy, IDDM, HLD, hypothyroidism, asthma/COPD, B TKR. Recent admission ~2wks ago for similar. Pt is a 79 y.o. female presented with syncope. PMH of PAF on Eliquis , refractory HTN, mild aortic stenosis, breast cancer stage Ia s/p chemo/immunotherapy/lumpectomy, IDDM, HLD, hypothyroidism, asthma/COPD, B TKR. Recent admission ~2wks ago for similar.   OT comments  Pt seen for OT tx. Daughter present and supportive. Pt agreeable to session. Pt completed bed mobility with modified independence and once in sitting endorsed weakness, wooziness as she has been experiencing with positional changes. TED hose and abdominal binder donned and pt/family education provided in mgt/positioning. Pt was only able to tolerate standing briefly before symptoms became too severe and she was forced to return to sitting. Pt continues to be limited by BP and symptoms with positional changes. Continues to benefit from skilled OT serviecs.       If plan is discharge home, recommend the following:  A little help with walking and/or transfers;A lot of help with bathing/dressing/bathroom;A little help with bathing/dressing/bathroom;Assistance with cooking/housework;Assist for transportation;Help with stairs or ramp for entrance   Equipment Recommendations  BSC/3in1;Other (comment) (RW)    Recommendations for Other Services      Precautions / Restrictions Precautions Precautions: None Recall of Precautions/Restrictions: Intact Precaution/Restrictions Comments: Monitor BP Restrictions Weight Bearing Restrictions Per Provider Order: No       Mobility Bed Mobility Overal bed mobility: Modified Independent Bed  Mobility: Supine to Sit, Sit to Supine     Supine to sit: Modified independent (Device/Increase time) Sit to supine: Modified independent (Device/Increase time)        Transfers Overall transfer level: Needs assistance Equipment used: Rolling walker (2 wheels) Transfers: Sit to/from Stand Sit to Stand: Supervision           General transfer comment: supv with RW, required to promptly return to sitting 2/2 dizziness/wooziness/weakness     Balance Overall balance assessment: Needs assistance Sitting-balance support: No upper extremity supported, Feet supported, Single extremity supported Sitting balance-Leahy Scale: Fair     Standing balance support: Single extremity supported, Bilateral upper extremity supported Standing balance-Leahy Scale: Fair                             ADL either performed or assessed with clinical judgement   ADL Overall ADL's : Needs assistance/impaired     Grooming: Sitting;Set up;Wash/dry face;Wash/dry hands;Brushing hair;Supervision/safety                                      Extremity/Trunk Assessment              Diplomatic Services operational officer     Cognition Arousal: Alert Behavior During Therapy: WFL for tasks assessed/performed Cognition: No apparent impairments                               Following commands: Intact Following commands impaired: Follows one step commands with increased time      Cueing   Cueing  Techniques: Verbal cues  Exercises Other Exercises Other Exercises: education on TED hose and abd binder mgt including donning/doffing Other Exercises: Educated in strategies to promote safety and prepare for positional changes    Shoulder Instructions       General Comments      Pertinent Vitals/ Pain       Pain Assessment Pain Assessment: No/denies pain  Home Living                                          Prior  Functioning/Environment              Frequency  Min 2X/week        Progress Toward Goals  OT Goals(current goals can now be found in the care plan section)  Progress towards OT goals: OT to reassess next treatment;Not progressing toward goals - comment (continues to be limited by symptoms with positional changes and BP)  Acute Rehab OT Goals Patient Stated Goal: have less dizziness OT Goal Formulation: With patient Time For Goal Achievement: 05/03/24 Potential to Achieve Goals: Good  Plan      Co-evaluation                 AM-PAC OT "6 Clicks" Daily Activity     Outcome Measure   Help from another person eating meals?: None Help from another person taking care of personal grooming?: A Little Help from another person toileting, which includes using toliet, bedpan, or urinal?: A Little Help from another person bathing (including washing, rinsing, drying)?: A Lot Help from another person to put on and taking off regular upper body clothing?: A Little Help from another person to put on and taking off regular lower body clothing?: A Little 6 Click Score: 18    End of Session Equipment Utilized During Treatment: Rolling walker (2 wheels)  OT Visit Diagnosis: Other abnormalities of gait and mobility (R26.89);Muscle weakness (generalized) (M62.81);Dizziness and giddiness (R42)   Activity Tolerance Treatment limited secondary to medical complications (Comment) (symptoms with positional changes)   Patient Left in bed;with call bell/phone within reach;with bed alarm set;with family/visitor present   Nurse Communication          Time: 1610-9604 OT Time Calculation (min): 29 min  Charges: OT General Charges $OT Visit: 1 Visit OT Treatments $Self Care/Home Management : 8-22 mins $Therapeutic Activity: 8-22 mins  Berenda Breaker., MPH, MS, OTR/L ascom [phone] 04/22/24, 2:09 PM

## 2024-04-22 NOTE — Progress Notes (Signed)
 Physical Therapy Treatment Patient Details Name: Hailey Hawkins MRN: [id number] DOB: April 19, 1946 Today's Date: 04/22/2024   History of Present Illness Pt is a 78 y.o. female presented with syncope. PMH of PAF on Eliquis , refractory HTN, mild aortic stenosis, breast cancer stage Ia s/p chemo/immunotherapy/lumpectomy, IDDM, HLD, hypothyroidism, asthma/COPD, B TKR. Recent admission ~2wks ago for similar. Pt is a 78 y.o. female presented with syncope. PMH of PAF on Eliquis , refractory HTN, mild aortic stenosis, breast cancer stage Ia s/p chemo/immunotherapy/lumpectomy, IDDM, HLD, hypothyroidism, asthma/COPD, B TKR. Recent admission ~2wks ago for similar.    PT Comments  Pt was long sitting in bed with spouse at bedside. Pt is A and O x 4. She is very pleasant and cooperative endorses being up to BR earlier however was very symptomatic (hypotensive/dizzy). Resting BP 178/102. Pt agreeable to ambulation and OOB activity. She does require vcs and CGA for safety throughout session. Pt tolerated ambulation ~ 25 ft prior to onset of symptoms of dizziness and requesting to return to room. BP once back in room 155/107(122) HR 86 sao2 96% on room air. Pt remains far from her baseline abilities and safety with normal ADLs. Pt's response to activity makes her at high risk of falls. Dc recs remain appropriate to maximize pt's safety with all ADLs while assisting her to PLOF. Acute PT will continue to follow and progress as able per current POC.    If plan is discharge home, recommend the following: A little help with walking and/or transfers;A little help with bathing/dressing/bathroom;Assistance with cooking/housework;Assist for transportation;Help with stairs or ramp for entrance     Equipment Recommendations  None recommended by PT       Precautions / Restrictions Precautions Precautions: None (watch BP) Recall of Precautions/Restrictions: Intact Precaution/Restrictions Comments: Monitor  BP Restrictions Weight Bearing Restrictions Per Provider Order: No     Mobility  Bed Mobility Overal bed mobility: Needs Assistance Bed Mobility: Supine to Sit, Sit to Supine  Supine to sit: Supervision Sit to supine: Supervision General bed mobility comments: HOB was elevated. vcs for improved sequencing    Transfers Overall transfer level: Needs assistance Equipment used: Rolling walker (2 wheels) Transfers: Sit to/from Stand Sit to Stand: Supervision, Contact guard assist  General transfer comment: Supervision -CGA for safety.    Ambulation/Gait Ambulation/Gait assistance: Supervision, Contact guard assist Gait Distance (Feet): 50 Feet Assistive device: Rolling walker (2 wheels) Gait Pattern/deviations: Step-through pattern Gait velocity: decreased  General Gait Details: pt ambulated ~ 25 ft prior to c/o of onset of dizziness and requested to return to room. supervision for ambulation until ymptoms arruived and then CGA for safety due to not wanting to have syncopal episode    Balance Overall balance assessment: Needs assistance Sitting-balance support: No upper extremity supported, Feet supported, Single extremity supported Sitting balance-Leahy Scale: Fair     Standing balance support: Bilateral upper extremity supported, During functional activity, Reliant on assistive device for balance Standing balance-Leahy Scale: Fair Standing balance comment: High fall risk due to pre-syncopal/BP concerns      Communication    Cognition Arousal: Alert Behavior During Therapy: WFL for tasks assessed/performed   PT - Cognitive impairments: No apparent impairments    PT - Cognition Comments: Pt is A and O x 4 Following commands: Intact Following commands impaired: Follows multi-step commands with increased time    Cueing Cueing Techniques: Verbal cues         Pertinent Vitals/Pain Pain Assessment Pain Assessment: No/denies pain     PT Goals (  current goals can now  be found in the care plan section) Acute Rehab PT Goals Patient Stated Goal: to improve dizziness Progress towards PT goals: Progressing toward goals    Frequency    Min 3X/week           Co-evaluation     PT goals addressed during session: Mobility/safety with mobility;Balance        AM-PAC PT "6 Clicks" Mobility   Outcome Measure  Help needed turning from your back to your side while in a flat bed without using bedrails?: A Little Help needed moving from lying on your back to sitting on the side of a flat bed without using bedrails?: A Little Help needed moving to and from a bed to a chair (including a wheelchair)?: A Little Help needed standing up from a chair using your arms (e.g., wheelchair or bedside chair)?: A Little Help needed to walk in hospital room?: A Little Help needed climbing 3-5 steps with a railing? : A Little 6 Click Score: 18    End of Session   Activity Tolerance: Patient tolerated treatment well Patient left: in chair;with call bell/phone within reach;with chair alarm set Nurse Communication: Mobility status PT Visit Diagnosis: Unsteadiness on feet (R26.81);Other abnormalities of gait and mobility (R26.89);Muscle weakness (generalized) (M62.81);History of falling (Z91.81);Pain     Time: 3419-3790 PT Time Calculation (min) (ACUTE ONLY): 14 min  Charges:    $Gait Training: 8-22 mins PT General Charges $$ ACUTE PT VISIT: 1 Visit                     Chester Costa PTA 04/22/24, 3:49 PM

## 2024-04-22 NOTE — TOC Progression Note (Signed)
 Transition of Care [facility]) - Progression Note    Patient Details  Name: Hailey Hawkins MRN: [id number] Date of Birth: 04/01/1946  [facility]) CM/SW Contact  Alexandra Ice, RN Phone Number: 04/22/2024, 4:14 PM  Clinical Narrative:      Met with patient and family at bedside provided update on denial and status of P2P, awaiting for updated PT note to submit to [facility], and now waiting for determination.       Expected Discharge Plan and Services                                               Social Determinants of Health (SDOH) Interventions SDOH Screenings   Food Insecurity: No Food Insecurity (04/18/2024)  Housing: Low Risk  (04/18/2024)  Transportation Needs: No Transportation Needs (04/18/2024)  Utilities: Not At Risk (04/18/2024)  Financial Resource Strain: Low Risk  (08/28/2023)   Received from [facility]  Social Connections: Moderately Integrated (04/18/2024)  Tobacco Use: Low Risk  (04/18/2024)    Readmission Risk Interventions     No data to display

## 2024-04-22 NOTE — Progress Notes (Signed)
 Progress Note   Patient: Hailey Hawkins [id number]:[id number] DOB: Jan 25, 1946 DOA: 04/18/2024     3 DOS: the patient was seen and examined on 04/22/2024   Brief hospital course: 77yo with h/o afib on Eliquis , refractory HTN, mild AS, stage 1a breast CA s/p chemo/immunotherapy, IDDM, HLD, hypothyroidism, and asthma/COPD who presented on 4/21 with a ground level fall.  She was hydrated and given insulin  for marked hyperglycemia.  Assessment and Plan:  Pre-syncope/orthostatic hypotension Patient is presenting with recurrent dizziness leading to ground-level falls without syncope She was admitted for the same earlier this month with positive orthostatic vital signs and continues to have orthostasis She was found to have AKI - likely due to uncontrolled hyperglycemia and memory deficits leading to poor oral intake AKI is resolved but she remains frankly orthostatic Check orthostatic vital signs q12h PT/OT consulted Added ted hose and abdominal binder Orthostatics this AM: 161/95, HR 112 -> 160/128, HR 110 -> 115/80, HR 53 If not improving, could add midodrine  - although since he high BPs at baseline, this is suboptimal Could also add fludrocortisone although her fall risk is high   Uncontrolled type 2 diabetes mellitus with hyperglycemia, with long-term current use of insulin   H/o previously relatively well-controlled insulin -dependent type 2 diabetes, for which she follows with endocrinology Over the last 1 month, blood sugars have been uncontrolled, potentially due to developing memory deficits with lack of support at home Will need to discuss with family if they are able to help manage her insulin  regimen S/p one-time dose IV insulin  Start SSI, moderate Semglee  10 units at bedtime -> 15 units -> 20 units -> 25 units Resumed Jardiance     Memory deficits Patient reports she is been having difficulty remembering if she has been taking her medications over the last several days with no focal  neurological deficits on examination.  Patient's daughter, Hailey Hawkins, notes that family has noticed a decline in patient's memory for the last several months Patient's brother was recently diagnosed with Alzheimer's dementia. MRI unremarkable Will need outpatient follow-up with neurology for further workup of possible underlying dementia Continue Alprazolam , Desvenlafaxine, Seroquel    AKI (acute kidney injury), resolved In the setting of poor oral intake and dehydration Resolved with IVF Orthostasis has persisted despite resolution of AKI   Nocturnal hypoxia Continue home supplemental oxygen at that time   Paroxysmal atrial fibrillation  Rate controlled with propranolol , holding Continue Eliquis    Essential hypertension Recurrent issues with orthostatic hypotension Continue Imdur    Acquired hypothyroidism TSH normal Continue home Synthroid    Disposition Family is uncertain about SNF - son says absolutely not but her daughter is willing to agree to SNF rehab With continued orthostatic hypotension, she needs ongoing monitoring (here vs. SNF) If this improves, she may be able to go home with supervision Family will need to be very involved Peer-to-peer completed today for SNF, awaiting evaluation based on updated PT note         Consultants: PT OT   Procedures: None   Antibiotics: None   30 Day Unplanned Readmission Risk Score    Flowsheet Row ED to Hosp-Admission (Current) from 04/18/2024 in [facility] (1A)  30 Day Unplanned Readmission Risk Score (%) 19.78 Filed at 04/22/2024 1600       This score is the patient's risk of an unplanned readmission within 30 days of being discharged (0 -100%). The score is based on dignosis, age, lab data, medications, orders, and past utilization.   Low:  0-14.9  Medium: 15-21.9   High: 22-29.9   Extreme: 30 and above           Subjective: Feeling some better, still dizzy with standing but  BPs are improved.   Objective: Vitals:   04/22/24 0810 04/22/24 1526  BP: (!) 159/99 (!) 163/95  Pulse: 81 66  Resp: 18 17  Temp: 98.2 F (36.8 C) 98.5 F (36.9 C)  SpO2: 96% 95%    Intake/Output Summary (Last 24 hours) at 04/22/2024 1642 Last data filed at 04/22/2024 1300 Gross per 24 hour  Intake 480 ml  Output --  Net 480 ml   Filed Weights   04/18/24 0853  Weight: 74.8 kg    Exam:  General:  Appears calm and comfortable and is in NAD Eyes:  EOMI, normal lids, iris ENT:  grossly normal hearing, lips & tongue, mmm Cardiovascular:  Irregularly irregular, 3/6 systolic murmur. No LE edema.  Respiratory:   CTA bilaterally with no wheezes/rales/rhonchi.  Normal respiratory effort. Abdomen:  soft, NT, ND Skin:  no rash or induration seen on limited exam Musculoskeletal:  grossly normal tone BUE/BLE, good ROM, no bony abnormality Psychiatric:  grossly normal mood and affect, speech fluent and appropriate, AOx3 Neurologic:  CN 2-12 grossly intact, moves all extremities in coordinated fashion  Data Reviewed: I have reviewed the patient's lab results since admission.  Pertinent labs for today include:   Glucose 170 Normal CBC     Family Communication: Daughter was present throughout evaluation  Disposition: Status is: Inpatient Remains inpatient appropriate because: ongoing management     Time spent: 50 minutes  Unresulted Labs (From admission, onward)    None        Author: Lorita Rosa, MD 04/22/2024 4:42 PM  For on call review [email or web address].

## 2024-04-22 NOTE — Care Management Important Message (Signed)
 Important Message  Patient Details  Name: Hailey Hawkins MRN: [id number] Date of Birth: 09-27-1946   Important Message Given:  Yes - Medicare IM     Daun Rens W, CMA 04/22/2024, 10:03 AM

## 2024-04-23 ENCOUNTER — Other Ambulatory Visit: Payer: Self-pay

## 2024-04-23 DIAGNOSIS — R55 Syncope and collapse: Secondary | ICD-10-CM | POA: Diagnosis not present

## 2024-04-23 LAB — GLUCOSE, CAPILLARY
Glucose-Capillary: 162 mg/dL — ABNORMAL HIGH (ref 70–99)
Glucose-Capillary: 193 mg/dL — ABNORMAL HIGH (ref 70–99)

## 2024-04-23 MED ORDER — CLONIDINE HCL 0.2 MG PO TABS
0.1000 mg | ORAL_TABLET | Freq: Two times a day (BID) | ORAL | 0 refills | Status: AC | PRN
  Filled 2024-04-23: qty 30, 30d supply, fill #0

## 2024-04-23 MED ORDER — PSYLLIUM 95 % PO PACK
1.0000 | PACK | Freq: Every day | ORAL | 0 refills | Status: AC
Start: 2024-04-23 — End: ?
  Filled 2024-04-23: qty 240, 240d supply, fill #0

## 2024-04-23 MED ORDER — INSULIN GLARGINE 100 UNIT/ML ~~LOC~~ SOLN
25.0000 [IU] | Freq: Every day | SUBCUTANEOUS | 11 refills | Status: AC
  Filled 2024-04-23: qty 10, 40d supply, fill #0

## 2024-04-23 MED ORDER — PSYLLIUM 95 % PO PACK
1.0000 | PACK | Freq: Every day | ORAL | Status: DC
  Filled 2024-04-23: qty 1

## 2024-04-23 NOTE — Progress Notes (Signed)
 Patient is not able to walk the distance required to go the bathroom, or he/she is unable to safely negotiate stairs required to access the bathroom.  A 3in1 BSC will alleviate this problem

## 2024-04-23 NOTE — TOC Progression Note (Addendum)
 Transition of Care [facility]) - Progression Note    Patient Details  Name: Hailey Hawkins MRN: [id number] Date of Birth: 05/06/1946  [facility]) CM/SW Contact  Alexandra Ice, RN Phone Number: 04/23/2024, 12:36 PM  Clinical Narrative:    Patient plan to discharge home today, needs home health services and DME. She has no preference on agencies to use. Sent referral to Ada with [employer] for RW and BSC, and [place]  with [facility] for PT/OT needs.  Family interested in private caregiver services sent referral to [facility] with [employer].   12:28p Received message from Ada with [employer] patient received wheelchair 2 weeks ago and will need to pay out of pocket for RW, $65. Contacted patient's room, discussed DME, she stated she has WC and RW at home, and only need BSC. Sent [facility] with [employer] message stating already has RW and to process the [facility].        Expected Discharge Plan and Services         Expected Discharge Date: 04/23/24                                     Social Determinants of Health (SDOH) Interventions SDOH Screenings   Food Insecurity: No Food Insecurity (04/18/2024)  Housing: Low Risk  (04/18/2024)  Transportation Needs: No Transportation Needs (04/18/2024)  Utilities: Not At Risk (04/18/2024)  Financial Resource Strain: Low Risk  (08/28/2023)   Received from [facility]  Social Connections: Moderately Integrated (04/18/2024)  Tobacco Use: Low Risk  (04/18/2024)    Readmission Risk Interventions     No data to display

## 2024-04-23 NOTE — Discharge Summary (Signed)
 Physician Discharge Summary   Patient: Hailey Hawkins MRN: [id number] DOB: 07-25-1946  Admit date:     04/18/2024  Discharge date: 04/23/24  Discharge Physician: Lorita Rosa   PCP: Yehuda Helms, MD   Recommendations at discharge:   You are being discharged with home physical and occupational therapy Use care with changes in position - use the walker, do not ambulate if dizzy/unsteady Wear TED hose while awake and abdominal binder with ambulation If non-medication strategies are ineffective, consider fludrocortisone > midodrine  Take propranolol  daily but hold for systolic blood pressure <160 Take Clonidine  (Catapres ) only if systolic blood pressure remains >160 after propranolol  Increase glargine to 25 units nightly Follow up with neurology for cognitive evaluation Follow up with Dr. Claudius Cumins next week  Discharge Diagnoses: Principal Problem:   Pre-syncope Active Problems:   Uncontrolled type 2 diabetes mellitus with hyperglycemia, with long-term current use of insulin  (HCC)   Memory deficits   AKI (acute kidney injury) (HCC)   Acquired hypothyroidism   Essential hypertension   Paroxysmal atrial fibrillation (HCC)   Nocturnal hypoxia   Hospital Course: 77yo with h/o afib on Eliquis , refractory HTN, mild AS, stage 1a breast CA s/p chemo/immunotherapy, IDDM, HLD, hypothyroidism, and asthma/COPD who presented on 4/21 with a ground level fall.  She was hydrated and given insulin  for marked hyperglycemia.  Assessment and Plan:  Pre-syncope/orthostatic hypotension Patient is presenting with recurrent dizziness leading to ground-level falls without syncope She was admitted for the same earlier this month with positive orthostatic vital signs and continues to have orthostasis She was found to have AKI - likely due to uncontrolled hyperglycemia and memory deficits leading to poor oral intake AKI is resolved but she remains frankly orthostatic Orthostasis has improved with  conservative measures including ted hose and abdominal binder PT/OT consulted Orthostatics this AM: 152/101, HR 80 -> 147/89, HR 82 -> 140/90, HR 72 If not improving, could add fludrocortisone  or midodrine  - although with HTN at baseline, this is suboptimal   Uncontrolled type 2 diabetes mellitus with hyperglycemia, with long-term current use of insulin   H/o previously relatively well-controlled insulin -dependent type 2 diabetes, for which she follows with endocrinology Over the last 1 month, blood sugars have been uncontrolled, potentially due to developing memory deficits with lack of support at home Will need to discuss with family if they are able to help manage her insulin  regimen S/p one-time dose IV insulin  Semglee  10 units at bedtime -> 15 units -> 20 units -> 25 units Resumed Jardiance     Memory deficits Patient reports she is been having difficulty remembering if she has been taking her medications over the last several days with no focal neurological deficits on examination.  Patient's daughter, [name], notes that family has noticed a decline in patient's memory for the last several months Patient's brother was recently diagnosed with Alzheimer's dementia. MRI unremarkable Will need outpatient follow-up with neurology for further workup of possible underlying dementia Continue Alprazolam , Desvenlafaxine, Seroquel    AKI (acute kidney injury), resolved In the setting of poor oral intake and dehydration Resolved with IVF Orthostasis has persisted despite resolution of AKI   Nocturnal hypoxia Continue home supplemental oxygen as needed   Paroxysmal atrial fibrillation  Rate controlled with propranolol , holding Continue Eliquis    Essential hypertension Recurrent issues with orthostatic hypotension Continue Imdur    Acquired hypothyroidism TSH normal Continue home Synthroid    Disposition Family is uncertain about SNF - son says absolutely not but her daughter is willing  to agree to  SNF rehab With continued orthostatic hypotension, she needs ongoing monitoring (here vs. SNF) If this improves, she may be able to go home with supervision Family will need to be very involved Peer-to-peer completed today for SNF, family is planning to take her home today         Consultants: PT OT TOC team   Procedures: None   Antibiotics: None   Pain control -   Controlled Substance Reporting System database was reviewed. and patient was instructed, not to drive, operate heavy machinery, perform activities at heights, swimming or participation in water activities or provide baby-sitting services while on Pain, Sleep and Anxiety Medications; until their outpatient Physician has advised to do so again. Also recommended to not to take more than prescribed Pain, Sleep and Anxiety Medications.   Disposition: Home Diet recommendation:  Carb modified diet DISCHARGE MEDICATION: Allergies as of 04/23/2024       Reactions   Methamphetamine Shortness Of Breath   Difficulty breathing (INHALER)   Amoxicillin    Other reaction(s): Diarrhea and vomiting (finding)   Antihistamines, Chlorpheniramine-type    Other reaction(s): Other (See Comments) Severe dryness   Ciprofloxacin    Other reaction(s): Distress (finding)   Codeine    Other reaction(s): Diarrhea and vomiting (finding), Weal or any derivitive   Other Other (See Comments)   Birth control pills - blood clots Birth control pills - blood clots   Oxycodone-acetaminophen  Nausea And Vomiting   Penicillin G    Other reaction(s): Weal   Meloxicam Palpitations        Medication List     TAKE these medications    acetaminophen  500 MG tablet Commonly known as: TYLENOL  Take 1,000 mg by mouth every 6 (six) hours as needed for mild pain.   albuterol  108 (90 Base) MCG/ACT inhaler Commonly known as: VENTOLIN  HFA Inhale 2 puffs into the lungs every 4 (four) hours as needed for wheezing or shortness of  breath.   ALPRAZolam  0.5 MG tablet Commonly known as: XANAX  Take 0.5 mg by mouth at bedtime as needed for sleep.   apixaban  5 MG Tabs tablet Commonly known as: ELIQUIS  Take 5 mg by mouth 2 (two) times daily.   atorvastatin  10 MG tablet Commonly known as: LIPITOR Take 10 mg by mouth daily.   cloNIDine  0.2 MG tablet Commonly known as: CATAPRES  Take 0.5 tablets (0.1 mg total) by mouth 2 (two) times daily as needed (SBP >160 despite propranolol ). What changed:  when to take this reasons to take this   desvenlafaxine 25 MG 24 hr tablet Commonly known as: PRISTIQ Take 25 mg by mouth daily.   empagliflozin  25 MG Tabs tablet Commonly known as: JARDIANCE  Take 25 mg by mouth daily.   FREESTYLE LITE test strip Generic drug: glucose blood Use 2 (two) times daily   insulin  glargine 100 UNIT/ML injection Commonly known as: LANTUS  Inject 0.25 mLs (25 Units total) into the skin at bedtime. What changed: how much to take   INSULIN  SYRINGE .5CC/31GX5/16" 31G X 5/16" 0.5 ML Misc Use 1 syringe daily for insulin  injections   isosorbide  mononitrate 30 MG 24 hr tablet Commonly known as: IMDUR  Take 30 mg by mouth 2 (two) times daily.   levothyroxine  75 MCG tablet Commonly known as: SYNTHROID  Take 75 mcg by mouth daily before breakfast.   metFORMIN 1000 MG tablet Commonly known as: GLUCOPHAGE Take 1,000 mg by mouth 2 (two) times daily with a meal.   NovoFine 30G X 8 MM Misc Generic drug: Insulin  Pen  Needle as directed To inject insulin .   omeprazole 20 MG capsule Commonly known as: PRILOSEC Take 20 mg by mouth 2 (two) times daily before a meal.   OXYGEN Inhale 2 L into the lungs at bedtime.   propranolol  20 MG tablet Commonly known as: INDERAL  Take 1 tablet by mouth 2 (two) times daily.   psyllium 95 % Pack Commonly known as: HYDROCIL/METAMUCIL Take 1 packet by mouth daily.   QUEtiapine  50 MG tablet Commonly known as: SEROQUEL  Take 50 mg by mouth at bedtime.                Durable Medical Equipment  (From admission, onward)           Start     Ordered   04/23/24 1147  For home use only DME Bedside commode  Once       Question:  Patient needs a bedside commode to treat with the following condition  Answer:  Postural dizziness with near syncope   04/23/24 1147             Discharge Exam:   Subjective: She is still quite dizzy.  She is delighted to be going home.  Daughter prefers that she go home today so that she can be with her mother for a couple of days of transition.   Objective: Vitals:   04/23/24 0507 04/23/24 0836  BP: (!) 140/90 (!) 168/116  Pulse: 72   Resp:  17  Temp: 97.8 F (36.6 C) 98.2 F (36.8 C)  SpO2: 94% 96%    Intake/Output Summary (Last 24 hours) at 04/23/2024 1231 Last data filed at 04/23/2024 0946 Gross per 24 hour  Intake 300 ml  Output --  Net 300 ml   Filed Weights   04/18/24 0853  Weight: 74.8 kg    Exam:  General:  Appears calm and comfortable and is in NAD Eyes:  EOMI, normal lids, iris ENT:  grossly normal hearing, lips & tongue, mmm Cardiovascular:  Irregularly irregular, 3/6 systolic murmur. No LE edema.  Respiratory:   CTA bilaterally with no wheezes/rales/rhonchi.  Normal respiratory effort. Abdomen:  soft, NT, ND Skin:  no rash or induration seen on limited exam Musculoskeletal:  grossly normal tone BUE/BLE, good ROM, no bony abnormality Psychiatric:  grossly normal mood and affect, speech fluent and appropriate, AOx3 Neurologic:  CN 2-12 grossly intact, moves all extremities in coordinated fashion  Data Reviewed: I have reviewed the patient's lab results since admission.  Pertinent labs for today include:   None    Condition at discharge: fair  The results of significant diagnostics from this hospitalization (including imaging, microbiology, ancillary and laboratory) are listed below for reference.   Imaging Studies: MR BRAIN WO CONTRAST Result Date:  04/18/2024 CLINICAL DATA:  Mental status change, unknown cause.  Hyperglycemia. EXAM: MRI HEAD WITHOUT CONTRAST TECHNIQUE: Multiplanar, multiecho pulse sequences of the brain and surrounding structures were obtained without intravenous contrast. COMPARISON:  Head CT 04/18/2024 and MRI 05/15/2014 FINDINGS: Brain: There is no evidence of an acute infarct, intracranial hemorrhage, mass, midline shift, or extra-axial fluid collection. Small T2 hyperintensities in the cerebral white matter bilaterally have progressed from 2015 and are nonspecific but compatible with mild chronic small vessel ischemic disease. Mild generalized cerebral atrophy is within normal limits for age. Vascular: Major intracranial vascular flow voids are preserved. Skull and upper cervical spine: Unremarkable bone marrow signal. Sinuses/Orbits: Unremarkable orbits. Small volume fluid in the sphenoid sinuses and mastoid air cells bilaterally. Other: None. IMPRESSION:  1. No acute intracranial abnormality. 2. Mild chronic small vessel ischemic disease. Electronically Signed   By: Aundra Lee M.D.   On: 04/18/2024 16:17   DG Pelvis 1-2 Views Result Date: 04/18/2024 CLINICAL DATA:  Pain after fall. EXAM: PELVIS - 1-2 VIEW COMPARISON:  04/04/2024. FINDINGS: There is no evidence of acute fracture or diastasis. Femoral heads are seated within the acetabula. Severe degenerative changes of the right hip resulting in bone-on-bone contact and subchondral sclerosis. Mild-to-moderate degenerative changes of the left hip. Sacroiliac joints and pubic symphysis are anatomically aligned with degenerative changes. Vascular calcifications are present. IMPRESSION: No acute osseous abnormality. Electronically Signed   By: Mannie Seek M.D.   On: 04/18/2024 11:24   CT Head Wo Contrast Result Date: 04/18/2024 CLINICAL DATA:  Found down on floor, insert mechanism of fall. EXAM: CT HEAD WITHOUT CONTRAST CT CERVICAL SPINE WITHOUT CONTRAST TECHNIQUE: Multidetector  CT imaging of the head and cervical spine was performed following the standard protocol without intravenous contrast. Multiplanar CT image reconstructions of the cervical spine were also generated. RADIATION DOSE REDUCTION: This exam was performed according to the departmental dose-optimization program which includes automated exposure control, adjustment of the mA and/or kV according to patient size and/or use of iterative reconstruction technique. COMPARISON:  CT head 04/14/2024. FINDINGS: CT HEAD FINDINGS Brain: No acute intracranial hemorrhage. No CT evidence of acute infarct. Nonspecific hypoattenuation in the periventricular and subcortical white matter favored to reflect chronic microvascular ischemic changes. No edema, mass effect, or midline shift. The basilar cisterns are patent. Ventricles: Prominence of the ventricles suggesting underlying parenchymal volume loss. Vascular: Atherosclerotic calcifications of the carotid siphons and intracranial vertebral arteries. No hyperdense vessel. Skull: No acute or aggressive finding. Orbits: Orbits are symmetric. Sinuses: Layering secretions in the bilateral sphenoid sinuses. Other: Mastoid air cells are clear. CT CERVICAL SPINE FINDINGS Alignment: There is straightening of the cervical lordosis within the lower cervical spine. Trace retrolisthesis of C2 on C3 and trace anterolisthesis of C3 on C4. Trace retrolisthesis of C7 on T1. No facet subluxation or dislocation. Skull base and vertebrae: No compression fracture or displaced fracture in the cervical spine. No suspicious osseous lesion. Soft tissues and spinal canal: No prevertebral fluid or swelling. No visible canal hematoma. Prominence of the left thyroid  lobe with multiple small calcifications. Disc levels: Intervertebral disc space narrowing at multiple levels. Disc osteophyte complex at multiple levels in the cervical spine. No high-grade osseous spinal canal stenosis. Facet arthrosis throughout the  cervical spine. Foraminal narrowing at multiple levels which is most pronounced on the left at C6-7. Upper chest: Negative. Other: None. IMPRESSION: No CT evidence of acute intracranial abnormality. No acute fracture or traumatic malalignment of the cervical spine. Layering secretions in the bilateral sphenoid sinuses. Recommend correlation for acute sinusitis. Mild chronic microvascular ischemic changes and mild parenchymal volume loss. Degenerative changes of the cervical spine as above. Electronically Signed   By: Denny Flack M.D.   On: 04/18/2024 11:08   CT Cervical Spine Wo Contrast Result Date: 04/18/2024 CLINICAL DATA:  Found down on floor, insert mechanism of fall. EXAM: CT HEAD WITHOUT CONTRAST CT CERVICAL SPINE WITHOUT CONTRAST TECHNIQUE: Multidetector CT imaging of the head and cervical spine was performed following the standard protocol without intravenous contrast. Multiplanar CT image reconstructions of the cervical spine were also generated. RADIATION DOSE REDUCTION: This exam was performed according to the departmental dose-optimization program which includes automated exposure control, adjustment of the mA and/or kV according to patient size and/or use of  iterative reconstruction technique. COMPARISON:  CT head 04/14/2024. FINDINGS: CT HEAD FINDINGS Brain: No acute intracranial hemorrhage. No CT evidence of acute infarct. Nonspecific hypoattenuation in the periventricular and subcortical white matter favored to reflect chronic microvascular ischemic changes. No edema, mass effect, or midline shift. The basilar cisterns are patent. Ventricles: Prominence of the ventricles suggesting underlying parenchymal volume loss. Vascular: Atherosclerotic calcifications of the carotid siphons and intracranial vertebral arteries. No hyperdense vessel. Skull: No acute or aggressive finding. Orbits: Orbits are symmetric. Sinuses: Layering secretions in the bilateral sphenoid sinuses. Other: Mastoid air cells  are clear. CT CERVICAL SPINE FINDINGS Alignment: There is straightening of the cervical lordosis within the lower cervical spine. Trace retrolisthesis of C2 on C3 and trace anterolisthesis of C3 on C4. Trace retrolisthesis of C7 on T1. No facet subluxation or dislocation. Skull base and vertebrae: No compression fracture or displaced fracture in the cervical spine. No suspicious osseous lesion. Soft tissues and spinal canal: No prevertebral fluid or swelling. No visible canal hematoma. Prominence of the left thyroid  lobe with multiple small calcifications. Disc levels: Intervertebral disc space narrowing at multiple levels. Disc osteophyte complex at multiple levels in the cervical spine. No high-grade osseous spinal canal stenosis. Facet arthrosis throughout the cervical spine. Foraminal narrowing at multiple levels which is most pronounced on the left at C6-7. Upper chest: Negative. Other: None. IMPRESSION: No CT evidence of acute intracranial abnormality. No acute fracture or traumatic malalignment of the cervical spine. Layering secretions in the bilateral sphenoid sinuses. Recommend correlation for acute sinusitis. Mild chronic microvascular ischemic changes and mild parenchymal volume loss. Degenerative changes of the cervical spine as above. Electronically Signed   By: Denny Flack M.D.   On: 04/18/2024 11:08   CT HEAD WO CONTRAST ( ) Result Date: 04/14/2024 CLINICAL DATA:  Head trauma, minor (Age >= 65y) Pt arrives via ems from home with c/o hyperglycemia EXAM: CT HEAD WITHOUT CONTRAST TECHNIQUE: Contiguous axial images were obtained from the base of the skull through the vertex without intravenous contrast. RADIATION DOSE REDUCTION: This exam was performed according to the departmental dose-optimization program which includes automated exposure control, adjustment of the mA and/or kV according to patient size and/or use of iterative reconstruction technique. COMPARISON:  CT head 04/04/2024 FINDINGS:  Brain: Patchy and confluent areas of decreased attenuation are noted throughout the deep and periventricular white matter of the cerebral hemispheres bilaterally, compatible with chronic microvascular ischemic disease. No evidence of large-territorial acute infarction. No parenchymal hemorrhage. No mass lesion. No extra-axial collection. No mass effect or midline shift. No hydrocephalus. Basilar cisterns are patent. Vascular: No hyperdense vessel. Atherosclerotic calcifications are present within the cavernous internal carotid and vertebral arteries. Skull: No acute fracture or focal lesion. Sinuses/Orbits: Left sphenoid sinus mucosal thickening. Otherwise paranasal sinuses and mastoid air cells are clear. The orbits are unremarkable. Other: None. IMPRESSION: No acute intracranial abnormality. Electronically Signed   By: Morgane  Naveau M.D.   On: 04/14/2024 19:38   ECHOCARDIOGRAM COMPLETE Result Date: 04/05/2024    ECHOCARDIOGRAM REPORT   Patient Name:   SOPHYA MORAL [facility] Date of Exam: 04/04/2024 Medical Rec #:  [id number]        Height:       65.0 in Accession #:    [id number]       Weight:       185.0 lb Date of Birth:  [birth year]       BSA:          1.914 m Patient Age:  77 years         BP:           116/77 mmHg Patient Gender: F                HR:           72 bpm. Exam Location:  [facility] Procedure: 2D Echo, Cardiac Doppler and Color Doppler (Both Spectral and Color            Flow Doppler were utilized during procedure). Indications:     Syncope R55  History:         Patient has no prior history of Echocardiogram examinations.                  Signs/Symptoms:Shortness of Breath; Risk Factors:Hypertension.                  Tachycardia.  Sonographer:     Broadus Canes Referring Phys:  [id number] [place] Diagnosing Phys: Isabell Manzanilla  Sonographer Comments: Technically challenging study due to limited acoustic windows and no apical window. IMPRESSIONS  1. Left ventricular ejection fraction, by estimation, is 60  to 65%. The left ventricle has normal function. The left ventricle has no regional wall motion abnormalities. Left ventricular diastolic parameters were normal.  2. Right ventricular systolic function is normal. The right ventricular size is normal.  3. The mitral valve is normal in structure. Trivial mitral valve regurgitation. No evidence of mitral stenosis.  4. The aortic valve is normal in structure. Aortic valve regurgitation is mild. Aortic valve sclerosis/calcification is present, without any evidence of aortic stenosis.  5. The inferior vena cava is normal in size with greater than 50% respiratory variability, suggesting right atrial pressure of 3 mmHg. FINDINGS  Left Ventricle: Left ventricular ejection fraction, by estimation, is 60 to 65%. The left ventricle has normal function. The left ventricle has no regional wall motion abnormalities. The left ventricular internal cavity size was normal in size. There is  no left ventricular hypertrophy. Left ventricular diastolic parameters were normal. Right Ventricle: The right ventricular size is normal. No increase in right ventricular wall thickness. Right ventricular systolic function is normal. Left Atrium: Left atrial size was normal in size. Right Atrium: Right atrial size was normal in size. Pericardium: There is no evidence of pericardial effusion. Mitral Valve: The mitral valve is normal in structure. Trivial mitral valve regurgitation. No evidence of mitral valve stenosis. Tricuspid Valve: The tricuspid valve is normal in structure. Tricuspid valve regurgitation is trivial. Aortic Valve: The aortic valve is normal in structure. Aortic valve regurgitation is mild. Aortic valve sclerosis/calcification is present, without any evidence of aortic stenosis. Pulmonic Valve: The pulmonic valve was normal in structure. Pulmonic valve regurgitation is not visualized. Aorta: The aortic root is normal in size and structure. Venous: The inferior vena cava is normal  in size with greater than 50% respiratory variability, suggesting right atrial pressure of 3 mmHg. IAS/Shunts: No atrial level shunt detected by color flow Doppler.  LEFT VENTRICLE PLAX 2D LVIDd:         3.90 cm LVIDs:         2.70 cm LV PW:         1.50 cm LV IVS:        1.40 cm LVOT diam:     2.85 cm LVOT Area:     6.38 cm  LEFT ATRIUM         Index LA diam:    4.50  cm 2.35 cm/m   AORTA Ao Root diam: 3.50 cm TRICUSPID VALVE TR Peak grad:   12.7 mmHg TR Vmax:        178.00 cm/s  SHUNTS Systemic Diam: 2.85 cm Isabell Manzanilla Electronically signed by Isabell Manzanilla Signature Date/Time: 04/05/2024/1:47:18 PM    Final    CT Lumbar Spine Wo Contrast Result Date: 04/04/2024 CLINICAL DATA:  78 year old female status post fall.  Pain. EXAM: CT LUMBAR SPINE WITHOUT CONTRAST TECHNIQUE: Multidetector CT imaging of the lumbar spine was performed without intravenous contrast administration. Multiplanar CT image reconstructions were also generated. RADIATION DOSE REDUCTION: This exam was performed according to the departmental dose-optimization program which includes automated exposure control, adjustment of the mA and/or kV according to patient size and/or use of iterative reconstruction technique. COMPARISON:  Thoracic CT today reported separately. Chest CT 05/04/2020. FINDINGS: Segmentation: Normal, concordant with the thoracic numbering today. Alignment: Mild levoconvex lumbar scoliosis. Maintained lumbar lordosis. Mild degenerative retrolisthesis L1-L2 and L2-L3. Vertebrae: Osteopenia. L1 vertebral height stable since 2021. Maintained other lumbar vertebral body height. Benign hemangioma of the L3 vertebral body (no follow-up imaging recommended). Lumbar vertebrae appear intact. Visible sacrum and SI joints appear intact. Paraspinal and other soft tissues: Negative costophrenic angles. Aortoiliac calcified atherosclerosis. Normal caliber abdominal aorta. Large bowel retained stool. Lumbar paraspinal soft tissues are  within normal limits. Disc levels: Chronic vacuum disc at L1-L2. Mild degenerative lumbar spinal stenosis suspected there. And mild to moderate at L2-L3 related to combined disc and posterior element degeneration (sagittal image 32). Mild L3-L4 degeneration also suspected with bulky calcified ligament flavum hypertrophy contributing there. Otherwise L4-L5 and L5-S1 facet arthropathy. IMPRESSION: 1. Osteopenia. No acute traumatic injury identified in the Lumbar Spine. 2. 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 Electronically Signed   By: Marlise Simpers M.D.   On: 04/04/2024 08:40   CT Thoracic Spine Wo Contrast Result Date: 04/04/2024 CLINICAL DATA:  78 year old female status post fall.  Pain. EXAM: CT THORACIC SPINE WITHOUT CONTRAST TECHNIQUE: Multidetector CT images of the thoracic were obtained using the standard protocol without intravenous contrast. RADIATION DOSE REDUCTION: This exam was performed according to the departmental dose-optimization program which includes automated exposure control, adjustment of the mA and/or kV according to patient size and/or use of iterative reconstruction technique. COMPARISON:  Chest CT 05/04/2020. FINDINGS: Limited cervical spine imaging: Cervicothoracic junction alignment is within normal limits. Chronic disc and endplate degeneration at that level. Thoracic spine segmentation:  Normal. Alignment: Stable thoracic kyphosis since 2021. Mild thoracic scoliosis. No spondylolisthesis. Vertebrae: Chronic osteopenia. Maintained thoracic vertebral height. Chronic interbody ankylosis appears degenerative at T6-T7. No acute thoracic vertebral fracture identified. Visible posterior ribs appear intact. Paraspinal and other soft tissues: Visible central airways, lungs demonstrate stable patency and ventilation. Calcified coronary artery atherosclerosis or stent. Calcified aortic atherosclerosis. No pericardial or pleural effusion is evident.  Negative visible noncontrast upper abdominal viscera. Thoracic paraspinal soft tissues are within normal limits. Disc levels: Age-appropriate thoracic spine degeneration does not appear significantly changed from the 2021 CT. Lumbar spine reported separately. IMPRESSION: 1. No acute traumatic injury identified in the Thoracic Spine. 2. Stable CT appearance of the thoracic spine since 2021. 3.  Aortic Atherosclerosis (ICD10-I70.0). Electronically Signed   By: Marlise Simpers M.D.   On: 04/04/2024 08:28   DG Chest Portable 1 View Result Date: 04/04/2024 CLINICAL DATA:  Mechanical fall. EXAM: PORTABLE CHEST 1 VIEW COMPARISON:  04/01/2023. FINDINGS: Bilateral lung fields are clear. Bilateral costophrenic angles are clear. Note is  made of elevated right hemidiaphragm. Normal cardio-mediastinal silhouette. No acute osseous abnormalities. The soft tissues are within normal limits. IMPRESSION: No active disease. Electronically Signed   By: Beula Brunswick M.D.   On: 04/04/2024 08:23   DG Knee 1-2 Views Right Result Date: 04/04/2024 CLINICAL DATA:  Mechanical fall.  Bilateral knee pain. EXAM: RIGHT KNEE - 1-2 VIEW; LEFT KNEE - 1-2 VIEW COMPARISON:  09/20/2012 and 04/21/2011. FINDINGS: No acute fracture or dislocation. No aggressive osseous lesion. Redemonstration of bilateral total knee arthroplasty with patellar resurfacing. The hardware is intact. No periprosthetic fracture or lucency. No interval change in alignment, when compared to the available prior examination. No knee effusion or focal soft tissue swelling. No radiopaque foreign bodies. IMPRESSION: *No acute osseous abnormality of the bilateral knees. Bilateral total knee arthroplasty without evidence of hardware failure. Electronically Signed   By: Beula Brunswick M.D.   On: 04/04/2024 08:22   DG Knee 1-2 Views Left Result Date: 04/04/2024 CLINICAL DATA:  Mechanical fall.  Bilateral knee pain. EXAM: RIGHT KNEE - 1-2 VIEW; LEFT KNEE - 1-2 VIEW COMPARISON:  09/20/2012  and 04/21/2011. FINDINGS: No acute fracture or dislocation. No aggressive osseous lesion. Redemonstration of bilateral total knee arthroplasty with patellar resurfacing. The hardware is intact. No periprosthetic fracture or lucency. No interval change in alignment, when compared to the available prior examination. No knee effusion or focal soft tissue swelling. No radiopaque foreign bodies. IMPRESSION: *No acute osseous abnormality of the bilateral knees. Bilateral total knee arthroplasty without evidence of hardware failure. Electronically Signed   By: Beula Brunswick M.D.   On: 04/04/2024 08:22   DG Pelvis 1-2 Views Result Date: 04/04/2024 CLINICAL DATA:  Mechanical fall. EXAM: PELVIS - 1-2 VIEW COMPARISON:  None Available. FINDINGS: Pelvis is intact with normal and symmetric sacroiliac joints. No acute fracture or dislocation. No aggressive osseous lesion. Visualized sacral arcuate lines are unremarkable. There are changes of chronic pubic symphisitis. There are moderate to severe degenerative changes of bilateral hip joints (right > left), in the form of reduced joint space, subchondral sclerosis / cystic changes and osteophytosis. No radiopaque foreign bodies. IMPRESSION: *No acute osseous abnormality of the pelvis. Electronically Signed   By: Beula Brunswick M.D.   On: 04/04/2024 08:19   CT Head Wo Contrast Result Date: 04/04/2024 CLINICAL DATA:  Mental status change.  Syncope.  No trauma. EXAM: CT HEAD WITHOUT CONTRAST TECHNIQUE: Contiguous axial images were obtained from the base of the skull through the vertex without intravenous contrast. RADIATION DOSE REDUCTION: This exam was performed according to the departmental dose-optimization program which includes automated exposure control, adjustment of the mA and/or kV according to patient size and/or use of iterative reconstruction technique. COMPARISON:  Brain MRI from 05/15/2014 FINDINGS: Brain: No evidence of acute infarction, hemorrhage, hydrocephalus,  extra-axial collection or mass lesion/mass effect. There is mild diffuse low-attenuation within the subcortical and periventricular white matter compatible with chronic microvascular disease. Vascular: No hyperdense vessel or unexpected calcification. Skull: Normal. Negative for fracture or focal lesion. Sinuses/Orbits: There is mild mucosal thickening involving the left maxillary sinus and sphenoid sinus. Other: None. IMPRESSION: 1. No acute intracranial abnormalities. 2. Chronic microvascular disease. 3. Mild paranasal sinus disease. Electronically Signed   By: Kimberley Penman M.D.   On: 04/04/2024 07:30    Microbiology: Results for orders placed or performed during the hospital encounter of 04/14/24  Urine Culture     Status: Abnormal   Collection Time: 04/14/24  4:10 PM   Specimen: Urine, Clean Catch  Result Value Ref Range Status   Specimen Description   Final    URINE, CLEAN CATCH Performed at [facility], [address] [place], [place] [address]    Special Requests   Final    NONE Performed at [facility], [address] [place], [place] [address]    Culture 10,000 COLONIES/mL ENTEROCOCCUS FAECALIS (A)  Final   Report Status 04/16/2024 FINAL  Final   Organism ID, Bacteria ENTEROCOCCUS FAECALIS (A)  Final      Susceptibility   Enterococcus faecalis - MIC*    AMPICILLIN <=2 SENSITIVE Sensitive     NITROFURANTOIN <=16 SENSITIVE Sensitive     VANCOMYCIN  1 SENSITIVE Sensitive     * 10,000 COLONIES/mL ENTEROCOCCUS FAECALIS    Labs: CBC: Recent Labs  Lab 04/18/24 0854 04/19/24 0431 04/20/24 0415 04/22/24 0840  WBC 5.2 5.8 6.7 8.5  NEUTROABS 3.5  --  3.8 5.3  HGB 10.6* 11.6* 12.8 13.7  HCT 30.9* 33.7* 36.5 40.7  MCV 85.8 85.8 82.8 86.6  PLT 172 161 196 250   Basic Metabolic Panel: Recent Labs  Lab 04/18/24 0854 04/19/24 0431 04/20/24 0415 04/21/24 0402 04/22/24 0840  NA 130* 135 135 139 135  K 3.9 3.1* 3.1* 3.5 3.6  CL 97* 104 102 102 105   CO2 26 23 23 23 22   GLUCOSE 582* 243* 332* 215* 170*  BUN 28* 20 15 20 18   CREATININE 1.16* 0.88 0.75 0.75 0.93  CALCIUM  8.7* 8.7* 8.6* 9.1 8.8*   Liver Function Tests: Recent Labs  Lab 04/18/24 0854  AST 12*  ALT 9  ALKPHOS 60  BILITOT 1.0  PROT 6.5  ALBUMIN 3.1*   CBG: Recent Labs  Lab 04/22/24 1130 04/22/24 1618 04/22/24 2133 04/23/24 0828 04/23/24 1126  GLUCAP 187* 244* 194* 162* 193*    Discharge time spent: greater than 30 minutes.  Signed: Lorita Rosa, MD [facility] 04/23/2024

## 2024-04-23 NOTE — Plan of Care (Signed)
  Problem: Education: Goal: Ability to describe self-care measures that may prevent or decrease complications (Diabetes Survival Skills Education) will improve Outcome: Progressing Goal: Individualized Educational Video(s) Outcome: Progressing   Problem: Coping: Goal: Ability to adjust to condition or change in health will improve Outcome: Progressing   Problem: Fluid Volume: Goal: Ability to maintain a balanced intake and output will improve Outcome: Progressing   Problem: Health Behavior/Discharge Planning: Goal: Ability to identify and utilize available resources and services will improve Outcome: Progressing Goal: Ability to manage health-related needs will improve Outcome: Progressing   Problem: Metabolic: Goal: Ability to maintain appropriate glucose levels will improve Outcome: Progressing   Problem: Nutritional: Goal: Maintenance of adequate nutrition will improve Outcome: Progressing Goal: Progress toward achieving an optimal weight will improve Outcome: Progressing   Problem: Tissue Perfusion: Goal: Adequacy of tissue perfusion will improve Outcome: Progressing   Problem: Education: Goal: Knowledge of General Education information will improve Description: Including pain rating scale, medication(s)/side effects and non-pharmacologic comfort measures Outcome: Progressing   Problem: Health Behavior/Discharge Planning: Goal: Ability to manage health-related needs will improve Outcome: Progressing   Problem: Clinical Measurements: Goal: Ability to maintain clinical measurements within normal limits will improve Outcome: Progressing Goal: Will remain free from infection Outcome: Progressing Goal: Diagnostic test results will improve Outcome: Progressing Goal: Respiratory complications will improve Outcome: Progressing Goal: Cardiovascular complication will be avoided Outcome: Progressing   Problem: Activity: Goal: Risk for activity intolerance will  decrease Outcome: Progressing   Problem: Nutrition: Goal: Adequate nutrition will be maintained Outcome: Progressing   Problem: Coping: Goal: Level of anxiety will decrease Outcome: Progressing   Problem: Elimination: Goal: Will not experience complications related to bowel motility Outcome: Progressing Goal: Will not experience complications related to urinary retention Outcome: Progressing   Problem: Pain Managment: Goal: General experience of comfort will improve and/or be controlled Outcome: Progressing   Problem: Safety: Goal: Ability to remain free from injury will improve Outcome: Progressing   Problem: Skin Integrity: Goal: Risk for impaired skin integrity will decrease Outcome: Progressing

## 2024-04-23 NOTE — Evaluation (Signed)
 Physical Therapy Vestibular Evaluation Patient Details Name: Hailey Hawkins MRN: [id number] DOB: 06/06/1946 Today's Date: 04/23/2024  History of Present Illness  Pt is a 78 y.o. female presented with syncope. PMH of PAF on Eliquis , refractory HTN, mild aortic stenosis, breast cancer stage Ia s/p chemo/immunotherapy/lumpectomy, IDDM, HLD, hypothyroidism, asthma/COPD, B TKR. Recent admission ~2wks ago for similar. Pt is a 78 y.o. female presented with syncope. PMH of PAF on Eliquis , refractory HTN, mild aortic stenosis, breast cancer stage Ia s/p chemo/immunotherapy/lumpectomy, IDDM, HLD, hypothyroidism, asthma/COPD, B TKR. Recent admission ~2wks ago for similar. MD requesting vestibular assessment.   Clinical Impression  Patient received in bed, agreeable to PT vestibular assessment. Patient with good seated balance overall. Reports lightheadedness, and increased dizziness with transitions and quick movements, short duration overall. Denies true vertigo. Patient present with normal oculomotor exam, no saccades noted. Patient did demo increased motion sensitivity, and impaired HIT on R indicating potential R hypofunction. Educated on vestibular input and exercises to target this impairment. Patient and daughter verbalize understanding. D/c recommendation remains appropriate.       If plan is discharge home, recommend the following: A little help with walking and/or transfers;A little help with bathing/dressing/bathroom;Assistance with cooking/housework;Assist for transportation;Help with stairs or ramp for entrance   Can travel by private vehicle   Yes    Equipment Recommendations None recommended by PT  Recommendations for Other Services       Functional Status Assessment Patient has had a recent decline in their functional status and demonstrates the ability to make significant improvements in function in a reasonable and predictable amount of time.     Precautions / Restrictions  Precautions Precautions: None (monitor BP) Recall of Precautions/Restrictions: Intact Restrictions Weight Bearing Restrictions Per Provider Order: No      Mobility  Bed Mobility Overal bed mobility: Modified Independent Bed Mobility: Supine to Sit                Vestibular Asssessmen    Symptom Behavior:   Subjective history: Reports intermittent episodes of dizziness, describes as feeling hot headed, lightheaded or going to pass out.    Non-Vestibular symptoms: tinnitus   Type of dizziness: Imbalance (Disequilibrium), Unsteady with head/body turns, and Lightheadedness/Faint   Frequency: occurs often with transitional movements   Duration: seconds to minutes   Aggravating factors: Induced by position change: supine to sit and sit to stand and Induced by motion: turning head quickly   Relieving factors: rest and slow movements   Progression of symptoms: unchanged   Oculomotor Exam:   Ocular Alignment: normal   Ocular ROM: No Limitations   Spontaneous Nystagmus: absent   Gaze-Induced Nystagmus: absent   Smooth Pursuits: intact   Saccades: intact   Convergence/Divergence: 8 cm    Vestibular-Ocular Reflex (VOR):   Slow VOR: difficulty tracking    VOR Cancellation: Corrective Saccades   Head-Impulse Test: HIT Right: positive HIT Left: negative   Dynamic Visual Acuity:  not able to be assessed due to no snellen eye chart available.    Positional Testing: not warranted; symptom description does not warrant BPPV testing at this time    Motion Sensitivity Quotient  Intensity: 0 = none, 1 = Lightheaded, 2 = Mild, 3 = Moderate, 4 = Severe, 5 = Vomiting  Intensity  1. Sitting to supine 1  2. Supine to L side   3. Supine to R side   4. Supine to sitting 1  5. L Hallpike-Dix   6. Up from L  7. R Hallpike-Dix   8. Up from R    9. Sitting, head  tipped to L knee   10. Head up from L  knee   11. Sitting, head  tipped to R knee   12. Head up from R  knee   13.  Sitting head turns x5   14.Sitting head nods x5   15. In stance, 180  turn to L    16. In stance, 180  turn to R        2 Balance Overall balance assessment: Needs assistance Sitting-balance support: No upper extremity supported, Feet supported, Single extremity supported Sitting balance-Leahy Scale: Good                                       Pertinent Vitals/Pain Pain Assessment Pain Assessment: No/denies pain    Home Living Family/patient expects to be discharged to:: Private residence Living Arrangements: Alone                      Prior Function Prior Level of Function : Independent/Modified Independent;Patient poor historian/Family not available                     Extremity/Trunk Assessment                Communication   Communication Communication: No apparent difficulties    Cognition Arousal: Alert Behavior During Therapy: WFL for tasks assessed/performed   PT - Cognitive impairments: No apparent impairments                         Following commands: Intact       Cueing       General Comments      Exercises Other Exercises Other Exercises: Educated on vestibular input.   Assessment/Plan    PT Assessment Patient needs continued PT services  PT Problem List Decreased strength;Decreased activity tolerance;Decreased balance;Decreased mobility;Decreased cognition;Decreased knowledge of use of DME;Decreased knowledge of precautions;Cardiopulmonary status limiting activity;Pain       PT Treatment Interventions DME instruction;Gait training;Stair training;Functional mobility training;Therapeutic activities;Therapeutic exercise;Balance training;Patient/family education    PT Goals (Current goals can be found in the Care Plan section)  Acute Rehab PT Goals Patient Stated Goal: improve dizziness PT Goal Formulation: With patient Time For Goal Achievement: 05/07/24 Potential to Achieve Goals: Fair     Frequency Min 3X/week     Co-evaluation               AM-PAC PT "6 Clicks" Mobility  Outcome Measure Help needed turning from your back to your side while in a flat bed without using bedrails?: A Little Help needed moving from lying on your back to sitting on the side of a flat bed without using bedrails?: A Little Help needed moving to and from a bed to a chair (including a wheelchair)?: A Little Help needed standing up from a chair using your arms (e.g., wheelchair or bedside chair)?: A Little Help needed to walk in hospital room?: A Little Help needed climbing 3-5 steps with a railing? : A Little 6 Click Score: 18    End of Session   Activity Tolerance: Patient tolerated treatment well Patient left: in bed;with family/visitor present Nurse Communication: Mobility status PT Visit Diagnosis: Unsteadiness on feet (R26.81);Other abnormalities of gait and mobility (R26.89);Muscle weakness (generalized) (M62.81);History of falling (Z91.81);Pain  Time: 1430-1445 PT Time Calculation (min) (ACUTE ONLY): 15 min   Charges:   PT Evaluation $PT Eval Low Complexity: 1 Low   PT General Charges $$ ACUTE PT VISIT: 1 Visit         Quillian Brunt Fairly, PT, DPT 04/23/24 2:59 PM

## 2024-04-23 NOTE — Progress Notes (Signed)
 A consult was placed to the hospital's IV Nurse for new access; staff attempted without success;  attempted 2 more times, using ultrasound; am unable to thread the catheter, or even puncture the vein;  no further attempts made;  pt requests "take a pill instead?"  RN aware.

## 2024-04-23 NOTE — TOC Transition Note (Signed)
 Transition of Care [facility]) - Discharge Note   Patient Details  Name: Hailey Hawkins MRN: [id number] Date of Birth: [birth year]  [facility] CM/SW Contact:  Alexandra Ice, RN Phone Number: 04/23/2024, 3:02 PM   Clinical Narrative:    Patient to discharge home with home health and DME. She has no preference on agencies to use. She needs [facility] sent to [place] with [employer] for processing. Sent referral to [place]  with [facility], accepted for PT/OT services. Added information on AVS. No additional TOC needs identified.    Final next level of care: Home w Home Health Services Barriers to Discharge: Barriers Resolved   Patient Goals and CMS Choice Patient states their goals for this hospitalization and ongoing recovery are:: get better and strong enough to go home. CMS Medicare.gov Compare Post Acute Care list provided to:: Patient Choice offered to / list presented to : Patient [facility] ownership interest in [facility].provided to:: Patient    Discharge Placement                Patient to be transferred to facility by: family Name of family member notified: [name] Patient and family notified of of transfer: 04/23/24  Discharge Plan and Services Additional resources added to the After Visit Summary for       Post Acute Care Choice: Home Health          DME Arranged: Bedside commode DME Agency: [facility] Date DME Agency Contacted: 04/23/24 Time DME Agency Contacted: 1501 Representative spoke with at DME Agency: [facility] Arranged: PT, OT HH Agency: [facility] Date [facility] Agency Contacted: 04/23/24 Time HH Agency Contacted: 1501 Representative spoke with at [facility] Agency: [place]   Social Drivers of Health (SDOH) Interventions SDOH Screenings   Food Insecurity: No Food Insecurity (04/18/2024)  Housing: Low Risk  (04/18/2024)  Transportation Needs: No Transportation Needs (04/18/2024)  Utilities: Not At Risk (04/18/2024)  Financial Resource  Strain: Low Risk  (08/28/2023)   Received from [facility]  Social Connections: Moderately Integrated (04/18/2024)  Tobacco Use: Low Risk  (04/18/2024)     Readmission Risk Interventions     No data to display

## 2024-04-23 NOTE — Discharge Instructions (Signed)
 [facility].  [address] , [place], [place], [address]. [phone] They will call you to arrange when they can come to see you   Private Caregiver Services:  [facility] [address]  [address] [place], [place] [address] [facility]: [phone] [email or web address]  [facility] [phone]

## 2024-04-23 NOTE — Plan of Care (Signed)
  Problem: Education: Goal: Ability to describe self-care measures that may prevent or decrease complications (Diabetes Survival Skills Education) will improve Outcome: Progressing Goal: Individualized Educational Video(s) Outcome: Progressing   Problem: Coping: Goal: Ability to adjust to condition or change in health will improve Outcome: Progressing   Problem: Fluid Volume: Goal: Ability to maintain a balanced intake and output will improve Outcome: Progressing   Problem: Health Behavior/Discharge Planning: Goal: Ability to identify and utilize available resources and services will improve Outcome: Progressing Goal: Ability to manage health-related needs will improve Outcome: Progressing   Problem: Metabolic: Goal: Ability to maintain appropriate glucose levels will improve Outcome: Progressing   Problem: Skin Integrity: Goal: Risk for impaired skin integrity will decrease Outcome: Progressing   Problem: Tissue Perfusion: Goal: Adequacy of tissue perfusion will improve Outcome: Progressing   Problem: Education: Goal: Knowledge of General Education information will improve Description: Including pain rating scale, medication(s)/side effects and non-pharmacologic comfort measures Outcome: Progressing   Problem: Health Behavior/Discharge Planning: Goal: Ability to manage health-related needs will improve Outcome: Progressing   Problem: Clinical Measurements: Goal: Ability to maintain clinical measurements within normal limits will improve Outcome: Progressing Goal: Will remain free from infection Outcome: Progressing Goal: Diagnostic test results will improve Outcome: Progressing Goal: Respiratory complications will improve Outcome: Progressing Goal: Cardiovascular complication will be avoided Outcome: Progressing Pt d/c home with family , AVS reviewed questions answered, IV removed all equipment removed, belongings returned pt escorted of floor by staff. BP (!)  168/116   Pulse 72   Temp 98.2 F (36.8 C)   Resp 17   Ht 5\' 5"  (1.651 m)   Wt 74.8 kg   SpO2 96%   BMI 27.46 kg/m  Anders Katz 04/23/24 3:10 PM

## 2024-04-24 ENCOUNTER — Other Ambulatory Visit: Payer: Self-pay

## 2024-05-26 ENCOUNTER — Other Ambulatory Visit: Payer: Self-pay

## 2024-05-26 ENCOUNTER — Observation Stay
Admission: EM | Admit: 2024-05-26 | Discharge: 2024-05-28 | Disposition: A | Discharge status: Home Health | Attending: Internal Medicine | Admitting: Internal Medicine

## 2024-05-26 DIAGNOSIS — Z96653 Presence of artificial knee joint, bilateral: Secondary | ICD-10-CM | POA: Insufficient documentation

## 2024-05-26 DIAGNOSIS — F418 Other specified anxiety disorders: Secondary | ICD-10-CM

## 2024-05-26 DIAGNOSIS — Z7901 Long term (current) use of anticoagulants: Secondary | ICD-10-CM | POA: Insufficient documentation

## 2024-05-26 DIAGNOSIS — N390 Urinary tract infection, site not specified: Secondary | ICD-10-CM | POA: Insufficient documentation

## 2024-05-26 DIAGNOSIS — I480 Paroxysmal atrial fibrillation: Secondary | ICD-10-CM | POA: Diagnosis not present

## 2024-05-26 DIAGNOSIS — Z794 Long term (current) use of insulin: Secondary | ICD-10-CM | POA: Diagnosis not present

## 2024-05-26 DIAGNOSIS — I10 Essential (primary) hypertension: Secondary | ICD-10-CM | POA: Diagnosis not present

## 2024-05-26 DIAGNOSIS — Z85828 Personal history of other malignant neoplasm of skin: Secondary | ICD-10-CM | POA: Diagnosis not present

## 2024-05-26 DIAGNOSIS — E1100 Type 2 diabetes mellitus with hyperosmolarity without nonketotic hyperglycemic-hyperosmolar coma (NKHHC): Principal | ICD-10-CM

## 2024-05-26 DIAGNOSIS — E1165 Type 2 diabetes mellitus with hyperglycemia: Secondary | ICD-10-CM | POA: Diagnosis not present

## 2024-05-26 DIAGNOSIS — J45909 Unspecified asthma, uncomplicated: Secondary | ICD-10-CM | POA: Insufficient documentation

## 2024-05-26 DIAGNOSIS — R319 Hematuria, unspecified: Secondary | ICD-10-CM | POA: Insufficient documentation

## 2024-05-26 DIAGNOSIS — E785 Hyperlipidemia, unspecified: Secondary | ICD-10-CM | POA: Diagnosis present

## 2024-05-26 DIAGNOSIS — N179 Acute kidney failure, unspecified: Secondary | ICD-10-CM

## 2024-05-26 DIAGNOSIS — Z853 Personal history of malignant neoplasm of breast: Secondary | ICD-10-CM | POA: Insufficient documentation

## 2024-05-26 DIAGNOSIS — R531 Weakness: Secondary | ICD-10-CM | POA: Diagnosis present

## 2024-05-26 DIAGNOSIS — Z7984 Long term (current) use of oral hypoglycemic drugs: Secondary | ICD-10-CM | POA: Diagnosis not present

## 2024-05-26 DIAGNOSIS — E039 Hypothyroidism, unspecified: Secondary | ICD-10-CM

## 2024-05-26 DIAGNOSIS — R413 Other amnesia: Secondary | ICD-10-CM

## 2024-05-26 DIAGNOSIS — R197 Diarrhea, unspecified: Secondary | ICD-10-CM | POA: Diagnosis present

## 2024-05-26 DIAGNOSIS — Z6829 Body mass index (BMI) 29.0-29.9, adult: Secondary | ICD-10-CM | POA: Insufficient documentation

## 2024-05-26 DIAGNOSIS — N3000 Acute cystitis without hematuria: Secondary | ICD-10-CM | POA: Diagnosis not present

## 2024-05-26 DIAGNOSIS — Z79899 Other long term (current) drug therapy: Secondary | ICD-10-CM | POA: Diagnosis not present

## 2024-05-26 DIAGNOSIS — E663 Overweight: Secondary | ICD-10-CM | POA: Diagnosis not present

## 2024-05-26 LAB — BASIC METABOLIC PANEL WITH GFR
Anion gap: 10 (ref 5–15)
Anion gap: 10 (ref 5–15)
BUN: 41 mg/dL — ABNORMAL HIGH (ref 8–23)
BUN: 37 mg/dL — ABNORMAL HIGH (ref 8–23)
CO2: 21 mmol/L — ABNORMAL LOW (ref 22–32)
CO2: 19 mmol/L — ABNORMAL LOW (ref 22–32)
Calcium: 9.7 mg/dL (ref 8.9–10.3)
Calcium: 8.7 mg/dL — ABNORMAL LOW (ref 8.9–10.3)
Chloride: 97 mmol/L — ABNORMAL LOW (ref 98–111)
Chloride: 101 mmol/L (ref 98–111)
Creatinine, Ser: 1.44 mg/dL — ABNORMAL HIGH (ref 0.44–1.00)
Creatinine, Ser: 1.25 mg/dL — ABNORMAL HIGH (ref 0.44–1.00)
GFR, Estimated: 37 mL/min — ABNORMAL LOW (ref >60)
GFR, Estimated: 44 mL/min — ABNORMAL LOW (ref >60)
Glucose, Bld: 663 mg/dL (ref 70–99)
Glucose, Bld: 464 mg/dL — ABNORMAL HIGH (ref 70–99)
Potassium: 3.9 mmol/L (ref 3.5–5.1)
Potassium: 3.4 mmol/L — ABNORMAL LOW (ref 3.5–5.1)
Sodium: 128 mmol/L — ABNORMAL LOW (ref 135–145)
Sodium: 130 mmol/L — ABNORMAL LOW (ref 135–145)

## 2024-05-26 LAB — CBG MONITORING, ED
Glucose-Capillary: >600 mg/dL (ref 70–99)
Glucose-Capillary: 480 mg/dL — ABNORMAL HIGH (ref 70–99)
Glucose-Capillary: 473 mg/dL — ABNORMAL HIGH (ref 70–99)
Glucose-Capillary: 323 mg/dL — ABNORMAL HIGH (ref 70–99)

## 2024-05-26 LAB — CBC
HCT: 40 % (ref 36.0–46.0)
Hemoglobin: 13.5 g/dL (ref 12.0–15.0)
MCH: 28.7 pg (ref 26.0–34.0)
MCHC: 33.8 g/dL (ref 30.0–36.0)
MCV: 85.1 fL (ref 80.0–100.0)
Platelets: 221 10*3/uL (ref 150–400)
RBC: 4.7 MIL/uL (ref 3.87–5.11)
RDW: 12.9 % (ref 11.5–15.5)
WBC: 8.6 10*3/uL (ref 4.0–10.5)
nRBC: 0 % (ref 0.0–0.2)

## 2024-05-26 LAB — GLUCOSE, CAPILLARY: Glucose-Capillary: 214 mg/dL — ABNORMAL HIGH (ref 70–99)

## 2024-05-26 LAB — URINALYSIS, ROUTINE W REFLEX MICROSCOPIC
Bilirubin Urine: NEGATIVE
Glucose, UA: >=500 mg/dL — AB
Ketones, ur: 5 mg/dL — AB
Nitrite: NEGATIVE
Protein, ur: 30 mg/dL — AB
RBC / HPF: >50 RBC/hpf (ref 0–5)
Specific Gravity, Urine: 1.023 (ref 1.005–1.030)
WBC, UA: >50 WBC/hpf (ref 0–5)
pH: 5 (ref 5.0–8.0)

## 2024-05-26 LAB — OSMOLALITY: Osmolality: 318 mosm/kg — ABNORMAL HIGH (ref 275–295)

## 2024-05-26 LAB — BETA-HYDROXYBUTYRIC ACID: Beta-Hydroxybutyric Acid: 1.61 mmol/L — ABNORMAL HIGH (ref 0.05–0.27)

## 2024-05-26 MED ORDER — DEXTROSE 50 % IV SOLN: 0.0000 mL | INTRAVENOUS | Status: DC | PRN

## 2024-05-26 MED ORDER — CHLORHEXIDINE GLUCONATE CLOTH 2 % EX PADS
6.0000 | MEDICATED_PAD | Freq: Every day | CUTANEOUS | Status: DC
  Administered 2024-05-26 – 2024-05-27 (×2): 6 via TOPICAL

## 2024-05-26 MED ORDER — ALBUTEROL SULFATE (2.5 MG/3ML) 0.083% IN NEBU: 3.0000 mL | INHALATION_SOLUTION | RESPIRATORY_TRACT | Status: DC | PRN

## 2024-05-26 MED ORDER — HYDRALAZINE HCL 20 MG/ML IJ SOLN: 5.0000 mg | INTRAMUSCULAR | Status: DC | PRN

## 2024-05-26 MED ORDER — INSULIN REGULAR(HUMAN) IN NACL 100-0.9 UT/100ML-% IV SOLN
INTRAVENOUS | Status: DC
  Administered 2024-05-26: 11.5 [IU]/h via INTRAVENOUS
  Filled 2024-05-26: qty 100

## 2024-05-26 MED ORDER — ONDANSETRON HCL 4 MG/2ML IJ SOLN: 4.0000 mg | Freq: Three times a day (TID) | INTRAMUSCULAR | Status: DC | PRN

## 2024-05-26 MED ORDER — DEXTROSE IN LACTATED RINGERS 5 % IV SOLN: INTRAVENOUS | Status: DC

## 2024-05-26 MED ORDER — SODIUM CHLORIDE 0.9 % IV SOLN
2.0000 g | INTRAVENOUS | Status: DC
  Administered 2024-05-26 – 2024-05-27 (×2): 2 g via INTRAVENOUS
  Filled 2024-05-26 (×2): qty 20

## 2024-05-26 MED ORDER — POTASSIUM CHLORIDE 10 MEQ/100ML IV SOLN: 10.0000 meq | INTRAVENOUS | Status: DC

## 2024-05-26 MED ORDER — DEXTROSE 50 % IV SOLN
0.0000 mL | INTRAVENOUS | Status: DC | PRN
  Administered 2024-05-27: 40 mL via INTRAVENOUS
  Filled 2024-05-26: qty 50

## 2024-05-26 MED ORDER — LACTATED RINGERS IV SOLN: INTRAVENOUS | Status: DC

## 2024-05-26 MED ORDER — INSULIN REGULAR(HUMAN) IN NACL 100-0.9 UT/100ML-% IV SOLN: INTRAVENOUS | Status: DC

## 2024-05-26 MED ORDER — POTASSIUM CHLORIDE 10 MEQ/100ML IV SOLN
10.0000 meq | INTRAVENOUS | Status: AC
  Administered 2024-05-26 (×2): 10 meq via INTRAVENOUS
  Filled 2024-05-26 (×2): qty 100

## 2024-05-26 MED ORDER — LACTATED RINGERS IV BOLUS: 20.0000 mL/kg | Freq: Once | INTRAVENOUS | Status: DC

## 2024-05-26 MED ORDER — SODIUM CHLORIDE 0.9 % IV BOLUS
2000.0000 mL | Freq: Once | INTRAVENOUS | Status: AC
Start: 2024-05-26 — End: 2024-05-26
  Administered 2024-05-26: 2000 mL via INTRAVENOUS

## 2024-05-26 MED ORDER — SODIUM CHLORIDE 0.9 % IV SOLN: Freq: Once | INTRAVENOUS | Status: AC

## 2024-05-26 MED ORDER — SODIUM CHLORIDE 0.9 % IV SOLN: 2.0000 g | Freq: Once | INTRAVENOUS | Status: DC

## 2024-05-26 MED ORDER — DM-GUAIFENESIN ER 30-600 MG PO TB12: 1.0000 | ORAL_TABLET | Freq: Two times a day (BID) | ORAL | Status: DC | PRN

## 2024-05-26 MED ORDER — ACETAMINOPHEN 325 MG PO TABS: 650.0000 mg | ORAL_TABLET | Freq: Four times a day (QID) | ORAL | Status: DC | PRN

## 2024-05-26 NOTE — ED Triage Notes (Signed)
 Patient states nausea, dizziness and weakness for a week; saw PCP today and was told to come to ED for blood sugar >600.

## 2024-05-26 NOTE — ED Notes (Signed)
 Patient was found to be wet when it was time for dose change on insulin .  She had previously been able to notify staff and get to a toilet.  Patient was cleaned up and her clothing placed in a belongings bag. Bed was cleaned and linens replaced.  Bed alarm reactivated.

## 2024-05-26 NOTE — ED Provider Notes (Signed)
 [facility] Provider Note    Event Date/Time   First MD Initiated Contact with Patient 05/26/24 1837     (approximate)   History   Weakness  Patient states nausea, dizziness and weakness for a week; saw PCP today and was told to come to ED for blood sugar >600.   HPI [name] is a 78 y.o. female PMH poorly controlled T2DM, SIADH, hypertension, breast cancer status post chemo/immunotherapy, asthma/COPD presents for evaluation of hyperglycemia, fatigue, diffuse bodyaches - Patient states she has not been feeling well for the past week or so.  Notes fatigue and diffuse bodyaches.  Limited historian.  Collateral gathered from husband and son at bedside.  Appears patient has not been taking her insulin  for the past 2-3 weeks, husband was supposed to be helping her with this but apparently was not able to adequately do so. - No recent fever, cough, chest pain, abdominal pain, nausea/vomiting/diarrhea, shortness of breath, urinary symptoms - Is at her baseline mental state  Per chart review, last admitted last month for severe hyperglycemia and lightheadedness.       Physical Exam   Triage Vital Signs: ED Triage Vitals [05/26/24 1600]  Encounter Vitals Group     BP 129/80     Systolic BP Percentile      Diastolic BP Percentile      Pulse Rate (!) 104     Resp 20     Temp 98.2 F (36.8 C)     Temp Source Oral     SpO2 94 %     Weight      Height      Head Circumference      Peak Flow      Pain Score 0     Pain Loc      Pain Education      Exclude from Growth Chart     Most recent vital signs: Vitals:   05/26/24 2230 05/26/24 2339  BP: (!) 151/103 (!) 160/93  Pulse: (!) 157 95  Resp: (!) 21 15  Temp:  97.7 F (36.5 C)  SpO2: 100% 98%     General: Awake, no distress.  CV:  Good peripheral perfusion.  Mild tachycardia, regular rhythm, RP 2+ Resp:  Normal effort. CTAB Abd:  No distention. Nontender to deep palpation  throughout Neuro: Alert, interactive, unclear orientation, responds to questions appropriately, moves all extremities spontaneously, no focal motor deficit appreciated   ED Results / Procedures / Treatments   Labs (all labs ordered are listed, but only abnormal results are displayed) Labs Reviewed  BASIC METABOLIC PANEL WITH GFR - Abnormal; Notable for the following components:      Result Value   Sodium 128 (*)    Chloride 97 (*)    CO2 21 (*)    Glucose, Bld 663 (*)    BUN 41 (*)    Creatinine, Ser 1.44 (*)    GFR, Estimated 37 (*)    All other components within normal limits  URINALYSIS, ROUTINE W REFLEX MICROSCOPIC - Abnormal; Notable for the following components:   Color, Urine YELLOW (*)    APPearance CLOUDY (*)    Glucose, UA >=500 (*)    Hgb urine dipstick SMALL (*)    Ketones, ur 5 (*)    Protein, ur 30 (*)    Leukocytes,Ua LARGE (*)    Bacteria, UA RARE (*)    All other components within normal limits  BLOOD GAS, VENOUS - Abnormal; Notable  for the following components:   Acid-base deficit 3.0 (*)    All other components within normal limits  BETA-HYDROXYBUTYRIC ACID - Abnormal; Notable for the following components:   Beta-Hydroxybutyric Acid 1.61 (*)    All other components within normal limits  OSMOLALITY - Abnormal; Notable for the following components:   Osmolality 318 (*)    All other components within normal limits  BASIC METABOLIC PANEL WITH GFR - Abnormal; Notable for the following components:   Sodium 130 (*)    Potassium 3.4 (*)    CO2 19 (*)    Glucose, Bld 464 (*)    BUN 37 (*)    Creatinine, Ser 1.25 (*)    Calcium  8.7 (*)    GFR, Estimated 44 (*)    All other components within normal limits  GLUCOSE, CAPILLARY - Abnormal; Notable for the following components:   Glucose-Capillary 214 (*)    All other components within normal limits  CBG MONITORING, ED - Abnormal; Notable for the following components:   Glucose-Capillary >600 (*)    All other  components within normal limits  CBG MONITORING, ED - Abnormal; Notable for the following components:   Glucose-Capillary 480 (*)    All other components within normal limits  CBG MONITORING, ED - Abnormal; Notable for the following components:   Glucose-Capillary 473 (*)    All other components within normal limits  CBG MONITORING, ED - Abnormal; Notable for the following components:   Glucose-Capillary 323 (*)    All other components within normal limits  URINE CULTURE  C DIFFICILE QUICK SCREEN W PCR REFLEX    MRSA NEXT GEN BY PCR, NASAL  CBC  BASIC METABOLIC PANEL WITH GFR  BASIC METABOLIC PANEL WITH GFR  BASIC METABOLIC PANEL WITH GFR  BASIC METABOLIC PANEL WITH GFR     EKG  Ecg = sinus rhythm, rate 96, no ST elevation or depression, no significant repolarization abnormality, + left axis deviation, normal intervals.  No evidence of ischemia on my read.   RADIOLOGY N/a    PROCEDURES:  Critical Care performed: No  Procedures   MEDICATIONS ORDERED IN ED: Medications  insulin  regular, human (MYXREDLIN) 100 units/ 100 mL infusion (7 Units/hr Intravenous Rate/Dose Change 05/26/24 2234)  dextrose  5 % in lactated ringers  infusion (0 mLs Intravenous Hold 05/26/24 2058)  cefTRIAXone  (ROCEPHIN ) 2 g in sodium chloride  0.9 % 100 mL IVPB (0 g Intravenous Stopped 05/26/24 2204)  albuterol  (PROVENTIL ) (2.5 MG/3ML) 0.083% nebulizer solution 3 mL (has no administration in time range)  dextromethorphan-guaiFENesin (MUCINEX DM) 30-600 MG per 12 hr tablet 1 tablet (has no administration in time range)  ondansetron  (ZOFRAN ) injection 4 mg (has no administration in time range)  hydrALAZINE  (APRESOLINE ) injection 5 mg (has no administration in time range)  acetaminophen  (TYLENOL ) tablet 650 mg (has no administration in time range)  dextrose  50 % solution 0-50 mL (has no administration in time range)  Chlorhexidine  Gluconate Cloth 2 % PADS 6 each (6 each Topical Given 05/26/24 2346)  sodium  chloride 0.9 % bolus 2,000 mL (0 mLs Intravenous Stopped 05/26/24 2053)  potassium chloride  10 mEq in 100 mL IVPB (10 mEq Intravenous New Bag/Given 05/26/24 2209)  0.9 %  sodium chloride  infusion ( Intravenous New Bag/Given 05/26/24 2126)     IMPRESSION / MDM / ASSESSMENT AND PLAN / ED COURSE  I reviewed the triage vital signs and the nursing notes.  DDX/MDM/AP: Differential diagnosis includes, but is not limited to, HHS, DKA, uncomplicated hyperglycemia.  Appears to be secondary to medication nonadherence, doubt underlying infectious etiology at this time.  Will initiate IV fluid, follow awesome's and determine whether to give single dose of insulin  or initiate insulin  drip.  Anticipate need for admission, would also likely benefit from further care coordination to ensure able to adhere to medication regimen at home.  Plan: - Labs  -IV fluid - Insulin  - Anticipate admission  Patient's presentation is most consistent with acute presentation with potential threat to life or bodily function.  The patient is on the cardiac monitor to evaluate for evidence of arrhythmia and/or significant heart rate changes.  ED course below.  Workup overall concerning for HHS with glucose greater than 600, small ketones, bicarb greater than 20.  Serum Osmo greater than 320.  Received 2 L IV fluid and started on insulin  drip.  Labs also showed AKI as well as evidence of underlying UTI, treated with ceftriaxone .  Admitted to hospitalist service.  Clinical Course as of 05/26/24 2348  Thu May 26, 2024  1840 CBC unremarkable  BMP with marked hyperglycemia to 663, bicarb only mildly low at 21, anion gap normal at 10.  AKI present. [MM]  2043 Urinalysis concerning for UTI, will treat [MM]  2044 Hospitalist consult order placed [MM]    Clinical Course User Index [MM] Collis Deaner, MD     FINAL CLINICAL IMPRESSION(S) / ED DIAGNOSES   Final diagnoses:  Hyperosmolar  hyperglycemic state (HHS) (HCC)  AKI (acute kidney injury) (HCC)  Urinary tract infection with hematuria, site unspecified     Rx / DC Orders   ED Discharge Orders     None        Note:  This document was prepared using Dragon voice recognition software and may include unintentional dictation errors.   Collis Deaner, MD 05/26/24 [id number]

## 2024-05-26 NOTE — H&P (Signed)
 History and Physical    Hailey Hawkins UJW:[id number] DOB: 1946/01/18 DOA: 05/26/2024  Referring MD/NP/PA:   PCP: Yehuda Helms, MD   Patient coming from:  The patient is coming from home.     Chief Complaint: Weakness, diarrhea  HPI: Hailey Hawkins is a 78 y.o. 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  Patient states that in the past 4 days, she has not been feeling well.  She has nausea, diarrhea, no vomiting or abdominal pain.  She has generalized weakness and lightheadedness.  No fever or chills.  She has 3-4 times of diarrhea each day.  No chest pain, cough, SOB.  She has polyuria and polydipsia, no dysuria or hematuria.  Per report, patient has not been taking her insulin  in the past several weeks.  Patient has memory loss. Her husband was supposed to be helping her with her insulin  injection but apparently he was not able to adequately do so.   Data reviewed independently and ED Course: pt was found to have HHS ( with blood sugar 663, bicarbonate 21, anion gap 10, beta hydroxybutyric acid 1.61, ketone 5 in UA), VBG with pH 7.28, CO2 52, O2 46.  WBC 8.6, UA (cloudy appearance, large amount of leukocyte, rare bacteria, WBC > 50), AKI with creatinine 1.44, BUN 41 and GFR 37 (recent baseline creatinine 0.93), temperature normal, blood pressure 169/92, heart rate of 104, RR 20, oxygen saturation 99% on room air.  Patient is placed in stepdown for observation.   EKG: I have personally reviewed.  A-fib, QTc 452, LAD, poor R wave progression.   Review of Systems:   General: no fevers, chills, no body weight gain, has fatigue HEENT: no blurry vision, hearing changes or sore throat Respiratory: no dyspnea, coughing, wheezing CV: no chest pain, no  palpitations GI: has nausea, no vomiting, abdominal pain, has diarrhea, no constipation GU: no dysuria, burning on urination, has increased urinary frequency, no hematuria  Ext: no leg edema Neuro: no unilateral weakness, numbness, or tingling, no vision change or hearing loss Skin: no rash, no skin tear. MSK: No muscle spasm, no deformity, no limitation of range of movement in spin Heme: No easy bruising.  Travel history: No recent long distant travel.   Allergy:  Allergies  Allergen Reactions   Methamphetamine Shortness Of Breath    Difficulty breathing (INHALER)   Amoxicillin     Other reaction(s): Diarrhea and vomiting (finding)   Antihistamines, Chlorpheniramine-Type     Other reaction(s): Other (See Comments) Severe dryness   Ciprofloxacin     Other reaction(s): Distress (finding)   Codeine     Other reaction(s): Diarrhea and vomiting (finding), Weal or any derivitive   Other Other (See Comments)    Birth control pills - blood clots Birth control pills - blood clots   Oxycodone-Acetaminophen  Nausea And Vomiting   Penicillin G     Other reaction(s): Weal   Meloxicam Palpitations    Past Medical History:  Diagnosis Date   Anemia    pernicious   Asthma    Back pain    Basal cell carcinoma 03/17/2023   mid nasal dorsum, Mohs 05/06/2023   Breast cancer (HCC) 2015   right c lumpectomy c radiation   DCIS (ductal carcinoma in situ) of breast    Diabetes mellitus without complication (  HCC)    Elevated cholesterol    GERD (gastroesophageal reflux disease)    Hashimoto's disease    Hypertension    Hypothyroidism    MRSA infection    Obesity goither   Personal history of radiation therapy 2015   right breast DCIS   SIADH (syndrome of inappropriate ADH production) (HCC)    SOB (shortness of breath)    Tachycardia     Past Surgical History:  Procedure Laterality Date   ABDOMINAL HYSTERECTOMY     BREAST BIOPSY Right 11/2013   DCIS   BREAST LUMPECTOMY Right  2015   DCIS, clear margins   CESAREAN SECTION     x2   colononoscopy     COLONOSCOPY N/A 08/14/2016   Procedure: COLONOSCOPY;  Surgeon: Cassie Click, MD;  Location: [facility] ENDOSCOPY;  Service: Endoscopy;  Laterality: N/A;   COLONOSCOPY WITH ESOPHAGOGASTRODUODENOSCOPY (EGD)     ESOPHAGOGASTRODUODENOSCOPY (EGD) WITH PROPOFOL  N/A 08/14/2016   Procedure: ESOPHAGOGASTRODUODENOSCOPY (EGD) WITH PROPOFOL ;  Surgeon: Cassie Click, MD;  Location: [facility] ENDOSCOPY;  Service: Endoscopy;  Laterality: N/A;   INCISION AND DRAINAGE ABSCESS Left 10/18/2021   Procedure: INCISION AND DRAINAGE ABSCESS;  Surgeon: Eldred Grego, MD;  Location: [facility] ORS;  Service: General;  Laterality: Left;  Right labia   MASTECTOMY     MASTECTOMY, PARTIAL Right    REPLACEMENT TOTAL KNEE Right    REPLACEMENT TOTAL KNEE Left    THYROIDECTOMY, PARTIAL     caused throat paralysis  right side   TONSILLECTOMY      Social History:  reports that she has never smoked. She has never used smokeless tobacco. She reports that she does not drink alcohol and does not use drugs.  Family History:  Family History  Problem Relation Age of Onset   Cancer Daughter        colon cancer; age 26   Colon cancer Daughter    Breast cancer Neg Hx      Prior to Admission medications   Medication Sig Start Date End Date Taking? Authorizing Provider  acetaminophen  (TYLENOL ) 500 MG tablet Take 1,000 mg by mouth every 6 (six) hours as needed for mild pain.    [provider]  albuterol  (PROVENTIL  HFA;VENTOLIN  HFA) 108 (90 BASE) MCG/ACT inhaler Inhale 2 puffs into the lungs every 4 (four) hours as needed for wheezing or shortness of breath.    [provider]  ALPRAZolam  (XANAX ) 0.5 MG tablet Take 0.5 mg by mouth at bedtime as needed for sleep.    [provider]  apixaban  (ELIQUIS ) 5 MG TABS tablet Take 5 mg by mouth 2 (two) times daily.    [provider]  atorvastatin  (LIPITOR) 10 MG tablet Take 10 mg  by mouth daily.  11/04/16 04/18/24  [provider]  cloNIDine  (CATAPRES ) 0.2 MG tablet Take 0.5 tablets (0.1 mg total) by mouth 2 (two) times daily as needed (SBP >160 despite propranolol ). 04/23/24 05/23/24  Lorita Rosa, MD  desvenlafaxine (PRISTIQ) 25 MG 24 hr tablet Take 25 mg by mouth daily. 12/01/23 11/30/24  [provider]  empagliflozin  (JARDIANCE ) 25 MG TABS tablet Take 25 mg by mouth daily. 03/18/24   [provider]  glucose blood (FREESTYLE LITE) test strip Use 2 (two) times daily 07/29/19   [provider]  insulin  glargine (LANTUS ) 100 UNIT/ML injection Inject 0.25 mLs (25 Units total) into the skin at bedtime. 04/23/24   Lorita Rosa, MD  Insulin  Pen Needle (NOVOFINE) 30G X 8 MM MISC as directed  To inject insulin . 07/29/19   [provider]  Insulin  Syringe-Needle U-100 (INSULIN  SYRINGE .5CC/31GX5/16") 31G X 5/16" 0.5 ML MISC Use 1 syringe daily for insulin  injections 04/26/18   [provider]  isosorbide  mononitrate (IMDUR ) 30 MG 24 hr tablet Take 30 mg by mouth 2 (two) times daily. 12/14/19 04/18/24  [provider]  levothyroxine  (SYNTHROID ) 75 MCG tablet Take 75 mcg by mouth daily before breakfast. 11/12/19   [provider]  metFORMIN (GLUCOPHAGE) 1000 MG tablet Take 1,000 mg by mouth 2 (two) times daily with a meal.    [provider]  omeprazole (PRILOSEC) 20 MG capsule Take 20 mg by mouth 2 (two) times daily before a meal. 11/02/19   [provider]  OXYGEN Inhale 2 L into the lungs at bedtime.    [provider]  propranolol  (INDERAL ) 20 MG tablet Take 1 tablet by mouth 2 (two) times daily. 02/26/24 02/25/25  [provider]  psyllium (HYDROCIL/METAMUCIL) 95 % PACK Take 1 packet by mouth daily. 04/23/24   Lorita Rosa, MD  QUEtiapine  (SEROQUEL ) 50 MG tablet Take 50 mg by mouth at bedtime. 12/08/20   [provider]    Physical Exam: Vitals:   05/26/24 1600  05/26/24 1900 05/26/24 2051 05/26/24 2059  BP: 129/80 (!) 169/92    Pulse: (!) 104 88    Resp: 20 19    Temp: 98.2 F (36.8 C)   98.2 F (36.8 C)  TempSrc: Oral   Oral  SpO2: 94% 99%    Weight:   77.2 kg    General: Not in acute distress.  Dry mucous membrane HEENT:       Eyes: PERRL, EOMI, no jaundice       ENT: No discharge from the ears and nose, no pharynx injection, no tonsillar enlargement.        Neck: No JVD, no bruit, no mass felt. Heme: No neck lymph node enlargement. Cardiac: S1/S2, irregularly irregular rhythm, no gallops or rubs. Respiratory: No rales, wheezing, rhonchi or rubs. GI: Soft, nondistended, nontender, no rebound pain, no organomegaly, BS present. GU: No hematuria Ext: No pitting leg edema bilaterally. 1+DP/PT pulse bilaterally. Musculoskeletal: No joint deformities, No joint redness or warmth, no limitation of ROM in spin. Skin: No rashes.  Neuro: Alert, answered all questions appropriately, oriented X3, cranial nerves II-XII grossly intact, moves all extremities normally. Psych: Patient is not psychotic, no suicidal or hemocidal ideation.  Labs on Admission: I have personally reviewed following labs and imaging studies  CBC: Recent Labs  Lab 05/26/24 1603  WBC 8.6  HGB 13.5  HCT 40.0  MCV 85.1  PLT 221   Basic Metabolic Panel: Recent Labs  Lab 05/26/24 1603  NA 128*  K 3.9  CL 97*  CO2 21*  GLUCOSE 663*  BUN 41*  CREATININE 1.44*  CALCIUM  9.7   GFR: Estimated Creatinine Clearance: 33.1 mL/min (A) (by C-G formula based on SCr of 1.44 mg/dL (H)). Liver Function Tests: No results for input(s): "AST", "ALT", "ALKPHOS", "BILITOT", "PROT", "ALBUMIN" in the last 168 hours. No results for input(s): "LIPASE", "AMYLASE" in the last 168 hours. No results for input(s): "AMMONIA" in the last 168 hours. Coagulation Profile: No results for input(s): "INR", "PROTIME" in the last 168 hours. Cardiac Enzymes: No results for input(s): "CKTOTAL",  "CKMB", "CKMBINDEX", "TROPONINI" in the last 168 hours. BNP (last 3 results) No results for input(s): "PROBNP" in the last 8760 hours. HbA1C: No results for input(s): "HGBA1C" in the last 72  hours. CBG: Recent Labs  Lab 05/26/24 1606 05/26/24 2118  GLUCAP >600* 480*   Lipid Profile: No results for input(s): "CHOL", "HDL", "LDLCALC", "TRIG", "CHOLHDL", "LDLDIRECT" in the last 72 hours. Thyroid  Function Tests: No results for input(s): "TSH", "T4TOTAL", "FREET4", "T3FREE", "THYROIDAB" in the last 72 hours. Anemia Panel: No results for input(s): "VITAMINB12", "FOLATE", "FERRITIN", "TIBC", "IRON", "RETICCTPCT" in the last 72 hours. Urine analysis:    Component Value Date/Time   COLORURINE YELLOW (A) 05/26/2024 2013   APPEARANCEUR CLOUDY (A) 05/26/2024 2013   APPEARANCEUR Clear 04/20/2013 1513   LABSPEC 1.023 05/26/2024 2013   LABSPEC 1.027 04/20/2013 1513   PHURINE 5.0 05/26/2024 2013   GLUCOSEU >=500 (A) 05/26/2024 2013   GLUCOSEU >=500 04/20/2013 1513   HGBUR SMALL (A) 05/26/2024 2013   BILIRUBINUR NEGATIVE 05/26/2024 2013   BILIRUBINUR Negative 04/20/2013 1513   KETONESUR 5 (A) 05/26/2024 2013   PROTEINUR 30 (A) 05/26/2024 2013   NITRITE NEGATIVE 05/26/2024 2013   LEUKOCYTESUR LARGE (A) 05/26/2024 2013   LEUKOCYTESUR 1+ 04/20/2013 1513   Sepsis Labs: @LABRCNTIP (procalcitonin:4,lacticidven:4) )No results found for this or any previous visit (from the past 240 hours).   Radiological Exams on Admission:   Assessment/Plan Principal Problem:   Hyperosmolar hyperglycemic state (HHS) (HCC) Active Problems:   Uncontrolled type 2 diabetes mellitus with hyperglycemia, with long-term current use of insulin  (HCC)   UTI (urinary tract infection)   Essential hypertension   Paroxysmal atrial fibrillation (HCC)   AKI (acute kidney injury) (HCC)   HLD (hyperlipidemia)   Acquired hypothyroidism   Memory deficits   Depression with anxiety   Overweight (BMI 25.0-29.9)    Diarrhea   Assessment and Plan:  Hyperosmolar hyperglycemic state (HHS):   pt was found to have HHS with blood sugar 663, bicarbonate 21, anion gap 10, beta hydroxybutyric acid 1.61, ketone 5 in UA), VBG with pH 7.28.  Mental status is at baseline.  - Place in SDU for obs - IVF:  2L of NS bolus, and 1.5 L of LR in ED - start insulin  drip with BMP q4h - IVF: LR at 125 cc/h, will switch to D5-LR at 125 cc/h when CBG<250 - replete K as needed - Zofran  prn nausea  - NPO  - consult to diabetic educator - TOC consult  Uncontrolled type 2 diabetes mellitus with hyperglycemia, with long-term current use of insulin  [facility]): Recent A1c 7.4, poorly controlled.  Patient supposed to take Januvia, metformin, Lantus  25 unit daily, but patient has not been doing this appropriately. - On insulin  drip as above  UTI (urinary tract infection) -IV Rocephin  - Follow-up urine culture  Essential hypertension: -Hydralazine  as needed - Clonidine , Imdur , propranolol   Paroxysmal atrial fibrillation (HCC) -Propranolol  - Eliquis   AKI (acute kidney injury) (HCC): Likely due to combination of dehydration and UTI -IV fluids above  HLD (hyperlipidemia) -Lipitor  Acquired hypothyroidism -Synthroid   Memory deficits -Fall precaution  Depression with anxiety -Continue home medications  Overweight (BMI 25.0-29.9): Body weight 81.6 kg, BMI 29.05 -Encourage to lose some weight -Has discussed healthy diet  Diarrhea - Follow-up C. difficile test    DVT ppx: on Eliquis   Code Status: Full code   Family Communication:     not done, no family member is at bed side.       Disposition Plan:  Anticipate discharge back to previous environment  Consults called:  none  Admission status and Level of care: Stepdown:    for obs as inpt        Dispo:  The patient is from: Home              Anticipated d/c is to: Home              Anticipated d/c date is: 1 day              Patient currently is not  medically stable to d/c.    Severity of Illness:  The appropriate patient status for this patient is OBSERVATION. Observation status is judged to be reasonable and necessary in order to provide the required intensity of service to ensure the patient's safety. The patient's presenting symptoms, physical exam findings, and initial radiographic and laboratory data in the context of their medical condition is felt to place them at decreased risk for further clinical deterioration. Furthermore, it is anticipated that the patient will be medically stable for discharge from the hospital within 2 midnights of admission.        Date of Service 05/26/2024    Fidencio Hue [facility]   If 7PM-7AM, please contact night-coverage [email or web address] 05/26/2024, 9:36 PM

## 2024-05-27 DIAGNOSIS — E1100 Type 2 diabetes mellitus with hyperosmolarity without nonketotic hyperglycemic-hyperosmolar coma (NKHHC): Secondary | ICD-10-CM | POA: Diagnosis not present

## 2024-05-27 DIAGNOSIS — E1165 Type 2 diabetes mellitus with hyperglycemia: Secondary | ICD-10-CM | POA: Diagnosis not present

## 2024-05-27 DIAGNOSIS — N179 Acute kidney failure, unspecified: Secondary | ICD-10-CM | POA: Diagnosis not present

## 2024-05-27 DIAGNOSIS — N3000 Acute cystitis without hematuria: Secondary | ICD-10-CM | POA: Diagnosis not present

## 2024-05-27 LAB — BASIC METABOLIC PANEL WITH GFR
Anion gap: 11 (ref 5–15)
Anion gap: 11 (ref 5–15)
Anion gap: 9 (ref 5–15)
BUN: 32 mg/dL — ABNORMAL HIGH (ref 8–23)
BUN: 30 mg/dL — ABNORMAL HIGH (ref 8–23)
BUN: 25 mg/dL — ABNORMAL HIGH (ref 8–23)
CO2: 20 mmol/L — ABNORMAL LOW (ref 22–32)
CO2: 17 mmol/L — ABNORMAL LOW (ref 22–32)
CO2: 22 mmol/L (ref 22–32)
Calcium: 9.1 mg/dL (ref 8.9–10.3)
Calcium: 8.7 mg/dL — ABNORMAL LOW (ref 8.9–10.3)
Calcium: 8.9 mg/dL (ref 8.9–10.3)
Chloride: 105 mmol/L (ref 98–111)
Chloride: 106 mmol/L (ref 98–111)
Chloride: 103 mmol/L (ref 98–111)
Creatinine, Ser: 1.04 mg/dL — ABNORMAL HIGH (ref 0.44–1.00)
Creatinine, Ser: 0.95 mg/dL (ref 0.44–1.00)
Creatinine, Ser: 0.86 mg/dL (ref 0.44–1.00)
GFR, Estimated: 55 mL/min — ABNORMAL LOW (ref >60)
GFR, Estimated: >60 mL/min (ref >60)
GFR, Estimated: >60 mL/min (ref >60)
Glucose, Bld: 132 mg/dL — ABNORMAL HIGH (ref 70–99)
Glucose, Bld: 154 mg/dL — ABNORMAL HIGH (ref 70–99)
Glucose, Bld: 126 mg/dL — ABNORMAL HIGH (ref 70–99)
Potassium: 4 mmol/L (ref 3.5–5.1)
Potassium: 3 mmol/L — ABNORMAL LOW (ref 3.5–5.1)
Potassium: 3.8 mmol/L (ref 3.5–5.1)
Sodium: 136 mmol/L (ref 135–145)
Sodium: 134 mmol/L — ABNORMAL LOW (ref 135–145)
Sodium: 134 mmol/L — ABNORMAL LOW (ref 135–145)

## 2024-05-27 LAB — GLUCOSE, CAPILLARY
Glucose-Capillary: 132 mg/dL — ABNORMAL HIGH (ref 70–99)
Glucose-Capillary: 143 mg/dL — ABNORMAL HIGH (ref 70–99)
Glucose-Capillary: 144 mg/dL — ABNORMAL HIGH (ref 70–99)
Glucose-Capillary: 150 mg/dL — ABNORMAL HIGH (ref 70–99)
Glucose-Capillary: 151 mg/dL — ABNORMAL HIGH (ref 70–99)
Glucose-Capillary: 154 mg/dL — ABNORMAL HIGH (ref 70–99)
Glucose-Capillary: 155 mg/dL — ABNORMAL HIGH (ref 70–99)
Glucose-Capillary: 159 mg/dL — ABNORMAL HIGH (ref 70–99)
Glucose-Capillary: 162 mg/dL — ABNORMAL HIGH (ref 70–99)
Glucose-Capillary: 181 mg/dL — ABNORMAL HIGH (ref 70–99)
Glucose-Capillary: 185 mg/dL — ABNORMAL HIGH (ref 70–99)
Glucose-Capillary: 208 mg/dL — ABNORMAL HIGH (ref 70–99)
Glucose-Capillary: 240 mg/dL — ABNORMAL HIGH (ref 70–99)
Glucose-Capillary: 276 mg/dL — ABNORMAL HIGH (ref 70–99)
Glucose-Capillary: 65 mg/dL — ABNORMAL LOW (ref 70–99)

## 2024-05-27 LAB — MAGNESIUM: Magnesium: 1.3 mg/dL — ABNORMAL LOW (ref 1.7–2.4)

## 2024-05-27 LAB — BLOOD GAS, VENOUS
Acid-base deficit: 3 mmol/L — ABNORMAL HIGH (ref 0.0–2.0)
Bicarbonate: 24.4 mmol/L (ref 20.0–28.0)
O2 Saturation: 46 %
Patient temperature: 37
pCO2, Ven: 52 mmHg (ref 44–60)
pH, Ven: 7.28 (ref 7.25–7.43)

## 2024-05-27 LAB — MRSA NEXT GEN BY PCR, NASAL: MRSA by PCR Next Gen: NOT DETECTED

## 2024-05-27 MED ORDER — PROPRANOLOL HCL 20 MG PO TABS
20.0000 mg | ORAL_TABLET | Freq: Two times a day (BID) | ORAL | Status: DC
  Administered 2024-05-27 – 2024-05-28 (×4): 20 mg via ORAL
  Filled 2024-05-27 (×4): qty 1

## 2024-05-27 MED ORDER — INSULIN ASPART 100 UNIT/ML IJ SOLN
0.0000 [IU] | Freq: Three times a day (TID) | INTRAMUSCULAR | Status: DC
  Administered 2024-05-27: 3 [IU] via SUBCUTANEOUS
  Administered 2024-05-27: 2 [IU] via SUBCUTANEOUS
  Administered 2024-05-28: 3 [IU] via SUBCUTANEOUS
  Administered 2024-05-28: 7 [IU] via SUBCUTANEOUS
  Filled 2024-05-27 (×4): qty 1

## 2024-05-27 MED ORDER — INSULIN GLARGINE-YFGN 100 UNIT/ML ~~LOC~~ SOLN
25.0000 [IU] | SUBCUTANEOUS | Status: DC
  Administered 2024-05-27 – 2024-05-28 (×2): 25 [IU] via SUBCUTANEOUS
  Filled 2024-05-27 (×2): qty 0.25

## 2024-05-27 MED ORDER — QUETIAPINE FUMARATE 25 MG PO TABS
50.0000 mg | ORAL_TABLET | Freq: Every day | ORAL | Status: DC
  Administered 2024-05-27: 50 mg via ORAL
  Filled 2024-05-27: qty 2

## 2024-05-27 MED ORDER — ISOSORBIDE MONONITRATE ER 30 MG PO TB24
30.0000 mg | ORAL_TABLET | Freq: Two times a day (BID) | ORAL | Status: DC
  Administered 2024-05-27 – 2024-05-28 (×4): 30 mg via ORAL
  Filled 2024-05-27 (×4): qty 1

## 2024-05-27 MED ORDER — LEVOTHYROXINE SODIUM 50 MCG PO TABS
75.0000 ug | ORAL_TABLET | Freq: Every day | ORAL | Status: DC
  Administered 2024-05-27 – 2024-05-28 (×2): 75 ug via ORAL
  Filled 2024-05-27: qty 2
  Filled 2024-05-27 (×2): qty 1

## 2024-05-27 MED ORDER — CLONIDINE HCL 0.1 MG PO TABS: 0.1000 mg | ORAL_TABLET | Freq: Two times a day (BID) | ORAL | Status: DC | PRN

## 2024-05-27 MED ORDER — MAGNESIUM SULFATE 4 GM/100ML IV SOLN
4.0000 g | Freq: Once | INTRAVENOUS | Status: AC
  Administered 2024-05-27: 4 g via INTRAVENOUS
  Filled 2024-05-27: qty 100

## 2024-05-27 MED ORDER — APIXABAN 5 MG PO TABS
5.0000 mg | ORAL_TABLET | Freq: Two times a day (BID) | ORAL | Status: DC
  Administered 2024-05-27 – 2024-05-28 (×3): 5 mg via ORAL
  Filled 2024-05-27 (×3): qty 1

## 2024-05-27 MED ORDER — ATORVASTATIN CALCIUM 10 MG PO TABS
10.0000 mg | ORAL_TABLET | Freq: Every day | ORAL | Status: DC
  Administered 2024-05-27 – 2024-05-28 (×2): 10 mg via ORAL
  Filled 2024-05-27 (×2): qty 1

## 2024-05-27 MED ORDER — PANTOPRAZOLE SODIUM 40 MG PO TBEC
40.0000 mg | DELAYED_RELEASE_TABLET | Freq: Every day | ORAL | Status: DC
  Administered 2024-05-27 – 2024-05-28 (×2): 40 mg via ORAL
  Filled 2024-05-27 (×2): qty 1

## 2024-05-27 MED ORDER — SODIUM CHLORIDE 0.9 % IV SOLN: INTRAVENOUS | Status: DC

## 2024-05-27 MED ORDER — POTASSIUM CHLORIDE 10 MEQ/100ML IV SOLN
10.0000 meq | INTRAVENOUS | Status: AC
  Administered 2024-05-27 (×4): 10 meq via INTRAVENOUS
  Filled 2024-05-27 (×4): qty 100

## 2024-05-27 MED ORDER — INSULIN ASPART 100 UNIT/ML IJ SOLN
3.0000 [IU] | Freq: Three times a day (TID) | INTRAMUSCULAR | Status: DC
  Administered 2024-05-27 – 2024-05-28 (×4): 3 [IU] via SUBCUTANEOUS
  Filled 2024-05-27 (×4): qty 1

## 2024-05-27 MED ORDER — ALPRAZOLAM 0.5 MG PO TABS
0.5000 mg | ORAL_TABLET | Freq: Every evening | ORAL | Status: DC | PRN
  Administered 2024-05-27 (×2): 0.5 mg via ORAL
  Filled 2024-05-27 (×2): qty 1

## 2024-05-27 MED ORDER — POTASSIUM CHLORIDE CRYS ER 20 MEQ PO TBCR
40.0000 meq | EXTENDED_RELEASE_TABLET | Freq: Once | ORAL | Status: AC
  Administered 2024-05-27: 40 meq via ORAL
  Filled 2024-05-27: qty 2

## 2024-05-27 NOTE — Inpatient Diabetes Management (Signed)
 Inpatient Diabetes Program Recommendations  AACE/ADA: New Consensus Statement on Inpatient Glycemic Control (2015)  Target Ranges:  Prepandial:   less than 140 mg/dL      Peak postprandial:   less than 180 mg/dL (1-2 hours)      Critically ill patients:  140 - 180 mg/dL   Lab Results  Component Value Date   GLUCAP 240 (H) 05/27/2024   HGBA1C 7.4 (H) 04/04/2024    Review of Glycemic Control Diabetes history: DM2 Outpatient Diabetes medications: Lantus  20 units at bedtime, Jardiance  25 mg daily, Metformin 1000 mg BID Current orders for Inpatient glycemic control: Semglee  25 units at bedtime, Novolog  0-9 units TID with meals, Novolog  3 units tid with meals  Inpatient Diabetes Program Recommendations:   Agree with current insulin  regimen. Patient was just discharged from the hospital 04/23/24. Will follow while hospitalized.  Thank you, Terri Rorrer E. Dealva Lafoy, RN, MSN, CDCES  Diabetes Coordinator Inpatient Glycemic Control Team Team Pager [phone] (8am-5pm) 05/27/2024 1:36 PM

## 2024-05-27 NOTE — Progress Notes (Signed)
 Progress Note   Patient: Hailey Hawkins ZOX:[id number] DOB: 10-Feb-1946 DOA: 05/26/2024     0 DOS: the patient was seen and examined on 05/27/2024   Brief hospital course: Larina Modena Bellemare is a 78 y.o. 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  Apparently in the hospital, patient had elevated glucose at 663, normal anion gap.  Patient was placed on insulin  drip, glucose much better in the morning of 5/30, transition to subcu insulin .   Principal Problem:   Hyperosmolar hyperglycemic state (HHS) (HCC) Active Problems:   Uncontrolled type 2 diabetes mellitus with hyperglycemia, with long-term current use of insulin  (HCC)   UTI (urinary tract infection)   Essential hypertension   Paroxysmal atrial fibrillation (HCC)   AKI (acute kidney injury) (HCC)   HLD (hyperlipidemia)   Acquired hypothyroidism   Memory deficits   Depression with anxiety   Overweight (BMI 25.0-29.9)   Diarrhea   Assessment and Plan: Hyperosmolar hyperglycemic state (HHS):  Uncontrolled type 2 diabetes mellitus with hyperglycemia, with long-term current use of insulin  (HCC):  pt was found to have HHS with blood sugar 663, bicarbonate 21, anion gap 10, beta hydroxybutyric acid 1.61, ketone 5 in UA), VBG with pH 7.28.  Mental status is at baseline. Recent A1c 7.4, poorly controlled.  Patient supposed to take Januvia, metformin, Lantus  25 unit daily, but patient has not been doing this appropriately. Patient treated with IV insulin , glucose much better.  Changed to subcu insulin  today. Discussed with patient son, in the future, he will give the injection every night to the patient.   UTI (urinary tract infection) Continue Rocephin  for today, pending urine culture results.   Essential  hypertension: -Hydralazine  as needed - Clonidine , Imdur , propranolol    Paroxysmal atrial fibrillation (HCC) -Propranolol  - Eliquis    AKI (acute kidney injury) (HCC): Normal anion gap metabolic acidosis. Pseudohyponatremia. Hypomagnesemia. Renal function has normalized, still has mild hyponatremia.  Metabolic acidosis has resolved.  Replete magnesium  with level of 1.3.  Recheck level tomorrow.    HLD (hyperlipidemia) -Lipitor   Acquired hypothyroidism -Synthroid    Advanced dementia. Patient is already oriented to herself, discussed with the son, patient has quite advanced dementia.  She does not have any agitation.   Depression with anxiety -Continue home medications   Overweight (BMI 25.0-29.9): Body weight 81.6 kg, BMI 29.05 -Encourage to lose some weight -Has discussed healthy diet   Diarrhea No additional diarrhea since admission.   Discharge planning. Most likely will discharge home tomorrow with home care.  Family has made plans to take care of the patient at home.      Subjective:  Patient is very confused, but no agitation.  Denies any shortness of breath.  Physical Exam: Vitals:   05/27/24 0400 05/27/24 0700 05/27/24 0800 05/27/24 0900  BP: 122/83  130/82   Pulse: (!) 38 73 78 84  Resp: 19 18 18 16   Temp: 97.8 F (36.6 C)   (!) (P) 97.4 F (36.3 C)  TempSrc: Oral   (P) Oral  SpO2: 100% 99% 97% 99%  Weight:      Height:       General exam: Appears calm and comfortable  Respiratory system: Clear to auscultation. Respiratory effort normal. Cardiovascular system: S1 & S2 heard, RRR. No JVD, murmurs, rubs, gallops or clicks. No pedal edema. Gastrointestinal system:  Abdomen is nondistended, soft and nontender. No organomegaly or masses felt. Normal bowel sounds heard. Central nervous system: Alert and oriented x1. No focal neurological deficits. Extremities: Symmetric 5 x 5 power. Skin: No rashes, lesions or ulcers Psychiatry: Flat affect.   Data  Reviewed:  Lab results reviewed.  Family Communication: Son updated over the phone.  Disposition: Status is: Observation      Time spent: 50 minutes  Author: Donaciano Frizzle, MD 05/27/2024 11:16 AM  For on call review [email or web address].

## 2024-05-27 NOTE — Hospital Course (Addendum)
 Hailey Hawkins is a 78 y.o. 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  Apparently in the hospital, patient had elevated glucose at 663, normal anion gap.  Patient was placed on insulin  drip, glucose much better in the morning of 5/30, transition to subcu insulin . Patient was monitored after taking off of insulin  drip.  Condition had improved, medically stable for discharge.

## 2024-05-27 NOTE — Evaluation (Signed)
 Physical Therapy Evaluation Patient Details Name: Hailey Hawkins MRN: [id number] DOB: 04-Mar-1946 Today's Date: 05/27/2024  History of Present Illness  presented to ER secondary to weakness, diarrhea x4 days; admitted for management of hyperosmolar hyperglycemic state  Clinical Impression  Patient resting in bed upon arrival to room, eager for OOB activities.  Nurse present at bedside, redirecting to location and assisting with repositioning in bed.  Patient oriented to self only; unaware of location, date, time.  Does follow simple verbal commands; pleasant and cooperative throughout evaluation.  However, does require frequent redirection/reorientation to task (very easily distracted by external environment).  Denies pain.  Bilat UE/LE strength and ROM grossly symmetrical and WFL; no focal weakness appreciated.  Able to complete bed mobility with supervision; sit/stand, basic transfers and short-distance gait (5') with RW, cga.  Demonstrates partially reciprocal stepping pattern with fair step height/length; consistent cuing for direction and attention to task (easily distracted by external environment). Additional distance deferred due to breakfast arriving  Would benefit from skilled PT to address above deficits and promote optimal return to PLOF.; recommend post-acute PT follow up as indicated by interdisciplinary care team. Do recommend near-constant supervision due to deficits in cognition and overall safety awareness; if family unable to manage in home environment, may consider options to meet LTC needs.         If plan is discharge home, recommend the following: A little help with walking and/or transfers;A little help with bathing/dressing/bathroom;Assistance with cooking/housework;Direct supervision/assist for financial management;Assist for transportation;Direct supervision/assist for medications management;Help with stairs or ramp for entrance;Supervision due to cognitive status   Can  travel by private vehicle        Equipment Recommendations Rolling walker (2 wheels)  Recommendations for Other Services       Functional Status Assessment Patient has had a recent decline in their functional status and demonstrates the ability to make significant improvements in function in a reasonable and predictable amount of time.     Precautions / Restrictions Precautions Precautions: Fall Recall of Precautions/Restrictions: Impaired Restrictions Weight Bearing Restrictions Per Provider Order: No      Mobility  Bed Mobility Overal bed mobility: Needs Assistance Bed Mobility: Supine to Sit     Supine to sit: Supervision          Transfers Overall transfer level: Needs assistance Equipment used: Rolling walker (2 wheels) Transfers: Sit to/from Stand, Bed to chair/wheelchair/BSC Sit to Stand: Contact guard assist Stand pivot transfers: Contact guard assist              Ambulation/Gait Ambulation/Gait assistance: Contact guard assist Gait Distance (Feet): 5 Feet Assistive device: Rolling walker (2 wheels)         General Gait Details: partially reciprocal stepping pattern with fair step height/length; consistent cuing for direction and attention to task (easily distracted by external environment).  Additional distance deferred due to breakfast arriving  Stairs            Wheelchair Mobility     Tilt Bed    Modified Rankin (Stroke Patients Only)       Balance Overall balance assessment: Needs assistance Sitting-balance support: No upper extremity supported, Feet supported Sitting balance-Leahy Scale: Good     Standing balance support: Bilateral upper extremity supported Standing balance-Leahy Scale: Fair                               Pertinent Vitals/Pain Pain Assessment Pain Assessment: No/denies  pain    Home Living Family/patient expects to be discharged to:: Private residence Living Arrangements:  Spouse/significant other Available Help at Discharge: Family Type of Home: House Home Access: Stairs to enter Entrance Stairs-Rails: None Entrance Stairs-Number of Steps: 1 small step   Home Layout: One level Home Equipment: Agricultural consultant (2 wheels)      Prior Function Prior Level of Function : Independent/Modified Independent;Patient poor historian/Family not available             Mobility Comments: Sup/mod indep with intermittent use of RW per patient; does endorse fall history (unable to fully describe).  Will verify with family as available ADLs Comments: Pt endorses indep with ADL and IADL. No family present to verify and per chart     Extremity/Trunk Assessment   Upper Extremity Assessment Upper Extremity Assessment: Overall WFL for tasks assessed    Lower Extremity Assessment Lower Extremity Assessment: Overall WFL for tasks assessed (grossly at least 4/5 throughout; no focal weakness appreciated)       Communication   Communication Communication: No apparent difficulties    Cognition Arousal: Alert Behavior During Therapy: WFL for tasks assessed/performed   PT - Cognitive impairments: No family/caregiver present to determine baseline                       PT - Cognition Comments: Oriented to self only; easiily redirectable, but does not retain new information.  Follows commands, pleasant and cooperative; consistent cuing/supervision for safety         Cueing Cueing Techniques: Verbal cues, Tactile cues, Visual cues     General Comments      Exercises     Assessment/Plan    PT Assessment Patient needs continued PT services  PT Problem List Decreased activity tolerance;Decreased balance;Decreased cognition;Decreased knowledge of use of DME;Decreased safety awareness;Decreased knowledge of precautions;Decreased mobility       PT Treatment Interventions DME instruction;Gait training;Stair training;Functional mobility training;Therapeutic  exercise;Therapeutic activities;Balance training;Cognitive remediation;Patient/family education    PT Goals (Current goals can be found in the Care Plan section)  Acute Rehab PT Goals Patient Stated Goal: agreeable to session PT Goal Formulation: With patient Time For Goal Achievement: 06/10/24 Potential to Achieve Goals: Good    Frequency Min 2X/week     Co-evaluation               AM-PAC PT "6 Clicks" Mobility  Outcome Measure Help needed turning from your back to your side while in a flat bed without using bedrails?: None Help needed moving from lying on your back to sitting on the side of a flat bed without using bedrails?: None Help needed moving to and from a bed to a chair (including a wheelchair)?: A Little Help needed standing up from a chair using your arms (e.g., wheelchair or bedside chair)?: A Little Help needed to walk in hospital room?: A Little Help needed climbing 3-5 steps with a railing? : A Little 6 Click Score: 20    End of Session   Activity Tolerance: Patient tolerated treatment well Patient left: in chair;with call bell/phone within reach;with chair alarm set Nurse Communication: Mobility status PT Visit Diagnosis: Muscle weakness (generalized) (M62.81);Difficulty in walking, not elsewhere classified (R26.2)    Time: 1610-9604 PT Time Calculation (min) (ACUTE ONLY): 23 min   Charges:   PT Evaluation $PT Eval Moderate Complexity: 1 Mod   PT General Charges $$ ACUTE PT VISIT: 1 Visit        Berneita Sanagustin H.  Bevin Bucks, PT, DPT, NCS 05/27/24, 2:08 PM [phone]

## 2024-05-27 NOTE — Evaluation (Addendum)
 Occupational Therapy Evaluation Patient Details Name: Hailey Hawkins MRN: [id number] DOB: May 18, 1946 Today's Date: 05/27/2024   History of Present Illness   Hailey Hawkins is a 78 y.o. 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     Patient states that in the past 4 days, she has not been feeling well.  She has nausea, diarrhea, no vomiting or abdominal pain.  She has generalized weakness and lightheadedness.  No fever or chills.  She has 3-4 times of diarrhea each day.  No chest pain, cough, SOB.  She has polyuria and polydipsia, no dysuria or hematuria.  Per report, patient has not been taking her insulin  in the past several weeks.  Patient has memory loss. Her husband was supposed to be helping her with her insulin  injection but apparently he was not able to adequately do so.     Clinical Impressions Pt was seen for OT evaluation this date. Prior to hospital admission, pt reports she was able to complete ADLs independently. Pt lives at home with spouse and was using RW for ambulation. Pt presents to acute OT demonstrating impaired ADL performance and functional mobility 2/2 increased weakness (See OT problem list for additional functional deficits). Pt currently requires CGA to occasional Min A for LB dressing tasks, CGA for toileting tasks and toilet transfers.  Pt would benefit from skilled OT services to address noted impairments and functional limitations (see below for any additional details) in order to maximize safety and independence while minimizing falls risk and caregiver burden. At end of session, patient seated in recliner with BLEs elevated, call button within reach and needs met, chair alarm on.     If plan is discharge home, recommend the following:   A  little help with walking and/or transfers;A little help with bathing/dressing/bathroom;Assist for transportation;Assistance with cooking/housework;Direct supervision/assist for medications management;Supervision due to cognitive status     Functional Status Assessment   Patient has had a recent decline in their functional status and demonstrates the ability to make significant improvements in function in a reasonable and predictable amount of time.     Equipment Recommendations   Tub/shower seat     Recommendations for Other Services         Precautions/Restrictions   Precautions Precautions: Fall Recall of Precautions/Restrictions: Impaired Restrictions Weight Bearing Restrictions Per Provider Order: No     Mobility Bed Mobility                    Transfers Overall transfer level: Needs assistance Equipment used: Rolling walker (2 wheels) Transfers: Sit to/from Stand, Bed to chair/wheelchair/BSC Sit to Stand: Contact guard assist                  Balance Overall balance assessment: Needs assistance Sitting-balance support: Feet supported Sitting balance-Leahy Scale: Good     Standing balance support: During functional activity, Reliant on assistive device for balance Standing balance-Leahy Scale: Fair                             ADL either performed or assessed with clinical judgement   ADL Overall ADL's : Needs assistance/impaired Eating/Feeding: Independent   Grooming: Set up       Lower Body Bathing: Contact guard assist  Lower Body Dressing: Minimal assistance   Toilet Transfer: Contact guard assist   Toileting- Clothing Manipulation and Hygiene: Contact guard assist       Functional mobility during ADLs: Contact guard assist;Rolling walker (2 wheels) General ADL Comments: cuing needed moreso for safety awareness     Vision Baseline Vision/History: 0 No visual deficits       Perception          Praxis         Pertinent Vitals/Pain Pain Assessment Pain Assessment: No/denies pain     Extremity/Trunk Assessment Upper Extremity Assessment Upper Extremity Assessment: Generalized weakness           Communication Communication Communication: No apparent difficulties   Cognition Arousal: Alert Behavior During Therapy: WFL for tasks assessed/performed Cognition: Cognition impaired   Orientation impairments: Person Awareness: Intellectual awareness impaired Memory impairment (select all impairments): Short-term memory, Working memory Attention impairment (select first level of impairment): Sustained attention Executive functioning impairment (select all impairments): Initiation, Organization, Sequencing                           Cueing  General Comments   Cueing Techniques: Verbal cues;Tactile cues;Visual cues      Exercises Other Exercises Other Exercises: Education on safety awareness during ADL task engagement and fall risk management   Shoulder Instructions      Home Living Family/patient expects to be discharged to:: Private residence Living Arrangements: Spouse/significant other Available Help at Discharge: Family Type of Home: House Home Access: Stairs to enter (1 STE) Secretary/administrator of Steps: 1 small step Entrance Stairs-Rails: None Home Layout: One level     Bathroom Shower/Tub: Tub/shower unit;Walk-in shower (has option to use spouse's shower with small lip to step over)   Bathroom Toilet: Handicapped height     Home Equipment: Agricultural consultant (2 wheels)          Prior Functioning/Environment Prior Level of Function : Independent/Modified Independent;Patient poor historian/Family not available             Mobility Comments: recent falls last 3 weeks. reliant on RW use but typically has not needed any AD until recent admissions. ADLs Comments: Pt endorses indep with ADL and IADL. No family present to verify and per  chart    OT Problem List: Decreased strength;Decreased cognition;Decreased activity tolerance;Decreased safety awareness;Impaired balance (sitting and/or standing)   OT Treatment/Interventions: Self-care/ADL training;Therapeutic exercise;Therapeutic activities;DME and/or AE instruction;Patient/family education;Cognitive remediation/compensation      OT Goals(Current goals can be found in the care plan section)   Acute Rehab OT Goals Patient Stated Goal: Go home OT Goal Formulation: With patient Time For Goal Achievement: 06/10/24 Potential to Achieve Goals: Good ADL Goals Pt Will Perform Lower Body Dressing: with modified independence Pt Will Transfer to Toilet: with modified independence Pt Will Perform Toileting - Clothing Manipulation and hygiene: with modified independence   OT Frequency:  Min 2X/week    Co-evaluation              AM-PAC OT "6 Clicks" Daily Activity     Outcome Measure Help from another person eating meals?: None Help from another person taking care of personal grooming?: None Help from another person toileting, which includes using toliet, bedpan, or urinal?: A Little Help from another person bathing (including washing, rinsing, drying)?: A Little Help from another person to put on and taking off regular upper body clothing?: A Little Help from another person to put on and  taking off regular lower body clothing?: A Little 6 Click Score: 20   End of Session Equipment Utilized During Treatment: Rolling walker (2 wheels)  Activity Tolerance: Patient tolerated treatment well Patient left: in chair;with call bell/phone within reach;with chair alarm set  OT Visit Diagnosis: Unsteadiness on feet (R26.81);Muscle weakness (generalized) (M62.81)                Time: 1610-9604 OT Time Calculation (min): 49 min Charges:  OT General Charges $OT Visit: 1 Visit OT Evaluation $OT Eval Moderate Complexity: 1 Mod  Tongela Encinas OTR/L   Lucas Rushing 05/27/2024, 1:49 PM

## 2024-05-27 NOTE — Plan of Care (Signed)

## 2024-05-28 DIAGNOSIS — E1165 Type 2 diabetes mellitus with hyperglycemia: Secondary | ICD-10-CM | POA: Diagnosis not present

## 2024-05-28 DIAGNOSIS — E1100 Type 2 diabetes mellitus with hyperosmolarity without nonketotic hyperglycemic-hyperosmolar coma (NKHHC): Secondary | ICD-10-CM | POA: Diagnosis not present

## 2024-05-28 DIAGNOSIS — N3000 Acute cystitis without hematuria: Secondary | ICD-10-CM | POA: Diagnosis not present

## 2024-05-28 DIAGNOSIS — N179 Acute kidney failure, unspecified: Secondary | ICD-10-CM | POA: Diagnosis not present

## 2024-05-28 LAB — BASIC METABOLIC PANEL WITH GFR
Anion gap: 10 (ref 5–15)
BUN: 20 mg/dL (ref 8–23)
CO2: 20 mmol/L — ABNORMAL LOW (ref 22–32)
Calcium: 8.7 mg/dL — ABNORMAL LOW (ref 8.9–10.3)
Chloride: 105 mmol/L (ref 98–111)
Creatinine, Ser: 0.83 mg/dL (ref 0.44–1.00)
GFR, Estimated: >60 mL/min (ref >60)
Glucose, Bld: 214 mg/dL — ABNORMAL HIGH (ref 70–99)
Potassium: 4 mmol/L (ref 3.5–5.1)
Sodium: 135 mmol/L (ref 135–145)

## 2024-05-28 LAB — MAGNESIUM: Magnesium: 2 mg/dL (ref 1.7–2.4)

## 2024-05-28 LAB — GLUCOSE, CAPILLARY
Glucose-Capillary: 247 mg/dL — ABNORMAL HIGH (ref 70–99)
Glucose-Capillary: 303 mg/dL — ABNORMAL HIGH (ref 70–99)

## 2024-05-28 LAB — PHOSPHORUS: Phosphorus: 3.3 mg/dL (ref 2.5–4.6)

## 2024-05-28 MED ORDER — SODIUM BICARBONATE 650 MG PO TABS: 650.0000 mg | ORAL_TABLET | Freq: Two times a day (BID) | ORAL | 0 refills | Status: AC

## 2024-05-28 MED ORDER — FOSFOMYCIN TROMETHAMINE 3 G PO PACK
3.0000 g | PACK | Freq: Once | ORAL | Status: AC
  Administered 2024-05-28: 3 g via ORAL
  Filled 2024-05-28: qty 3

## 2024-05-28 NOTE — TOC Transition Note (Signed)
 Transition of Care [facility]) - Discharge Note   Patient Details  Name: Hailey Hawkins MRN: [id number] Date of Birth: [birth year]  Transition of Care [facility]) CM/SW Contact:  Alexandra Ice, RN Phone Number: 05/28/2024, 11:43 AM   Clinical Narrative:     Patient to discharge today, home with home health services. She is current with [facility]. Notified [place]  at [facility] patient to discharge today. No additional TOC needs identified, will continue to follow.    Barriers to Discharge: Barriers Resolved   Patient Goals and CMS Choice        Expected Discharge Plan and Services           Expected Discharge Date: 05/28/24                 DME Agency: NA       HH Arranged: PT, OT HH Agency: [facility] Date HH Agency Contacted: 05/28/24 Time HH Agency Contacted: 1143 Representative spoke with at [facility] Agency: [place]   Prior Living Arrangements/Services                       Activities of Daily Living   ADL Screening (condition at time of admission) Independently performs ADLs?: Yes (appropriate for developmental age) (before admission) Is the patient deaf or have difficulty hearing?: No Does the patient have difficulty seeing, even when wearing glasses/contacts?: No Does the patient have difficulty concentrating, remembering, or making decisions?: Yes  Permission Sought/Granted                  Emotional Assessment              Admission diagnosis:  Hyperosmolar hyperglycemic state (HHS) (HCC) [E11.00] Patient Active Problem List   Diagnosis Date Noted   Hyperosmolar hyperglycemic state (HHS) (HCC) 05/26/2024   HLD (hyperlipidemia) 05/26/2024   Depression with anxiety 05/26/2024   Overweight (BMI 25.0-29.9) 05/26/2024   Diarrhea 05/26/2024   Memory deficits 04/18/2024   AKI (acute kidney injury) (HCC) 04/18/2024   Nocturnal hypoxia 04/18/2024   UTI (urinary tract infection) 04/08/2024   Pre-syncope 04/04/2024    Primary osteoarthritis involving multiple joints 09/23/2023   B12 deficiency 07/07/2023   Paroxysmal atrial fibrillation (HCC) 06/24/2023   Weight loss 03/03/2021   Elevated LFTs 03/03/2021   Nodule of upper lobe of right lung 05/06/2019   Nose dryness 03/21/2019   Abnormal CT of the chest 12/07/2018   Chronic fatigue 12/07/2018   Exertional dyspnea 12/07/2018   PR3 ANCA antibodies present 12/07/2018   Coronary artery disease involving native coronary artery of native heart 11/16/2018   Uncontrolled type 2 diabetes mellitus with hyperglycemia, with long-term current use of insulin  (HCC) 02/18/2018   Iron deficiency anemia 01/14/2018   Hx of adenomatous colonic polyps 03/26/2016   Mild aortic valve stenosis 02/07/2016   Morbid obesity with BMI of 40.0-44.9, adult (HCC) 01/11/2016   SIADH (syndrome of inappropriate ADH production) (HCC) 10/17/2015   Chronic hyponatremia 07/05/2015   Acquired hypothyroidism 03/06/2014   Anemia, unspecified 03/06/2014   Essential hypertension 03/06/2014   Nontoxic uninodular goiter 03/06/2014   Osteoarthrosis involving lower leg 03/06/2014   Pernicious anemia 03/06/2014   Pure hypercholesterolemia 03/06/2014   Type 2 diabetes mellitus without complication, with long-term current use of insulin  (HCC) 03/06/2014   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   PCP:  Yehuda Helms, MD Pharmacy:   [facility] [phone] - [place], [place] - [address] AT  [address] [place] [address] [place] [address] Phone: [phone] Fax: [phone]  [facility] [address] [place] [address] Phone: [phone] Fax: [phone]     Social Determinants of Health (SDOH) Interventions    Readmission Risk Interventions     No data to display           Final next level of care: Home w Home Health Services Barriers to Discharge: Barriers  Resolved   Patient Goals and CMS Choice            Discharge Placement                  Name of family member notified: Aron Lard Patient and family notified of of transfer: 05/28/24  Discharge Plan and Services Additional resources added to the After Visit Summary for                    DME Agency: NA       HH Arranged: PT, OT HH Agency: [facility] Date [facility] Agency Contacted: 05/28/24 Time HH Agency Contacted: 1143 Representative spoke with at [facility] Agency: [place]   Social Drivers of Health (SDOH) Interventions SDOH Screenings   Food Insecurity: No Food Insecurity (05/27/2024)  Housing: Low Risk  (05/27/2024)  Transportation Needs: No Transportation Needs (05/27/2024)  Utilities: Not At Risk (05/27/2024)  Financial Resource Strain: Low Risk  (08/28/2023)   Received from [facility]  Social Connections: Patient Declined (05/27/2024)  Tobacco Use: Low Risk  (05/26/2024)     Readmission Risk Interventions     No data to display

## 2024-05-28 NOTE — Care Management Obs Status (Signed)
 MEDICARE OBSERVATION STATUS NOTIFICATION   Patient Details  Name: Lashawnta Burgert MRN: [id number] Date of Birth: [birth year]   Medicare Observation Status Notification Given:  Yes    Anise Kerns 05/28/2024, 9:57 AM

## 2024-05-28 NOTE — Progress Notes (Signed)
 Reviewed discharge note with patient's husband. Patient's husband acknowledged understanding. Patient discharged with personal belongings. Patient wheeled out by staff. No distress noted in patient. Patient transported home via family vehicle.

## 2024-05-28 NOTE — Discharge Instructions (Signed)
 [facility].  [address] , [place], [place], [address]. [phone] They will call you to arrange when they can come to see you

## 2024-05-28 NOTE — Discharge Summary (Signed)
 Physician Discharge Summary   Patient: Hailey Hawkins MRN: [id number] DOB: 05-Jan-1946  Admit date:     05/26/2024  Discharge date: 05/28/24  Discharge Physician: Donaciano Frizzle   PCP: Yehuda Helms, MD   Recommendations at discharge:   Follow-up with PCP in 1 week.  Discharge Diagnoses: Principal Problem:   Hyperosmolar hyperglycemic state (HHS) (HCC) Active Problems:   Uncontrolled type 2 diabetes mellitus with hyperglycemia, with long-term current use of insulin  (HCC)   UTI (urinary tract infection)   Essential hypertension   Paroxysmal atrial fibrillation (HCC)   AKI (acute kidney injury) (HCC)   HLD (hyperlipidemia)   Acquired hypothyroidism   Memory deficits   Depression with anxiety   Overweight (BMI 25.0-29.9)   Diarrhea  Resolved Problems:   * No resolved hospital problems. *  Hospital Course: Hailey Hawkins is a 78 y.o. 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  Apparently in the hospital, patient had elevated glucose at 663, normal anion gap.  Patient was placed on insulin  drip, glucose much better in the morning of 5/30, transition to subcu insulin . Patient was monitored after taking off of insulin  drip.  Condition had improved, medically stable for discharge.  Assessment and Plan: Hyperosmolar hyperglycemic state (HHS):  Uncontrolled type 2 diabetes mellitus with hyperglycemia, with long-term current use of insulin  [facility]):  pt was found to have HHS with blood sugar 663, bicarbonate 21, anion gap 10, beta hydroxybutyric acid 1.61, ketone 5 in UA), VBG with pH 7.28.  Mental status is at baseline. Recent A1c 7.4, poorly controlled.  Patient supposed to take Januvia, metformin, Lantus  25 unit daily, but patient has not been doing this  appropriately. Patient treated with IV insulin , glucose much better.  Changed to subcu insulin  today. Discussed with patient son, in the future, he will give the injection every night to the patient. Patient condition has improved, medically stable for discharge.   UTI (urinary tract infection) secondary to E. coli. Patient does not have sepsis, has received 2 days of Rocephin , will give her dose of fosfomycin to complete course.   Essential hypertension: Resume home treatment.   Paroxysmal atrial fibrillation (HCC) Resumed home treatment.   AKI (acute kidney injury) (HCC): Normal anion gap metabolic acidosis. Pseudohyponatremia. Hypomagnesemia. Electrolytes normalized, still has mild metabolic acidosis, will give 5 days of sodium bicarb.     HLD (hyperlipidemia) -Lipitor   Acquired hypothyroidism -Synthroid    Advanced dementia. Patient is already oriented to herself, discussed with the son, patient has quite advanced dementia.  She does not have any agitation.   Depression with anxiety -Continue home medications   Overweight (BMI 25.0-29.9): Body weight 81.6 kg, BMI 29.05 -Encourage to lose some weight -Has discussed healthy diet   Diarrhea No additional diarrhea since admission.  Pressure Ulcer POA.  Follow-up with PCP as outpatient. Pressure Injury 05/26/24 Sacrum Medial Stage 1 -  Intact skin with non-blanchable redness of a localized area usually over a bony prominence. Redness (Active)  05/26/24 2330  Location: Sacrum  Location Orientation: Medial  Staging: Stage 1 -  Intact skin with non-blanchable redness of a localized area usually over a bony prominence.  Wound Description (Comments): Redness  Present on Admission: Yes         Consultants: None Procedures performed: None  Disposition: Home health Diet recommendation:  Discharge Diet Orders (From admission, onward)     Start     Ordered   05/28/24 0000  Diet Carb Modified        05/28/24 0944            Carb modified diet DISCHARGE MEDICATION: Allergies as of 05/28/2024       Reactions   Methamphetamine Shortness Of Breath   Difficulty breathing (INHALER)   Amoxicillin    Other reaction(s): Diarrhea and vomiting (finding)   Antihistamines, Chlorpheniramine-type    Other reaction(s): Other (See Comments) Severe dryness   Ciprofloxacin    Other reaction(s): Distress (finding)   Codeine    Other reaction(s): Diarrhea and vomiting (finding), Weal or any derivitive   Other Other (See Comments)   Birth control pills - blood clots Birth control pills - blood clots   Oxycodone-acetaminophen  Nausea And Vomiting   Penicillin G    Other reaction(s): Weal   Meloxicam Palpitations        Medication List     TAKE these medications    acetaminophen  500 MG tablet Commonly known as: TYLENOL  Take 1,000 mg by mouth every 6 (six) hours as needed for mild pain.   albuterol  108 (90 Base) MCG/ACT inhaler Commonly known as: VENTOLIN  HFA Inhale 2 puffs into the lungs every 4 (four) hours as needed for wheezing or shortness of breath.   ALPRAZolam  0.5 MG tablet Commonly known as: XANAX  Take 0.5 mg by mouth at bedtime as needed for sleep.   apixaban  5 MG Tabs tablet Commonly known as: ELIQUIS  Take 5 mg by mouth 2 (two) times daily.   atorvastatin  10 MG tablet Commonly known as: LIPITOR Take 10 mg by mouth daily.   cloNIDine  0.2 MG tablet Commonly known as: CATAPRES  Take 0.5 tablets (0.1 mg total) by mouth 2 (two) times daily as needed (SBP >160 despite propranolol ).   desvenlafaxine 25 MG 24 hr tablet Commonly known as: PRISTIQ Take 25 mg by mouth daily.   empagliflozin  25 MG Tabs tablet Commonly known as: JARDIANCE  Take 25 mg by mouth daily.   FREESTYLE LITE test strip Generic drug: glucose blood Use 2 (two) times daily   hydrALAZINE  50 MG tablet Commonly known as: APRESOLINE  Take 50 mg by mouth.   insulin  glargine 100 UNIT/ML injection Commonly known  as: LANTUS  Inject 0.25 mLs (25 Units total) into the skin at bedtime.   INSULIN  SYRINGE .5CC/31GX5/16" 31G X 5/16" 0.5 ML Misc Use 1 syringe daily for insulin  injections   isosorbide  mononitrate 30 MG 24 hr tablet Commonly known as: IMDUR  Take 30 mg by mouth 2 (two) times daily.   levothyroxine  75 MCG tablet Commonly known as: SYNTHROID  Take 75 mcg by mouth daily before breakfast.   metFORMIN 1000 MG tablet Commonly known as: GLUCOPHAGE Take 1,000 mg by mouth 2 (two) times daily with a meal.   NovoFine 30G X 8 MM Misc Generic drug: Insulin  Pen Needle as directed To inject insulin .   omeprazole 20 MG capsule Commonly known as: PRILOSEC Take 20 mg by mouth 2 (two) times daily before a meal.   OXYGEN Inhale 2 L into the lungs at bedtime.   propranolol  20 MG tablet Commonly known as: INDERAL  Take 1 tablet by mouth 2 (two) times daily.   psyllium 95 % Pack Commonly known as: HYDROCIL/METAMUCIL Take 1 packet by mouth daily.   QUEtiapine  50 MG tablet Commonly known as: SEROQUEL  Take 50 mg by mouth at bedtime.   sodium bicarbonate 650 MG tablet Take 1  tablet (650 mg total) by mouth 2 (two) times daily for 7 days.               Discharge Care Instructions  (From admission, onward)           Start     Ordered   05/28/24 0000  Discharge wound care:       Comments: Follow with pcp   05/28/24 0944            Follow-up Information     Yehuda Helms, MD Follow up in 1 week(s).   Specialty: Internal Medicine Contact information: [address] [place] [address] [phone]                Discharge Exam: Cleavon Curls Weights   05/26/24 2051 05/26/24 2140 05/26/24 2339  Weight: 77.2 kg 81.6 kg 81.6 kg  General exam: Appears calm and comfortable  Respiratory system: Clear to auscultation. Respiratory effort normal. Cardiovascular system: S1 & S2 heard, RRR. No JVD, murmurs, rubs, gallops or clicks. No pedal  edema. Gastrointestinal system: Abdomen is nondistended, soft and nontender. No organomegaly or masses felt. Normal bowel sounds heard. Central nervous system: Alert and oriented x1. No focal neurological deficits. Extremities: Symmetric 5 x 5 power. Skin: No rashes, lesions or ulcers Psychiatry: Flat affect.   Condition at discharge: fair  The results of significant diagnostics from this hospitalization (including imaging, microbiology, ancillary and laboratory) are listed below for reference.   Imaging Studies: No results found.  Microbiology: Results for orders placed or performed during the hospital encounter of 05/26/24  Urine Culture (for pregnant, neutropenic or urologic patients or patients with an indwelling urinary catheter)     Status: Abnormal (Preliminary result)   Collection Time: 05/26/24  8:12 PM   Specimen: Urine, Clean Catch  Result Value Ref Range Status   Specimen Description   Final    URINE, CLEAN CATCH Performed at [facility], [address], [place], [place] [address]    Special Requests   Final    NONE Performed at [facility], [address] [place], [place] [address]    Culture (A)  Final    40,000 COLONIES/mL ESCHERICHIA COLI CULTURE REINCUBATED FOR BETTER GROWTH SUSCEPTIBILITIES TO FOLLOW Performed at [facility], [address], [place], [place] [address]    Report Status PENDING  Incomplete  MRSA Next Gen by PCR, Nasal     Status: None   Collection Time: 05/26/24 11:38 PM   Specimen: Nasal Mucosa; Nasal Swab  Result Value Ref Range Status   MRSA by PCR Next Gen NOT DETECTED NOT DETECTED Final    Comment: (NOTE) The GeneXpert MRSA Assay (FDA approved for NASAL specimens only), is one component of a comprehensive MRSA colonization surveillance program. It is not intended to diagnose MRSA infection nor to guide or monitor treatment for MRSA infections. Test performance is not FDA approved in patients less than 26  years old. Performed at [facility], [address] [place], [place] [address]     Labs: CBC: Recent Labs  Lab 05/26/24 1603  WBC 8.6  HGB 13.5  HCT 40.0  MCV 85.1  PLT 221   Basic Metabolic Panel: Recent Labs  Lab 05/26/24 2139 05/27/24 0207 05/27/24 0401 05/27/24 0909 05/28/24 0539  NA 130* 136 134* 134* 135  K 3.4* 4.0 3.0* 3.8 4.0  CL 101 105 106 103 105  CO2 19* 20* 17* 22 20*  GLUCOSE  464* 132* 154* 126* 214*  BUN 37* 32* 30* 25* 20  CREATININE 1.25* 1.04* 0.95 0.86 0.83  CALCIUM  8.7* 9.1 8.7* 8.9 8.7*  MG  --   --  1.3*  --  2.0  PHOS  --   --   --   --  3.3   Liver Function Tests: No results for input(s): "AST", "ALT", "ALKPHOS", "BILITOT", "PROT", "ALBUMIN" in the last 168 hours. CBG: Recent Labs  Lab 05/27/24 1053 05/27/24 1147 05/27/24 1826 05/27/24 2137 05/28/24 0729  GLUCAP 276* 240* 185* 155* 247*    Discharge time spent: greater than 30 minutes.  Signed: Donaciano Frizzle, MD [facility] 05/28/2024

## 2024-05-28 NOTE — Plan of Care (Signed)
   Problem: Education: Goal: Knowledge of General Education information will improve Description Including pain rating scale, medication(s)/side effects and non-pharmacologic comfort measures Outcome: Progressing   Problem: Activity: Goal: Risk for activity intolerance will decrease Outcome: Progressing   Problem: Safety: Goal: Ability to remain free from injury will improve Outcome: Progressing

## 2024-05-29 LAB — URINE CULTURE: Culture: 40000 — AB

## 2024-05-30 ENCOUNTER — Other Ambulatory Visit: Payer: Self-pay

## 2024-05-30 ENCOUNTER — Emergency Department
Admission: EM | Admit: 2024-05-30 | Discharge: 2024-05-30 | Disposition: A | Discharge status: Home / Self Care | Attending: Emergency Medicine | Admitting: Emergency Medicine

## 2024-05-30 ENCOUNTER — Emergency Department

## 2024-05-30 DIAGNOSIS — J45909 Unspecified asthma, uncomplicated: Secondary | ICD-10-CM | POA: Diagnosis not present

## 2024-05-30 DIAGNOSIS — E039 Hypothyroidism, unspecified: Secondary | ICD-10-CM | POA: Insufficient documentation

## 2024-05-30 DIAGNOSIS — I10 Essential (primary) hypertension: Secondary | ICD-10-CM | POA: Diagnosis not present

## 2024-05-30 DIAGNOSIS — W19XXXA Unspecified fall, initial encounter: Secondary | ICD-10-CM

## 2024-05-30 DIAGNOSIS — S0003XA Contusion of scalp, initial encounter: Secondary | ICD-10-CM

## 2024-05-30 DIAGNOSIS — Z23 Encounter for immunization: Secondary | ICD-10-CM | POA: Diagnosis not present

## 2024-05-30 DIAGNOSIS — S0990XA Unspecified injury of head, initial encounter: Secondary | ICD-10-CM | POA: Diagnosis present

## 2024-05-30 DIAGNOSIS — E119 Type 2 diabetes mellitus without complications: Secondary | ICD-10-CM | POA: Diagnosis not present

## 2024-05-30 DIAGNOSIS — W1830XA Fall on same level, unspecified, initial encounter: Secondary | ICD-10-CM | POA: Diagnosis not present

## 2024-05-30 MED ORDER — TETANUS-DIPHTH-ACELL PERTUSSIS 5-2.5-18.5 LF-MCG/0.5 IM SUSY
0.5000 mL | PREFILLED_SYRINGE | Freq: Once | INTRAMUSCULAR | Status: AC
  Administered 2024-05-30: 0.5 mL via INTRAMUSCULAR
  Filled 2024-05-30: qty 0.5

## 2024-05-30 MED ORDER — ONDANSETRON 8 MG PO TBDP: 8.0000 mg | ORAL_TABLET | Freq: Three times a day (TID) | ORAL | 0 refills | Status: AC | PRN

## 2024-05-30 MED ORDER — ONDANSETRON HCL 4 MG/2ML IJ SOLN
4.0000 mg | Freq: Once | INTRAMUSCULAR | Status: AC
  Administered 2024-05-30: 4 mg via INTRAVENOUS
  Filled 2024-05-30: qty 2

## 2024-05-30 NOTE — ED Notes (Signed)
 PT's daughter and husband to bedside.

## 2024-05-30 NOTE — ED Notes (Signed)
 Pt to CT

## 2024-05-30 NOTE — ED Triage Notes (Addendum)
 Pt to Ed from home via [facility] for a fall. Pt fell in her kitchen and hit the back of her head on a wooden stool. Pt denies LOC and has a lac on back of head. Pt denies taking blood thinners.   Family told EMS pt has fallen several times over the past week due to not using her walker.   Pt has a hx of hypertension and afib.   EMS vitals: Bp: 170/100   ED provider at bedside.

## 2024-05-30 NOTE — ED Notes (Signed)
 Fall precautions in place for Pt. This RN placed fall band, fall grip socks, bed alarm and fall sign.

## 2024-05-30 NOTE — ED Provider Notes (Signed)
 [facility] Provider Note   Event Date/Time   First MD Initiated Contact with Patient 05/30/24 [phone]     (approximate) History  Fall  HPI Hailey Hawkins is a 78 y.o. female with a past medical history of type 2 diabetes, hypothyroidism, SIADH, Hashimoto's disease, hypertension, and asthma who presents after mechanical fall from standing via EMS.  Patient has a hematoma with a small amount of bleeding to the left posterior parietal scalp.  Patient denies loss of consciousness.  Patient is not on any blood thinners.  Patient does complain of nausea without vomiting ROS: Patient currently denies any vision changes, tinnitus, difficulty speaking, facial droop, sore throat, chest pain, shortness of breath, abdominal pain, vomiting/diarrhea, dysuria, or weakness/numbness/paresthesias in any extremity   Physical Exam  Triage Vital Signs: ED Triage Vitals  Encounter Vitals Group     BP      Systolic BP Percentile      Diastolic BP Percentile      Pulse      Resp      Temp      Temp src      SpO2      Weight      Height      Head Circumference      Peak Flow      Pain Score      Pain Loc      Pain Education      Exclude from Growth Chart    Most recent vital signs: Vitals:   05/30/24 0600 05/30/24 0610  BP: 120/80   Pulse: 83   Resp: 20 20  Temp:    SpO2: 93%    General: Awake, oriented x4. CV:  Good peripheral perfusion. Resp:  Normal effort. Abd:  No distention. Other:  Elderly overweight Caucasian female resting comfortably in no acute distress.  Large hematoma with punctate area of bleeding in the left posterior parietal scalp ED Results / Procedures / Treatments  Labs (all labs ordered are listed, but only abnormal results are displayed) Labs Reviewed - No data to display EKG ED ECG REPORT I, Charleen Conn, the attending physician, personally viewed and interpreted this ECG. Date: 05/30/2024 EKG Time: 0522 Rate: 73 Rhythm: normal sinus  rhythm QRS Axis: normal Intervals: normal ST/T Wave abnormalities: normal Narrative Interpretation: no evidence of acute ischemia RADIOLOGY ED MD interpretation: One-view portable chest x-ray interpreted by me shows no evidence of acute abnormalities including no pneumonia, pneumothorax, or widened mediastinum CT of the head without contrast interpreted by me shows no evidence of acute abnormalities including no intracerebral hemorrhage, obvious masses, or significant edema.  There is a small left posterior parietal scalp hematoma CT of the cervical spine interpreted by me does not show any evidence of acute abnormalities including no acute fracture, malalignment, height loss, or dislocation - All radiology independently interpreted and agree with radiology assessment Official radiology report(s): DG Chest Port 1 View Result Date: 05/30/2024 CLINICAL DATA:  Left-sided chest wall pain. EXAM: PORTABLE CHEST 1 VIEW COMPARISON:  04/04/2024 FINDINGS: Heart size and mediastinal contours appear normal. Aortic atherosclerotic calcifications. There is no pleural fluid, interstitial edema or airspace disease. Slight asymmetric elevation of the right hemidiaphragm, unchanged. Visualized osseous structures are unremarkable. IMPRESSION: No acute cardiopulmonary disease. Aortic Atherosclerosis (ICD10-I70.0). Electronically Signed   By: Kimberley Penman M.D.   On: 05/30/2024 05:49   CT Head Wo Contrast Result Date: 05/30/2024 CLINICAL DATA:  Neck trauma.  Status post fall. EXAM: CT HEAD  WITHOUT CONTRAST CT CERVICAL SPINE WITHOUT CONTRAST TECHNIQUE: Multidetector CT imaging of the head and cervical spine was performed following the standard protocol without intravenous contrast. Multiplanar CT image reconstructions of the cervical spine were also generated. RADIATION DOSE REDUCTION: This exam was performed according to the departmental dose-optimization program which includes automated exposure control, adjustment of the  mA and/or kV according to patient size and/or use of iterative reconstruction technique. COMPARISON:  04/18/2024 FINDINGS: CT HEAD FINDINGS Brain: No evidence of acute infarction, hemorrhage, hydrocephalus, extra-axial collection or mass lesion/mass effect. There is mild diffuse low-attenuation within the subcortical and periventricular white matter compatible with chronic microvascular disease. Vascular: No hyperdense vessel or unexpected calcification. Skull: Normal. Negative for fracture or focal lesion. Sinuses/Orbits: No acute finding. Other: Small left posterior parietal scalp hematoma, image 17/3. There is also mild edema overlying the right posterior parietal convexity. CT CERVICAL SPINE FINDINGS Alignment: Normal. Skull base and vertebrae: No acute fracture. No primary bone lesion or focal pathologic process. Soft tissues and spinal canal: No prevertebral fluid or swelling. No visible canal hematoma. Disc levels: Multi level disc space narrowing and endplate spurring identified throughout the cervical spine. This is most advanced at the C5-6, C6-7 and C7-T1 levels. Upper chest: Negative. Other: None IMPRESSION: 1. No acute intracranial abnormalities. 2. Chronic microvascular disease. 3. Small left posterior parietal scalp hematoma. 4. No evidence for cervical spine fracture or subluxation. 5. Cervical degenerative disc disease. Electronically Signed   By: Kimberley Penman M.D.   On: 05/30/2024 05:45   CT Cervical Spine Wo Contrast Result Date: 05/30/2024 CLINICAL DATA:  Neck trauma.  Status post fall. EXAM: CT HEAD WITHOUT CONTRAST CT CERVICAL SPINE WITHOUT CONTRAST TECHNIQUE: Multidetector CT imaging of the head and cervical spine was performed following the standard protocol without intravenous contrast. Multiplanar CT image reconstructions of the cervical spine were also generated. RADIATION DOSE REDUCTION: This exam was performed according to the departmental dose-optimization program which includes  automated exposure control, adjustment of the mA and/or kV according to patient size and/or use of iterative reconstruction technique. COMPARISON:  04/18/2024 FINDINGS: CT HEAD FINDINGS Brain: No evidence of acute infarction, hemorrhage, hydrocephalus, extra-axial collection or mass lesion/mass effect. There is mild diffuse low-attenuation within the subcortical and periventricular white matter compatible with chronic microvascular disease. Vascular: No hyperdense vessel or unexpected calcification. Skull: Normal. Negative for fracture or focal lesion. Sinuses/Orbits: No acute finding. Other: Small left posterior parietal scalp hematoma, image 17/3. There is also mild edema overlying the right posterior parietal convexity. CT CERVICAL SPINE FINDINGS Alignment: Normal. Skull base and vertebrae: No acute fracture. No primary bone lesion or focal pathologic process. Soft tissues and spinal canal: No prevertebral fluid or swelling. No visible canal hematoma. Disc levels: Multi level disc space narrowing and endplate spurring identified throughout the cervical spine. This is most advanced at the C5-6, C6-7 and C7-T1 levels. Upper chest: Negative. Other: None IMPRESSION: 1. No acute intracranial abnormalities. 2. Chronic microvascular disease. 3. Small left posterior parietal scalp hematoma. 4. No evidence for cervical spine fracture or subluxation. 5. Cervical degenerative disc disease. Electronically Signed   By: Kimberley Penman M.D.   On: 05/30/2024 05:45   PROCEDURES: Critical Care performed: No Procedures MEDICATIONS ORDERED IN ED: Medications  ondansetron  (ZOFRAN ) injection 4 mg (4 mg Intravenous Given 05/30/24 0459)  Tdap (BOOSTRIX) injection 0.5 mL (0.5 mLs Intramuscular Given 05/30/24 0616)   IMPRESSION / MDM / ASSESSMENT AND PLAN / ED COURSE  I reviewed the triage vital signs and the  nursing notes.                             The patient is on the cardiac monitor to evaluate for evidence of arrhythmia  and/or significant heart rate changes. Patient's presentation is most consistent with acute presentation with potential threat to life or bodily function. Presenting after a fall that occurred just prior to arrival, resulting in injury to the left posterior parietal scalp. The mechanism of injury was a mechanical ground level fall without syncope or near-syncope. The current level of pain is moderate. There was no loss of consciousness, confusion, seizure, or memory impairment. There is a laceration associated with the injury however there is punctate bleeding with no obvious open laceration Denies neck pain. The patient does not take blood thinner medications. Denies vomiting, numbness/weakness, fever  Dispo: Discharge with PCP follow-up       FINAL CLINICAL IMPRESSION(S) / ED DIAGNOSES   Final diagnoses:  Fall, initial encounter  Contusion of scalp, initial encounter   Rx / DC Orders   ED Discharge Orders          Ordered    ondansetron  (ZOFRAN -ODT) 8 MG disintegrating tablet  Every 8 hours PRN        05/30/24 0612           Note:  This document was prepared using Dragon voice recognition software and may include unintentional dictation errors.   Kaleea Penner K, MD 05/30/24 [phone]

## 2024-07-23 ENCOUNTER — Emergency Department
Admission: EM | Admit: 2024-07-23 | Discharge: 2024-07-23 | Disposition: A | Discharge status: Home / Self Care | Attending: Emergency Medicine | Admitting: Emergency Medicine

## 2024-07-23 ENCOUNTER — Other Ambulatory Visit: Payer: Self-pay

## 2024-07-23 DIAGNOSIS — I10 Essential (primary) hypertension: Secondary | ICD-10-CM | POA: Insufficient documentation

## 2024-07-23 DIAGNOSIS — E162 Hypoglycemia, unspecified: Secondary | ICD-10-CM | POA: Diagnosis present

## 2024-07-23 DIAGNOSIS — E11649 Type 2 diabetes mellitus with hypoglycemia without coma: Secondary | ICD-10-CM | POA: Diagnosis not present

## 2024-07-23 LAB — CBG MONITORING, ED
Glucose-Capillary: 80 mg/dL (ref 70–99)
Glucose-Capillary: 116 mg/dL — ABNORMAL HIGH (ref 70–99)

## 2024-07-23 LAB — URINALYSIS, ROUTINE W REFLEX MICROSCOPIC
Bilirubin Urine: NEGATIVE
Glucose, UA: 150 mg/dL — AB
Hgb urine dipstick: NEGATIVE
Ketones, ur: 5 mg/dL — AB
Nitrite: NEGATIVE
Protein, ur: NEGATIVE mg/dL
Specific Gravity, Urine: 1.005 (ref 1.005–1.030)
Squamous Epithelial / HPF: 0 /HPF (ref 0–5)
pH: 7 (ref 5.0–8.0)

## 2024-07-23 LAB — CBC
HCT: 40.6 % (ref 36.0–46.0)
Hemoglobin: 13.1 g/dL (ref 12.0–15.0)
MCH: 29.1 pg (ref 26.0–34.0)
MCHC: 32.3 g/dL (ref 30.0–36.0)
MCV: 90.2 fL (ref 80.0–100.0)
Platelets: 233 K/uL (ref 150–400)
RBC: 4.5 MIL/uL (ref 3.87–5.11)
RDW: 12.4 % (ref 11.5–15.5)
WBC: 5.8 K/uL (ref 4.0–10.5)
nRBC: 0 % (ref 0.0–0.2)

## 2024-07-23 LAB — COMPREHENSIVE METABOLIC PANEL WITH GFR
ALT: 19 U/L (ref 0–44)
AST: 25 U/L (ref 15–41)
Albumin: 3.8 g/dL (ref 3.5–5.0)
Alkaline Phosphatase: 57 U/L (ref 38–126)
Anion gap: 9 (ref 5–15)
BUN: 15 mg/dL (ref 8–23)
CO2: 27 mmol/L (ref 22–32)
Calcium: 9.3 mg/dL (ref 8.9–10.3)
Chloride: 105 mmol/L (ref 98–111)
Creatinine, Ser: <0.3 mg/dL — ABNORMAL LOW (ref 0.44–1.00)
Glucose, Bld: 131 mg/dL — ABNORMAL HIGH (ref 70–99)
Potassium: 3.9 mmol/L (ref 3.5–5.1)
Sodium: 141 mmol/L (ref 135–145)
Total Bilirubin: 1 mg/dL (ref 0.0–1.2)
Total Protein: 7.4 g/dL (ref 6.5–8.1)

## 2024-07-23 LAB — TROPONIN I (HIGH SENSITIVITY): Troponin I (High Sensitivity): 8 ng/L (ref <18)

## 2024-07-23 MED ORDER — DEXTROSE 50 % IV SOLN: 25.0000 mL | Freq: Once | INTRAVENOUS | Status: AC

## 2024-07-23 MED ORDER — DEXTROSE 50 % IV SOLN
INTRAVENOUS | Status: AC
  Administered 2024-07-23: 25 mL via INTRAVENOUS
  Filled 2024-07-23: qty 50

## 2024-07-23 NOTE — ED Notes (Signed)
 Pt assisted with bed pan

## 2024-07-23 NOTE — ED Notes (Signed)
 Attempted to call patient's daughter-in-law, Glynna Failla, regarding patient's discharge and to arrange pick-up. No answer; unable to leave voicemail d/t voicemail not being set up.

## 2024-07-23 NOTE — ED Triage Notes (Signed)
 Pt brought in from home by EMS for hypoglycemia. Initial CBG 52 PTA; 250mL D5 administered by EMS. Upon arrival to ED, CBG 80. Pt alert & oriented x 4.

## 2024-07-23 NOTE — ED Notes (Signed)
 Attempted to call patient's husband, Lamyia Cdebaca, to pick up patient. No answer, no voicemail left d/t voicemail not being set up.

## 2024-07-23 NOTE — ED Provider Notes (Signed)
 [facility] Provider Note    Event Date/Time   First MD Initiated Contact with Patient 07/23/24 1042     (approximate)   History   Hypoglycemia   HPI  Hailey Hawkins is a 78 y.o. female with history of diabetes, hypertension, paroxysmal atrial fibrillation who presents with reported hypoglycemia.  EMS reports they were called out for altered mental status.  Found patient to be hypoglycemic with glucose in the 50s, quickly improved after given D5.  No neurodeficits.  Patient at her baseline currently although she reports feeling jittery .     Physical Exam   Triage Vital Signs: ED Triage Vitals  Encounter Vitals Group     BP 07/23/24 1042 (!) 178/103     Girls Systolic BP Percentile --      Girls Diastolic BP Percentile --      Boys Systolic BP Percentile --      Boys Diastolic BP Percentile --      Pulse Rate 07/23/24 1042 85     Resp 07/23/24 1042 20     Temp --      Temp src --      SpO2 07/23/24 1042 100 %     Weight 07/23/24 1047 78 kg (172 lb)     Height 07/23/24 1047 1.651 m (5' 5)     Head Circumference --      Peak Flow --      Pain Score 07/23/24 1046 0     Pain Loc --      Pain Education --      Exclude from Growth Chart --     Most recent vital signs: Vitals:   07/23/24 1300 07/23/24 1330  BP: (!) 157/89 (!) 174/84  Pulse: 87 80  Resp: 11 11  SpO2: 98% 100%     General: Awake, no distress.  CV:  Good peripheral perfusion.  Resp:  Normal effort.  Clear to auscultation bilaterally Abd:  No distention.  Soft, nontender Other:  Neurologic exam is reassuring, normal strength in all extremities, cranial nerves II through XII are normal.   ED Results / Procedures / Treatments   Labs (all labs ordered are listed, but only abnormal results are displayed) Labs Reviewed  COMPREHENSIVE METABOLIC PANEL WITH GFR - Abnormal; Notable for the following components:      Result Value   Glucose, Bld 131 (*)    Creatinine, Ser  <0.30 (*)    All other components within normal limits  URINALYSIS, ROUTINE W REFLEX MICROSCOPIC - Abnormal; Notable for the following components:   Color, Urine YELLOW (*)    APPearance CLEAR (*)    Glucose, UA 150 (*)    Ketones, ur 5 (*)    Leukocytes,Ua TRACE (*)    Bacteria, UA RARE (*)    All other components within normal limits  CBG MONITORING, ED - Abnormal; Notable for the following components:   Glucose-Capillary 116 (*)    All other components within normal limits  CBC  CBG MONITORING, ED  CBG MONITORING, ED  TROPONIN I (HIGH SENSITIVITY)     EKG  ED ECG REPORT I, Lamar Price, the attending physician, personally viewed and interpreted this ECG.  Date: 07/23/2024  Rhythm: Atrial fibrillation QRS Axis: normal Intervals: Abnormal ST/T Wave abnormalities: normal Narrative Interpretation: no evidence of acute ischemia    RADIOLOGY     PROCEDURES:  Critical Care performed:   Procedures   MEDICATIONS ORDERED IN ED: Medications  dextrose  50 %  solution 25 mL (25 mLs Intravenous Given 07/23/24 1050)     IMPRESSION / MDM / ASSESSMENT AND PLAN / ED COURSE  I reviewed the triage vital signs and the nursing notes. Patient's presentation is most consistent with acute presentation with potential threat to life or bodily function.  Patient presents after episode of hypoglycemia, review of records demonstrates she had her Lantus  adjusted slightly 1 week ago.  She reports her glucose monitor read over 300 last night so she gave herself additional insulin  but thinks maybe her monitor was incorrect.  Pending labs,  Lab work is reassuring, no infectious etiology noted.  Patient remains well-appearing we will continue to monitor her glucose  ----------------------------------------- 1:54 PM on 07/23/2024 ----------------------------------------- Patient's glucose has been stable for over 3 hours now, she is well-appearing and would like to go home.  No  indication for admission at this time, appropriate for discharge, close follow-up with PCP/endocrinologist, strict return precautions, she agrees to this plan.      FINAL CLINICAL IMPRESSION(S) / ED DIAGNOSES   Final diagnoses:  Hypoglycemia     Rx / DC Orders   ED Discharge Orders     None        Note:  This document was prepared using Dragon voice recognition software and may include unintentional dictation errors.   Arlander Charleston, MD 07/23/24 [phone]

## 2024-07-23 NOTE — ED Notes (Signed)
 Attempted to call patient's son, Diedre Maclellan, regarding patient's discharge and to arrange pick-up. No answer; unable to leave voicemail d/t voicemail not being set up.

## 2024-07-23 NOTE — ED Notes (Signed)
 Fall bundle in place - fall bracelet & non-skid socks placed on pt; bed alarm activated & audible.

## 2024-08-17 ENCOUNTER — Other Ambulatory Visit: Payer: Self-pay | Admitting: Internal Medicine

## 2024-08-17 DIAGNOSIS — Z1231 Encounter for screening mammogram for malignant neoplasm of breast: Secondary | ICD-10-CM

## 2024-09-08 ENCOUNTER — Encounter

## 2024-09-20 ENCOUNTER — Ambulatory Visit: Payer: Medicare PPO | Admitting: Dermatology
